# Patient Record
Sex: Female | Born: 1935
Health system: Southern US, Community
[De-identification: ages and names within clinical notes are randomized; demographics above are authoritative.]

## PROBLEM LIST (undated history)

## (undated) DIAGNOSIS — I252 Old myocardial infarction: Secondary | ICD-10-CM

## (undated) DIAGNOSIS — J301 Allergic rhinitis due to pollen: Secondary | ICD-10-CM

## (undated) DIAGNOSIS — I4891 Unspecified atrial fibrillation: Secondary | ICD-10-CM

## (undated) DIAGNOSIS — I2510 Atherosclerotic heart disease of native coronary artery without angina pectoris: Secondary | ICD-10-CM

## (undated) DIAGNOSIS — L509 Urticaria, unspecified: Secondary | ICD-10-CM

## (undated) DIAGNOSIS — I10 Essential (primary) hypertension: Secondary | ICD-10-CM

## (undated) DIAGNOSIS — I5032 Chronic diastolic (congestive) heart failure: Secondary | ICD-10-CM

## (undated) DIAGNOSIS — I480 Paroxysmal atrial fibrillation: Secondary | ICD-10-CM

## (undated) DIAGNOSIS — M1710 Unilateral primary osteoarthritis, unspecified knee: Secondary | ICD-10-CM

## (undated) DIAGNOSIS — I872 Venous insufficiency (chronic) (peripheral): Secondary | ICD-10-CM

## (undated) DIAGNOSIS — M169 Osteoarthritis of hip, unspecified: Secondary | ICD-10-CM

## (undated) DIAGNOSIS — L409 Psoriasis, unspecified: Secondary | ICD-10-CM

## (undated) DIAGNOSIS — H547 Unspecified visual loss: Secondary | ICD-10-CM

## (undated) DIAGNOSIS — I509 Heart failure, unspecified: Secondary | ICD-10-CM

## (undated) HISTORY — PX: REPLACEMENT TOTAL KNEE: SUR1224

## (undated) HISTORY — PX: APPENDECTOMY: SHX54

## (undated) HISTORY — DX: Chronic diastolic (congestive) heart failure: I50.32

## (undated) HISTORY — DX: Venous insufficiency (chronic) (peripheral): I87.2

## (undated) HISTORY — PX: FRACTURE SURGERY: SHX138

## (undated) HISTORY — DX: Paroxysmal atrial fibrillation: I48.0

## (undated) HISTORY — DX: Unspecified atrial fibrillation: I48.91

## (undated) HISTORY — DX: Urticaria, unspecified: L50.9

## (undated) HISTORY — DX: Old myocardial infarction: I25.2

## (undated) HISTORY — PX: REVISION TOTAL HIP ARTHROPLASTY: SHX766

## (undated) HISTORY — PX: OTHER SURGICAL HISTORY: SHX169

## (undated) HISTORY — DX: Unspecified visual loss: H54.7

## (undated) HISTORY — DX: Essential (primary) hypertension: I10

## (undated) HISTORY — DX: Osteoarthritis of hip, unspecified: M16.9

## (undated) HISTORY — DX: Atherosclerotic heart disease of native coronary artery without angina pectoris: I25.10

## (undated) HISTORY — DX: Psoriasis, unspecified: L40.9

## (undated) HISTORY — DX: Allergic rhinitis due to pollen: J30.1

## (undated) HISTORY — PX: PACEMAKER INSERTION: SHX728

## (undated) HISTORY — PX: LAPAROSCOPIC LYSIS OF ADHESIONS: SHX5905

## (undated) HISTORY — DX: Heart failure, unspecified: I50.9

## (undated) HISTORY — DX: Unilateral primary osteoarthritis, unspecified knee: M17.10

---

## 2008-07-09 DIAGNOSIS — I252 Old myocardial infarction: Secondary | ICD-10-CM

## 2008-07-09 HISTORY — DX: Old myocardial infarction: I25.2

## 2017-07-09 DIAGNOSIS — J209 Acute bronchitis, unspecified: Secondary | ICD-10-CM | POA: Diagnosis not present

## 2017-08-13 DIAGNOSIS — I252 Old myocardial infarction: Secondary | ICD-10-CM | POA: Diagnosis not present

## 2017-08-13 DIAGNOSIS — R0989 Other specified symptoms and signs involving the circulatory and respiratory systems: Secondary | ICD-10-CM | POA: Diagnosis not present

## 2017-08-14 ENCOUNTER — Telehealth: Payer: Self-pay | Admitting: *Deleted

## 2017-08-14 NOTE — Telephone Encounter (Signed)
REFERRAL SENT TO SCHEDULING.  °

## 2017-09-12 DIAGNOSIS — J301 Allergic rhinitis due to pollen: Secondary | ICD-10-CM

## 2017-09-12 DIAGNOSIS — M1710 Unilateral primary osteoarthritis, unspecified knee: Secondary | ICD-10-CM | POA: Insufficient documentation

## 2017-09-12 DIAGNOSIS — H547 Unspecified visual loss: Secondary | ICD-10-CM | POA: Insufficient documentation

## 2017-09-12 DIAGNOSIS — I10 Essential (primary) hypertension: Secondary | ICD-10-CM | POA: Insufficient documentation

## 2017-09-12 DIAGNOSIS — M169 Osteoarthritis of hip, unspecified: Secondary | ICD-10-CM | POA: Insufficient documentation

## 2017-09-12 DIAGNOSIS — I5032 Chronic diastolic (congestive) heart failure: Secondary | ICD-10-CM

## 2017-09-12 DIAGNOSIS — I872 Venous insufficiency (chronic) (peripheral): Secondary | ICD-10-CM | POA: Insufficient documentation

## 2017-09-12 DIAGNOSIS — L409 Psoriasis, unspecified: Secondary | ICD-10-CM | POA: Insufficient documentation

## 2017-09-12 DIAGNOSIS — M179 Osteoarthritis of knee, unspecified: Secondary | ICD-10-CM

## 2017-09-12 DIAGNOSIS — L509 Urticaria, unspecified: Secondary | ICD-10-CM

## 2017-09-12 DIAGNOSIS — I5042 Chronic combined systolic (congestive) and diastolic (congestive) heart failure: Secondary | ICD-10-CM | POA: Insufficient documentation

## 2017-09-12 DIAGNOSIS — I480 Paroxysmal atrial fibrillation: Secondary | ICD-10-CM | POA: Insufficient documentation

## 2017-09-12 HISTORY — DX: Unspecified visual loss: H54.7

## 2017-09-12 HISTORY — DX: Psoriasis, unspecified: L40.9

## 2017-09-12 HISTORY — DX: Paroxysmal atrial fibrillation: I48.0

## 2017-09-12 HISTORY — DX: Urticaria, unspecified: L50.9

## 2017-09-12 HISTORY — DX: Unilateral primary osteoarthritis, unspecified knee: M17.10

## 2017-09-12 HISTORY — DX: Allergic rhinitis due to pollen: J30.1

## 2017-09-12 HISTORY — DX: Chronic diastolic (congestive) heart failure: I50.32

## 2017-09-12 HISTORY — DX: Osteoarthritis of knee, unspecified: M17.9

## 2017-09-12 HISTORY — DX: Venous insufficiency (chronic) (peripheral): I87.2

## 2017-09-12 HISTORY — DX: Osteoarthritis of hip, unspecified: M16.9

## 2017-09-12 NOTE — Progress Notes (Signed)
Cardiology Office Note   Date:  09/13/2017   ID:  Dana Morrison, DOB 12/13/1923, MRN [id number]  PCP:  Soundra PilonBrake, Andrew R, FNP  Cardiologist:   Charlton HawsPeter Lockie Bothun, MD   No chief complaint on file.     History of Present Illness: Dana Morrison is a 84 y.o. female who presents for consultation regarding CAD. Referred by Meridee ScoreAndy Brake FNP [facility] [place] [facility] Indicates last stent placed January 2016 History of PAF on Multaq. No DAT due to need for anticoagulation with Eliquis Has Had PPM for SSS and is on statin for HLD   Her son is an Psychologist, educationalArt professor at [employer]. Two grand daughters here. She has no chest pain Indicates MI in 2010 with stents and repeat in 2016 Chronic dyspnea. Pacer placed 2015 with Some battery issue requiring revision after a year. She still drives and lives independantly Compliant with meds     Past Medical History:  Diagnosis Date  . Atrial fibrillation (HCC)   . Congestive heart failure (CHF) (HCC)    estimated ejection fraction is 54%; diastolic  . Coronary artery disease   . Hx of myocardial infarction   . Hypertension     Past Surgical History:  Procedure Laterality Date  . APPENDECTOMY    . ARM FRACTURE Left   . FRACTURE SURGERY Bilateral    WRISTS  . LAPAROSCOPIC LYSIS OF ADHESIONS    . PACEMAKER INSERTION    . PLACEMENT OF STENTS     X5  . REPLACEMENT TOTAL KNEE    . REVISION TOTAL HIP ARTHROPLASTY Left      Current Outpatient Medications  Medication Sig Dispense Refill  . apixaban (ELIQUIS) 2.5 MG TABS tablet Take 2.5 mg by mouth 2 (two) times daily.    Marland Kitchen. aspirin 81 MG chewable tablet Chew 1 tablet by mouth daily.    . bumetanide (BUMEX) 1 MG tablet Take 1 mg by mouth daily.    . cetirizine (ZYRTEC) 10 MG tablet Take 10 mg by mouth daily.    . clopidogrel (PLAVIX) 75 MG tablet Take 75 mg by mouth daily.    Marland Kitchen. dronedarone (MULTAQ) 400 MG tablet Take 400 mg by mouth 2 (two) times daily with a meal.    . isosorbide  mononitrate (IMDUR) 30 MG 24 hr tablet Take 30 mg by mouth daily.    . Metoprolol Succinate 25 MG CS24 Take by mouth.    . Multiple Vitamin (MULTIVITAMIN) capsule Take 1 capsule by mouth daily.    . nitroGLYCERIN (NITROSTAT) 0.4 MG SL tablet Place 0.4 mg under the tongue every 5 (five) minutes as needed for chest pain.    . potassium chloride SA (K-DUR,KLOR-CON) 20 MEQ tablet Take 20 mEq by mouth 2 (two) times daily.    . pravastatin (PRAVACHOL) 20 MG tablet Take 20 mg by mouth daily.    Marland Kitchen. Spacer/Aero-Holding Chambers [facility](OPTICHAMBER DIAMOND) MISC See admin instructions.  0   No current facility-administered medications for this visit.     Allergies:   Patient has no known allergies.    Social History:  The patient  reports that she has quit smoking. She does not have any smokeless tobacco history on file. She reports that she drinks alcohol. She reports that she does not use drugs.   Family History:  The patient's family history includes Healthy in her daughter, daughter, daughter, and son; Heart attack in her father and mother; Lung cancer in her brother; Microcephaly in her mother; Other  in her brother.    ROS:  Please see the history of present illness.   Otherwise, review of systems are positive for none.   All other systems are reviewed and negative.    PHYSICAL EXAM: VS:  BP 110/68   Pulse 76   Ht 5\' 7"  (1.702 m)   Wt 191 lb 4 oz (86.8 kg)   BMI 29.95 kg/m  , BMI Body mass index is 29.95 kg/m. Affect appropriate Elderly female  HEENT: normal Neck supple with no adenopathy JVP normal no bruits no thyromegaly Lungs clear with no wheezing and good diaphragmatic motion Heart:  S1/S2 no murmur, no rub, gallop or click PMI normal pacer under left clavicle  Abdomen: benighn, BS positve, no tenderness, no AAA no bruit.  No HSM or HJR Distal pulses intact with no bruits No edema Neuro non-focal Skin warm and dry Post left hip and knee replacement     EKG:  SR rate 76 PR 238  RBBB LAD PVC;s    Recent Labs: No results found for requested labs within last 8760 hours.    Lipid Panel No results found for: CHOL, TRIG, HDL, CHOLHDL, VLDL, LDLCALC, LDLDIRECT    Wt Readings from Last 3 Encounters:  09/13/17 191 lb 4 oz (86.8 kg)      Other studies Reviewed: Additional studies/ records that were reviewed today include: Notes from primary labs and will review notes from [place] .    ASSESSMENT AND PLAN:  1.  CAD no angina continue ASA and lopressor will try to get records from [place] 2. PAF in NSR continue low dose eliquis and multaq 3. HLD on statin labs with primary  4. PPM:  Will see pacer clinic today has Merlin device then f/u with EP 5. Dyspnea:  Not clear what EF is will order echo for RV/LV function especially Given PAF, Pacer and history of MI's    Current medicines are reviewed at length with the patient today.  The patient does not have concerns regarding medicines.  The following changes have been made:  no change  Labs/ tests ordered today include: Echo   Orders Placed This Encounter  Procedures  . EKG 12-Lead  . ECHOCARDIOGRAM COMPLETE     Disposition:   FU with EP next available and me in 3 months      Signed, Charlton Haws, MD  09/13/2017 10:46 AM    [facility] [address] [place], [place], [place]  [address] Phone: [phone]; Fax: [phone]

## 2017-09-13 ENCOUNTER — Ambulatory Visit: Payer: Medicare HMO | Admitting: Cardiovascular Disease

## 2017-09-13 ENCOUNTER — Encounter: Payer: Self-pay | Admitting: Cardiovascular Disease

## 2017-09-13 VITALS — BP 110/68 | HR 76 | Ht 67.0 in | Wt 191.2 lb

## 2017-09-13 DIAGNOSIS — M169 Osteoarthritis of hip, unspecified: Secondary | ICD-10-CM | POA: Diagnosis not present

## 2017-09-13 DIAGNOSIS — I872 Venous insufficiency (chronic) (peripheral): Secondary | ICD-10-CM

## 2017-09-13 DIAGNOSIS — R0600 Dyspnea, unspecified: Secondary | ICD-10-CM | POA: Diagnosis not present

## 2017-09-13 DIAGNOSIS — I480 Paroxysmal atrial fibrillation: Secondary | ICD-10-CM

## 2017-09-13 DIAGNOSIS — J301 Allergic rhinitis due to pollen: Secondary | ICD-10-CM

## 2017-09-13 DIAGNOSIS — M1710 Unilateral primary osteoarthritis, unspecified knee: Secondary | ICD-10-CM

## 2017-09-13 DIAGNOSIS — L409 Psoriasis, unspecified: Secondary | ICD-10-CM | POA: Diagnosis not present

## 2017-09-13 DIAGNOSIS — I10 Essential (primary) hypertension: Secondary | ICD-10-CM | POA: Diagnosis not present

## 2017-09-13 DIAGNOSIS — M179 Osteoarthritis of knee, unspecified: Secondary | ICD-10-CM

## 2017-09-13 DIAGNOSIS — H547 Unspecified visual loss: Secondary | ICD-10-CM | POA: Diagnosis not present

## 2017-09-13 DIAGNOSIS — L509 Urticaria, unspecified: Secondary | ICD-10-CM

## 2017-09-13 DIAGNOSIS — I5032 Chronic diastolic (congestive) heart failure: Secondary | ICD-10-CM

## 2017-09-13 NOTE — Patient Instructions (Addendum)
Medication Instructions:  Your physician recommends that you continue on your current medications as directed. Please refer to the Current Medication list given to you today.  Labwork: NONE  Testing/Procedures: Your physician has requested that you have an echocardiogram. Echocardiography is a painless test that uses sound waves to create images of your heart. It provides your doctor with information about the size and shape of your heart and how well your heart's chambers and valves are working. This procedure takes approximately one hour. There are no restrictions for this procedure.  Follow-Up: Your physician recommends that you schedule a follow-up appointment as new patient to establish with an EP doctor for pacemaker.  Your physician wants you to follow-up in: 3 months with Dr. Eden EmmsNishan.    If you need a refill on your cardiac medications before your next appointment, please call your pharmacy.

## 2017-09-19 DIAGNOSIS — Z9109 Other allergy status, other than to drugs and biological substances: Secondary | ICD-10-CM | POA: Diagnosis not present

## 2017-09-19 DIAGNOSIS — Z0000 Encounter for general adult medical examination without abnormal findings: Secondary | ICD-10-CM | POA: Diagnosis not present

## 2017-09-19 DIAGNOSIS — R198 Other specified symptoms and signs involving the digestive system and abdomen: Secondary | ICD-10-CM | POA: Diagnosis not present

## 2017-09-19 DIAGNOSIS — R202 Paresthesia of skin: Secondary | ICD-10-CM | POA: Diagnosis not present

## 2017-09-19 DIAGNOSIS — I4891 Unspecified atrial fibrillation: Secondary | ICD-10-CM | POA: Diagnosis not present

## 2017-09-19 DIAGNOSIS — E782 Mixed hyperlipidemia: Secondary | ICD-10-CM | POA: Diagnosis not present

## 2017-09-19 DIAGNOSIS — I252 Old myocardial infarction: Secondary | ICD-10-CM | POA: Diagnosis not present

## 2017-09-19 DIAGNOSIS — I10 Essential (primary) hypertension: Secondary | ICD-10-CM | POA: Diagnosis not present

## 2017-09-19 DIAGNOSIS — I509 Heart failure, unspecified: Secondary | ICD-10-CM | POA: Diagnosis not present

## 2017-09-20 ENCOUNTER — Ambulatory Visit (HOSPITAL_COMMUNITY): Payer: Medicare HMO | Attending: Cardiovascular Disease

## 2017-09-20 ENCOUNTER — Other Ambulatory Visit: Payer: Self-pay

## 2017-09-20 DIAGNOSIS — I4510 Unspecified right bundle-branch block: Secondary | ICD-10-CM | POA: Insufficient documentation

## 2017-09-20 DIAGNOSIS — I110 Hypertensive heart disease with heart failure: Secondary | ICD-10-CM | POA: Diagnosis not present

## 2017-09-20 DIAGNOSIS — Z0000 Encounter for general adult medical examination without abnormal findings: Secondary | ICD-10-CM | POA: Diagnosis not present

## 2017-09-20 DIAGNOSIS — I509 Heart failure, unspecified: Secondary | ICD-10-CM | POA: Insufficient documentation

## 2017-09-20 DIAGNOSIS — I4891 Unspecified atrial fibrillation: Secondary | ICD-10-CM | POA: Diagnosis not present

## 2017-09-20 DIAGNOSIS — I480 Paroxysmal atrial fibrillation: Secondary | ICD-10-CM | POA: Insufficient documentation

## 2017-09-20 DIAGNOSIS — E782 Mixed hyperlipidemia: Secondary | ICD-10-CM | POA: Diagnosis not present

## 2017-09-20 DIAGNOSIS — R0600 Dyspnea, unspecified: Secondary | ICD-10-CM | POA: Diagnosis not present

## 2017-09-20 DIAGNOSIS — I2510 Atherosclerotic heart disease of native coronary artery without angina pectoris: Secondary | ICD-10-CM | POA: Diagnosis not present

## 2017-09-20 DIAGNOSIS — I10 Essential (primary) hypertension: Secondary | ICD-10-CM | POA: Diagnosis not present

## 2017-09-20 DIAGNOSIS — I252 Old myocardial infarction: Secondary | ICD-10-CM | POA: Diagnosis not present

## 2017-09-20 MED ORDER — PERFLUTREN LIPID MICROSPHERE
1.0000 mL | INTRAVENOUS | Status: AC | PRN
  Administered 2017-09-20: 3 mL via INTRAVENOUS

## 2017-09-23 ENCOUNTER — Ambulatory Visit (INDEPENDENT_AMBULATORY_CARE_PROVIDER_SITE_OTHER): Payer: Medicare HMO | Admitting: *Deleted

## 2017-09-23 ENCOUNTER — Encounter: Payer: Self-pay | Admitting: Internal Medicine

## 2017-09-23 ENCOUNTER — Ambulatory Visit: Payer: Medicare HMO | Admitting: Internal Medicine

## 2017-09-23 ENCOUNTER — Other Ambulatory Visit: Payer: Self-pay

## 2017-09-23 VITALS — BP 126/66 | HR 72 | Ht 67.0 in | Wt 192.0 lb

## 2017-09-23 DIAGNOSIS — Z950 Presence of cardiac pacemaker: Secondary | ICD-10-CM | POA: Diagnosis not present

## 2017-09-23 DIAGNOSIS — I495 Sick sinus syndrome: Secondary | ICD-10-CM

## 2017-09-23 DIAGNOSIS — R202 Paresthesia of skin: Secondary | ICD-10-CM

## 2017-09-23 DIAGNOSIS — I480 Paroxysmal atrial fibrillation: Secondary | ICD-10-CM

## 2017-09-23 LAB — CUP PACEART INCLINIC DEVICE CHECK
Battery Remaining Longevity: 140 mo
Battery Voltage: 2.99 V
Brady Statistic RA Percent Paced: 12 %
Brady Statistic RV Percent Paced: 3.1 %
Date Time Interrogation Session: 20190318133639
Implantable Lead Implant Date: 20161230
Implantable Lead Implant Date: 20161230
Implantable Lead Location: 753859
Implantable Lead Location: 753860
Implantable Pulse Generator Implant Date: 20161230
Lead Channel Impedance Value: 475 Ohm
Lead Channel Impedance Value: 575 Ohm
Lead Channel Pacing Threshold Amplitude: 1 V
Lead Channel Pacing Threshold Amplitude: 1 V
Lead Channel Pacing Threshold Amplitude: 1.25 V
Lead Channel Pacing Threshold Amplitude: 1.25 V
Lead Channel Pacing Threshold Pulse Width: 0.4 ms
Lead Channel Pacing Threshold Pulse Width: 0.4 ms
Lead Channel Pacing Threshold Pulse Width: 0.4 ms
Lead Channel Pacing Threshold Pulse Width: 0.4 ms
Lead Channel Sensing Intrinsic Amplitude: 2.1 mV
Lead Channel Sensing Intrinsic Amplitude: 4.2 mV
Lead Channel Setting Pacing Amplitude: 2 V
Lead Channel Setting Pacing Amplitude: 2.5 V
Lead Channel Setting Pacing Pulse Width: 0.4 ms
Lead Channel Setting Sensing Sensitivity: 1 mV
Pulse Gen Model: 2240
Pulse Gen Serial Number: 7845839

## 2017-09-23 NOTE — Addendum Note (Signed)
Addended by: Micki RileySHOFFNER, Ambri Miltner C on: 09/23/2017 04:01 PM   Modules accepted: Orders

## 2017-09-23 NOTE — Patient Instructions (Signed)
Medication Instructions:  Your physician recommends that you continue on your current medications as directed. Please refer to the Current Medication list given to you today.  Labwork: None ordered.  Testing/Procedures: None ordered.  Follow-Up: Your physician wants you to follow-up in: one year with Dr. Ladona Ridgelaylor.   You will receive a reminder letter in the mail two months in advance. If you don't receive a letter, please call our office to schedule the follow-up appointment.  Remote monitoring is used to monitor your Pacemaker from home. This monitoring reduces the number of office visits required to check your device to one time per year. It allows us to keep an eye on the functioning of your device to ensure it is working properly. You are scheduled for a device check from home on 12/23/2017. You may send your transmission at any time that day. If you have a wireless device, the transmission will be sent automatically. After your physician reviews your transmission, you will receive a postcard with your next transmission date.  Any Other Special Instructions Will Be Listed Below (If Applicable).  If you need a refill on your cardiac medications before your next appointment, please call your pharmacy.

## 2017-09-23 NOTE — Progress Notes (Signed)
HPI Dana Morrison is referred today by Dr. Eden EmmsNishan for evaluation and management of her PPM andClinton Quant PAF. She is a pleasant 84 yo woman who moved from [place] about 3 months ago. She has done well and lived independently. She denies chest pain or sob. No syncope and no palpitations. She has a h/o chronotropic incontinence and underwent PPM insertion about 3 years ago. She takes dronenderone for PAF.  Allergies  Allergen Reactions  . Psyllium Diarrhea  . Gluten Meal     Per patient and her son, the need for a gluten free diet is due to some intolerances that she has had for many years. It is not an allergy.   . Other     Per patient she has an intolerance to some legumes. She does not have a true allergy. Pt's son confirmed this as well.      Current Outpatient Medications  Medication Sig Dispense Refill  . apixaban (ELIQUIS) 2.5 MG TABS tablet Take 2.5 mg by mouth 2 (two) times daily.    Marland Kitchen. aspirin 81 MG chewable tablet Chew 1 tablet by mouth daily.    . bumetanide (BUMEX) 1 MG tablet Take 1 mg by mouth daily.    . cetirizine (ZYRTEC) 10 MG tablet Take 10 mg by mouth daily.    . clopidogrel (PLAVIX) 75 MG tablet Take 75 mg by mouth daily.    Marland Kitchen. dronedarone (MULTAQ) 400 MG tablet Take 400 mg by mouth 2 (two) times daily with a meal.    . isosorbide mononitrate (IMDUR) 30 MG 24 hr tablet Take 30 mg by mouth daily.    . Metoprolol Succinate 25 MG CS24 Take by mouth.    . montelukast (SINGULAIR) 10 MG tablet Take 10 mg by mouth daily.  0  . Multiple Vitamin (MULTIVITAMIN) capsule Take 1 capsule by mouth daily.    . nitroGLYCERIN (NITROSTAT) 0.4 MG SL tablet Place 0.4 mg under the tongue every 5 (five) minutes as needed for chest pain.    . potassium chloride SA (K-DUR,KLOR-CON) 20 MEQ tablet Take 20 mEq by mouth 2 (two) times daily.    . pravastatin (PRAVACHOL) 20 MG tablet Take 20 mg by mouth daily.    Marland Kitchen. Spacer/Aero-Holding Chambers [facility](OPTICHAMBER DIAMOND) MISC See admin instructions.  0    No current facility-administered medications for this visit.      Past Medical History:  Diagnosis Date  . Atrial fibrillation (HCC)   . Chronic diastolic heart failure (HCC) 09/12/2017  . Congestive heart failure (CHF) (HCC)    estimated ejection fraction is 54%; diastolic  . Coronary artery disease   . Decreased vision 09/12/2017  . Hx of myocardial infarction 2010  . Hypertension   . Osteoarthritis of hip 09/12/2017  . Osteoarthritis of knee 09/12/2017  . Paroxysmal atrial fibrillation (HCC) 09/12/2017  . Scalp psoriasis 09/12/2017  . Seasonal allergic rhinitis due to pollen 09/12/2017  . Urticaria 09/12/2017  . Venous insufficiency 09/12/2017    ROS:   All systems reviewed and negative except as noted in the HPI.   Past Surgical History:  Procedure Laterality Date  . APPENDECTOMY    . ARM FRACTURE Left   . FRACTURE SURGERY Bilateral    WRISTS  . LAPAROSCOPIC LYSIS OF ADHESIONS    . PACEMAKER INSERTION    . PLACEMENT OF STENTS     X5  . REPLACEMENT TOTAL KNEE    . REVISION TOTAL HIP ARTHROPLASTY Left      Family  History  Problem Relation Age of Onset  . Microcephaly Mother   . Heart attack Mother   . Heart attack Father   . Lung cancer Brother   . Other Brother        BACK PROBLEMS  . Healthy Daughter   . Healthy Daughter   . Healthy Daughter   . Healthy Son      Social History   Socioeconomic History  . Marital status: Widowed    Spouse name: Not on file  . Number of children: Not on file  . Years of education: Not on file  . Highest education level: Not on file  Social Needs  . Financial resource strain: Not on file  . Food insecurity - worry: Not on file  . Food insecurity - inability: Not on file  . Transportation needs - medical: Not on file  . Transportation needs - non-medical: Not on file  Occupational History  . Not on file  Tobacco Use  . Smoking status: Former Games developer  . Smokeless tobacco: Never Used  Substance and Sexual Activity  .  Alcohol use: Yes    Comment: RARE  . Drug use: No  . Sexual activity: No  Other Topics Concern  . Not on file  Social History Narrative  . Not on file     BP 126/66   Pulse 72   Ht 5\' 7"  (1.702 m)   Wt 192 lb (87.1 kg)   BMI 30.07 kg/m   Physical Exam:  Well appearing 84 yo woman, NAD HEENT: Unremarkable Neck:  7 cm JVD, no thyromegally Lymphatics:  No adenopathy Back:  No CVA tenderness Lungs:  Clear with no wheezes HEART:  Regular rate rhythm, no murmurs, no rubs, no clicks Abd:  soft, positive bowel sounds, no organomegally, no rebound, no guarding Ext:  2 plus pulses, no edema, no cyanosis, no clubbing Skin:  No rashes no nodules Neuro:  CN II through XII intact, motor grossly intact  EKG - NSR with first degree AV block and RBBB  DEVICE  Normal device function.  See PaceArt for details.   Assess/Plan: 1. Sinus node dysfunction - she is s/p PPM and is asymptomatic. 2. PAF - she is maintaining NSR over 98% of the time. She will continue on dronenderone. 3. PPM - her St. Jude device is working normally. Will recheck in several months.  Leonia Reeves.D.

## 2017-09-23 NOTE — Progress Notes (Signed)
Pacemaker check in clinic. Normal device function. Thresholds, sensing, impedances consistent with previous measurements. Device programmed to maximize longevity. 1.9% AT/AF- known PAF, on Eliquis. 1 high ventricular rate- 1:1 @ 155bpm, 66 sec. Device programmed at appropriate safety margins, outputs set per policy. Histogram distribution appropriate for patient activity level. Device programmed to optimize intrinsic conduction. Estimated longevity 9.7-11.7 years. Patient enrolled in remote follow-up. Merlin 12/23/2017, ROV with GT in 1 yr. Merlin transfer requested.

## 2017-11-15 ENCOUNTER — Ambulatory Visit (HOSPITAL_COMMUNITY)
Admission: RE | Admit: 2017-11-15 | Discharge: 2017-11-15 | Disposition: A | Discharge status: Home / Self Care | Payer: Medicare HMO | Source: Ambulatory Visit | Attending: Vascular Surgery | Admitting: Vascular Surgery

## 2017-11-15 ENCOUNTER — Ambulatory Visit: Payer: Medicare HMO | Admitting: Vascular Surgery

## 2017-11-15 ENCOUNTER — Encounter: Payer: Self-pay | Admitting: Vascular Surgery

## 2017-11-15 ENCOUNTER — Other Ambulatory Visit: Payer: Self-pay

## 2017-11-15 VITALS — BP 116/64 | HR 72 | Temp 97.7°F | Resp 20 | Ht 67.0 in | Wt 189.0 lb

## 2017-11-15 DIAGNOSIS — R600 Localized edema: Secondary | ICD-10-CM | POA: Diagnosis not present

## 2017-11-15 DIAGNOSIS — R202 Paresthesia of skin: Secondary | ICD-10-CM | POA: Insufficient documentation

## 2017-11-15 NOTE — Progress Notes (Signed)
Patient ID: Dana Morrison, female   DOB: 1924/02/07, 84 y.o.   MRN: [id number]  Reason for Consult: New Patient (Initial Visit) (Ref by Meridee Score, NP.  [phone].  Bilat below the knee tingling and numbness. )   Referred by Soundra Pilon, FNP  Subjective:     HPI:  Dana Morrison is a 84 y.o. female sent for evaluation of bilateral lower extremity numbness and tingling that is gotten progressively worse over the past several months.  She is recently moved from [place].  She has a history of a left knee replacement.  She does not have any previous vascular intervention on her lower extremity but does have previous history of myocardial infarction for which she has chronic shortness of breath which limits her activity.  She does attempt to elevate her legs when she is recumbent given that her leg swelling is also an issue.  She is never had vein stripping on either of her legs or other venous interventions.  She does have a diagnosis of congestive heart failure for which she does take a diuretic daily which helps minimally with her swelling.  She does not have any tissue loss or ulceration.  She does have spider veins which she does not care about the cosmesis of the level of her ankles.  She is able to walk with the help of a walker.  She does take aspirin as well as Eliquis for atrial fibrillation.  Past Medical History:  Diagnosis Date  . Atrial fibrillation (HCC)   . Chronic diastolic heart failure (HCC) 09/12/2017  . Congestive heart failure (CHF) (HCC)    estimated ejection fraction is 54%; diastolic  . Coronary artery disease   . Decreased vision 09/12/2017  . Hx of myocardial infarction 2010  . Hypertension   . Osteoarthritis of hip 09/12/2017  . Osteoarthritis of knee 09/12/2017  . Paroxysmal atrial fibrillation (HCC) 09/12/2017  . Scalp psoriasis 09/12/2017  . Seasonal allergic rhinitis due to pollen 09/12/2017  . Urticaria 09/12/2017  . Venous insufficiency 09/12/2017   Family History    Problem Relation Age of Onset  . Microcephaly Mother   . Heart attack Mother   . Heart attack Father   . Lung cancer Brother   . Other Brother        BACK PROBLEMS  . Healthy Daughter   . Healthy Daughter   . Healthy Daughter   . Healthy Son    Past Surgical History:  Procedure Laterality Date  . APPENDECTOMY    . ARM FRACTURE Left   . FRACTURE SURGERY Bilateral    WRISTS  . LAPAROSCOPIC LYSIS OF ADHESIONS    . PACEMAKER INSERTION    . PLACEMENT OF STENTS     X5  . REPLACEMENT TOTAL KNEE    . REVISION TOTAL HIP ARTHROPLASTY Left     Short Social History:  Social History   Tobacco Use  . Smoking status: Former Games developer  . Smokeless tobacco: Never Used  Substance Use Topics  . Alcohol use: Yes    Comment: RARE    Allergies  Allergen Reactions  . Psyllium Diarrhea  . Gluten Meal     Per patient and her son, the need for a gluten free diet is due to some intolerances that she has had for many years. It is not an allergy.   . Other     Per patient she has an intolerance to some legumes. She does not have a true allergy. Pt's son confirmed this  as well.     Current Outpatient Medications  Medication Sig Dispense Refill  . apixaban (ELIQUIS) 2.5 MG TABS tablet Take 2.5 mg by mouth 2 (two) times daily.    Marland Kitchen aspirin 81 MG chewable tablet Chew 1 tablet by mouth daily.    . bumetanide (BUMEX) 1 MG tablet Take 1 mg by mouth daily.    . clopidogrel (PLAVIX) 75 MG tablet Take 75 mg by mouth daily.    Marland Kitchen dronedarone (MULTAQ) 400 MG tablet Take 400 mg by mouth 2 (two) times daily with a meal.    . isosorbide mononitrate (IMDUR) 30 MG 24 hr tablet Take 30 mg by mouth daily.    . Metoprolol Succinate 25 MG CS24 Take by mouth.    . montelukast (SINGULAIR) 10 MG tablet Take 10 mg by mouth daily.  0  . Multiple Vitamin (MULTIVITAMIN) capsule Take 1 capsule by mouth daily.    . nitroGLYCERIN (NITROSTAT) 0.4 MG SL tablet Place 0.4 mg under the tongue every 5 (five) minutes as needed  for chest pain.    . potassium chloride SA (K-DUR,KLOR-CON) 20 MEQ tablet Take 20 mEq by mouth 2 (two) times daily.    . pravastatin (PRAVACHOL) 20 MG tablet Take 20 mg by mouth daily.    Marland Kitchen Spacer/Aero-Holding Chambers [facility] DIAMOND) MISC See admin instructions.  0   No current facility-administered medications for this visit.     Review of Systems  Constitutional:  Constitutional negative. HENT: HENT negative.  Eyes: Eyes negative.  Respiratory: Positive for shortness of breath.  Cardiovascular: Positive for irregular heartbeat.  GI: Gastrointestinal negative.  Musculoskeletal: Positive for leg pain.  Neurological: Positive for numbness.  Hematologic: Positive for bruises/bleeds easily.  Psychiatric: Psychiatric negative.        Objective:  Objective   Vitals:   11/15/17 0932  BP: 116/64  Pulse: 72  Resp: 20  Temp: 97.7 F (36.5 C)  TempSrc: Oral  SpO2: 98%  Weight: 189 lb (85.7 kg)  Height:  (1.702 m)   Body mass index is 29.6 kg/m.  Physical Exam  Constitutional: She is oriented to person, place, and time. She appears well-developed.  HENT:  Head: Normocephalic.  Eyes: Pupils are equal, round, and reactive to light.  Neck: Normal range of motion.  Cardiovascular:  Pulses:      Popliteal pulses are 2+ on the right side, and 2+ on the left side.       Dorsalis pedis pulses are 2+ on the right side, and 2+ on the left side.       Posterior tibial pulses are 2+ on the right side, and 2+ on the left side.  Pulmonary/Chest:  Shortness of breath, no home O2  Abdominal: Soft. She exhibits no mass.  Musculoskeletal: She exhibits edema.  1+ pitting edema, left > right Spider veins at ankles  Neurological: She is alert and oriented to person, place, and time.  Skin: Skin is warm and dry. Capillary refill takes less than 2 seconds. No erythema.  Psychiatric: She has a normal mood and affect. Her behavior is normal. Judgment and thought content normal.     Data: I have independently interpreted her ABIs to be triphasic right 0.98 left is 1.09 with toe pressures on the right 141 left 160     Assessment/Plan:     84 year old female with history of coronary artery disease congestive heart failure but remains active walking with the help of walker.  She has numbness and tingling in her  bilateral lower extremities with associated edema which she does take a diuretic for.  This time I think conservative measures are best for her including elevation of her legs when recumbent we discussed this in detail.  She has difficulty with compression stockings with any gentle compression would be helpful.  Activity would also be helpful for her.  From an arterial standpoint she appears to be intact and does not need any intervention.  She can follow-up on a as needed basis.     Maeola Harman MD [facility]

## 2017-12-09 ENCOUNTER — Encounter: Payer: Self-pay | Admitting: Cardiovascular Disease

## 2017-12-09 ENCOUNTER — Ambulatory Visit: Payer: Medicare HMO | Admitting: Cardiovascular Disease

## 2017-12-09 ENCOUNTER — Encounter (INDEPENDENT_AMBULATORY_CARE_PROVIDER_SITE_OTHER): Payer: Self-pay

## 2017-12-09 VITALS — BP 112/84 | HR 71 | Ht 67.0 in | Wt 190.5 lb

## 2017-12-09 DIAGNOSIS — R0600 Dyspnea, unspecified: Secondary | ICD-10-CM | POA: Diagnosis not present

## 2017-12-09 DIAGNOSIS — I480 Paroxysmal atrial fibrillation: Secondary | ICD-10-CM

## 2017-12-09 DIAGNOSIS — I2510 Atherosclerotic heart disease of native coronary artery without angina pectoris: Secondary | ICD-10-CM

## 2017-12-09 DIAGNOSIS — E785 Hyperlipidemia, unspecified: Secondary | ICD-10-CM | POA: Diagnosis not present

## 2017-12-09 NOTE — Progress Notes (Signed)
Cardiology Office Note   Date:  12/09/2017   ID:  Dana Morrison, DOB 06/01/1924, MRN [id number]  PCP:  Soundra PilonBrake, Andrew R, FNP  Cardiologist:   Charlton HawsPeter Khoa Opdahl, MD   No chief complaint on file.     History of Present Illness:  84 y.o. from [place]. Stent January 2016, PAF , PPM and HLD  Her son is an Engineer, drillingArt professor at [employer]. Two grand daughters here. She has no chest pain Indicates MI in 2010 with stents and repeat in 2016 Chronic dyspnea. Pacer placed 2015 has seen dr Ladona Ridgelaylor for pacer check  She still drives and lives independently  Compliant with meds   TTE 09/20/17  EF 55-60%  Estimated PA 35 mmHg no significant valve disease   Some LE edema dependant worse on RLE recent arterial doppler normal   Past Medical History:  Diagnosis Date  . Atrial fibrillation (HCC)   . Chronic diastolic heart failure (HCC) 09/12/2017  . Congestive heart failure (CHF) (HCC)    estimated ejection fraction is 54%; diastolic  . Coronary artery disease   . Decreased vision 09/12/2017  . Hx of myocardial infarction 2010  . Hypertension   . Osteoarthritis of hip 09/12/2017  . Osteoarthritis of knee 09/12/2017  . Paroxysmal atrial fibrillation (HCC) 09/12/2017  . Scalp psoriasis 09/12/2017  . Seasonal allergic rhinitis due to pollen 09/12/2017  . Urticaria 09/12/2017  . Venous insufficiency 09/12/2017    Past Surgical History:  Procedure Laterality Date  . APPENDECTOMY    . ARM FRACTURE Left   . FRACTURE SURGERY Bilateral    WRISTS  . LAPAROSCOPIC LYSIS OF ADHESIONS    . PACEMAKER INSERTION    . PLACEMENT OF STENTS     X5  . REPLACEMENT TOTAL KNEE    . REVISION TOTAL HIP ARTHROPLASTY Left      Current Outpatient Medications  Medication Sig Dispense Refill  . apixaban (ELIQUIS) 2.5 MG TABS tablet Take 2.5 mg by mouth 2 (two) times daily.    Marland Kitchen. aspirin 81 MG chewable tablet Chew 1 tablet by mouth daily.    . bumetanide (BUMEX) 1 MG tablet Take 1 mg by mouth daily.    . clopidogrel (PLAVIX) 75 MG tablet  Take 75 mg by mouth daily.    Marland Kitchen. dronedarone (MULTAQ) 400 MG tablet Take 400 mg by mouth 2 (two) times daily with a meal.    . isosorbide mononitrate (IMDUR) 30 MG 24 hr tablet Take 30 mg by mouth daily.    . Metoprolol Succinate 25 MG CS24 Take 1 tablet by mouth daily.    . montelukast (SINGULAIR) 10 MG tablet Take 10 mg by mouth daily.  0  . Multiple Vitamin (MULTIVITAMIN) capsule Take 1 capsule by mouth daily.    . nitroGLYCERIN (NITROSTAT) 0.4 MG SL tablet Place 0.4 mg under the tongue every 5 (five) minutes as needed for chest pain.    . potassium chloride SA (K-DUR,KLOR-CON) 20 MEQ tablet Take 20 mEq by mouth 2 (two) times daily.    . pravastatin (PRAVACHOL) 20 MG tablet Take 20 mg by mouth daily.     No current facility-administered medications for this visit.     Allergies:   Psyllium; Gluten meal; and Other    Social History:  The patient  reports that she has quit smoking. She has never used smokeless tobacco. She reports that she drinks alcohol. She reports that she does not use drugs.   Family History:  The patient's family history includes Healthy  in her daughter, daughter, daughter, and son; Heart attack in her father and mother; Lung cancer in her brother; Microcephaly in her mother; Other in her brother.    ROS:  Please see the history of present illness.   Otherwise, review of systems are positive for none.   All other systems are reviewed and negative.    PHYSICAL EXAM: VS:  BP 112/84   Pulse 71   Ht 5\' 7"  (1.702 m)   Wt 190 lb 8 oz (86.4 kg)   SpO2 95%   BMI 29.84 kg/m  , BMI Body mass index is 29.84 kg/m. Affect appropriate Elderly female  HEENT: normal Neck supple with no adenopathy JVP normal no bruits no thyromegaly Lungs clear with no wheezing and good diaphragmatic motion Heart:  S1/S2 no murmur, no rub, gallop or click PMI normal pacer under left clavicle  Abdomen: benighn, BS positve, no tenderness, no AAA no bruit.  No HSM or HJR Distal pulses  intact with no bruits Plus 2 on right edema plus one on left  Neuro non-focal Skin warm and dry Post left hip and knee replacement     EKG:  SR rate 76 PR 238 RBBB LAD PVC;s    Recent Labs: No results found for requested labs within last 8760 hours.    Lipid Panel No results found for: CHOL, TRIG, HDL, CHOLHDL, VLDL, LDLCALC, LDLDIRECT    Wt Readings from Last 3 Encounters:  12/09/17 190 lb 8 oz (86.4 kg)  11/15/17 189 lb (85.7 kg)  09/23/17 192 lb (87.1 kg)      Other studies Reviewed: Additional studies/ records that were reviewed today include: Notes from primary labs and will review notes from [place] .    ASSESSMENT AND PLAN:  1.  CAD no angina continue ASA and lopressor   2. PAF in NSR continue low dose eliquis and multaq 3. HLD on statin labs with primary  4. PPM:  Normal pacer function f/u Dr Ladona Ridgel  5. Dyspnea:  No cardiac EF 55-60% TTE 09/20/17  6. Edema:  Right worse than left Bumex as needed extra [place] r/o DVT on right   Current medicines are reviewed at length with the patient today.  The patient does not have concerns regarding medicines.  The following changes have been made:  no change   No orders of the defined types were placed in this encounter.    Disposition:   FU with EP next available and me in 3 months      Signed, Charlton Haws, MD  12/09/2017 11:29 AM    [facility] [address] [place], [place], [place]  [address] Phone: [phone]; Fax: [phone]

## 2017-12-09 NOTE — Patient Instructions (Addendum)
Medication Instructions:  Your physician recommends that you continue on your current medications as directed. Please refer to the Current Medication list given to you today.  Labwork: NONE  Testing/Procedures: NONE  Follow-Up: Your physician wants you to follow-up in: 15 months with Dr. Eden EmmsNishan. You will receive a reminder letter in the mail two months in advance. If you don't receive a letter, please call our office to schedule the follow-up appointment.   If you need a refill on your cardiac medications before your next appointment, please call your pharmacy.

## 2017-12-23 ENCOUNTER — Encounter: Payer: Medicare HMO | Admitting: *Deleted

## 2018-01-14 ENCOUNTER — Telehealth: Payer: Self-pay | Admitting: Cardiology

## 2018-01-14 ENCOUNTER — Ambulatory Visit (INDEPENDENT_AMBULATORY_CARE_PROVIDER_SITE_OTHER): Payer: Medicare HMO | Admitting: *Deleted

## 2018-01-14 DIAGNOSIS — I495 Sick sinus syndrome: Secondary | ICD-10-CM | POA: Diagnosis not present

## 2018-01-14 NOTE — Telephone Encounter (Signed)
Spoke with pt and reminded pt of remote transmission that is due today. Pt verbalized understanding.   

## 2018-01-15 NOTE — Progress Notes (Signed)
Remote pacemaker transmission.   

## 2018-01-23 ENCOUNTER — Other Ambulatory Visit: Payer: Self-pay

## 2018-01-23 DIAGNOSIS — R198 Other specified symptoms and signs involving the digestive system and abdomen: Secondary | ICD-10-CM | POA: Diagnosis not present

## 2018-01-23 DIAGNOSIS — R6889 Other general symptoms and signs: Secondary | ICD-10-CM | POA: Diagnosis not present

## 2018-01-23 DIAGNOSIS — Z9109 Other allergy status, other than to drugs and biological substances: Secondary | ICD-10-CM | POA: Diagnosis not present

## 2018-01-23 MED ORDER — BUMETANIDE 1 MG PO TABS: 1.0000 mg | ORAL_TABLET | Freq: Every day | ORAL | 3 refills | Status: DC

## 2018-01-28 LAB — CUP PACEART REMOTE DEVICE CHECK
Battery Remaining Longevity: 128 mo
Battery Remaining Percentage: 95.5 %
Battery Voltage: 2.98 V
Brady Statistic AP VP Percent: 1 %
Brady Statistic AP VS Percent: 6.5 %
Brady Statistic AS VP Percent: 1 %
Brady Statistic AS VS Percent: 91 %
Brady Statistic RA Percent Paced: 6.5 %
Brady Statistic RV Percent Paced: 2.7 %
Date Time Interrogation Session: 20190709180739
Implantable Lead Implant Date: 20161230
Implantable Lead Implant Date: 20161230
Implantable Lead Location: 753859
Implantable Lead Location: 753860
Implantable Pulse Generator Implant Date: 20161230
Lead Channel Impedance Value: 460 Ohm
Lead Channel Impedance Value: 550 Ohm
Lead Channel Pacing Threshold Amplitude: 1 V
Lead Channel Pacing Threshold Amplitude: 1.25 V
Lead Channel Pacing Threshold Pulse Width: 0.4 ms
Lead Channel Pacing Threshold Pulse Width: 0.4 ms
Lead Channel Sensing Intrinsic Amplitude: 3 mV
Lead Channel Sensing Intrinsic Amplitude: 6.5 mV
Lead Channel Setting Pacing Amplitude: 2 V
Lead Channel Setting Pacing Amplitude: 2.5 V
Lead Channel Setting Pacing Pulse Width: 0.4 ms
Lead Channel Setting Sensing Sensitivity: 1 mV
Pulse Gen Model: 2240
Pulse Gen Serial Number: 7845839

## 2018-01-30 ENCOUNTER — Other Ambulatory Visit: Payer: Self-pay

## 2018-01-30 MED ORDER — CLOPIDOGREL BISULFATE 75 MG PO TABS: 75.0000 mg | ORAL_TABLET | Freq: Every day | ORAL | 0 refills | Status: DC

## 2018-01-30 MED ORDER — METOPROLOL SUCCINATE 25 MG PO CS24: 1.0000 | EXTENDED_RELEASE_CAPSULE | Freq: Every day | ORAL | 0 refills | Status: DC

## 2018-01-30 MED ORDER — DRONEDARONE HCL 400 MG PO TABS: 400.0000 mg | ORAL_TABLET | Freq: Two times a day (BID) | ORAL | 0 refills | Status: DC

## 2018-01-30 NOTE — Telephone Encounter (Signed)
Eliquis 2.5mg  refill request received; pt is 84 yrs old, wt-86.4kg, last seen by Dr. Eden EmmsNishan on 12/09/17, Crea-1.09 on 05/18/17; will send a note to Dr. Eden EmmsNishan as dose is not appropriate and will ned him to authorized refill.

## 2018-01-31 MED ORDER — APIXABAN 2.5 MG PO TABS: 2.5000 mg | ORAL_TABLET | Freq: Two times a day (BID) | ORAL | 1 refills | Status: DC

## 2018-01-31 MED ORDER — PRAVASTATIN SODIUM 20 MG PO TABS: 20.0000 mg | ORAL_TABLET | Freq: Every day | ORAL | 3 refills | Status: DC

## 2018-01-31 NOTE — Telephone Encounter (Signed)
-----   Message from Wendall StadePeter C Nishan, MD sent at 01/30/2018  4:52 PM EDT ----- She is also on plavix for CAD and prefer low dose   ----- Message ----- From: Kandace ParkinsHemphill, Audray Rumore B, RN Sent: 01/30/2018   4:22 PM To: Wendall StadePeter C Nishan, MD  Dr. Eden EmmsNishan,  Eliquis 2.5mg  refill request received; pt is 84 yrs old, wt-86.4kg, last seen by you on 12/09/17, Crea-1.09 on 05/18/17 per CareEverywhere. Per dosing criteria pt is on incorrect dose. Please advise.  Thanks, Pamala Hurryandance Roen Macgowan, RN

## 2018-01-31 NOTE — Telephone Encounter (Signed)
Received reply from Dr. Eden EmmsNishan regarding Eliquis dose and pt to remain on Eliquis 2.5mg . Will send in refill to requested pharmacy.

## 2018-02-04 ENCOUNTER — Telehealth: Payer: Self-pay | Admitting: *Deleted

## 2018-02-04 ENCOUNTER — Other Ambulatory Visit: Payer: Self-pay | Admitting: Cardiovascular Disease

## 2018-02-04 NOTE — Telephone Encounter (Signed)
-----   Message from Wendall StadePeter C Nishan, MD sent at 01/30/2018  4:52 PM EDT ----- She is also on plavix for CAD and prefer low dose   ----- Message ----- From: Kandace ParkinsHemphill, Malcolm Quast B, RN Sent: 01/30/2018   4:22 PM To: Wendall StadePeter C Nishan, MD  Dr. Eden EmmsNishan,  Eliquis 2.5mg  refill request received; pt is 84 yrs old, wt-86.4kg, last seen by you on 12/09/17, Crea-1.09 on 05/18/17 per CareEverywhere. Per dosing criteria pt is on incorrect dose. Please advise.  Thanks, Pamala Hurryandance Jerline Linzy, RN

## 2018-02-05 ENCOUNTER — Telehealth: Payer: Self-pay | Admitting: Internal Medicine

## 2018-02-05 ENCOUNTER — Other Ambulatory Visit: Payer: Self-pay | Admitting: Cardiovascular Disease

## 2018-02-05 MED ORDER — DRONEDARONE HCL 400 MG PO TABS: 400.0000 mg | ORAL_TABLET | Freq: Two times a day (BID) | ORAL | 0 refills | Status: DC

## 2018-02-05 MED ORDER — DRONEDARONE HCL 400 MG PO TABS: 400.0000 mg | ORAL_TABLET | Freq: Two times a day (BID) | ORAL | 3 refills | Status: DC

## 2018-02-05 MED ORDER — APIXABAN 2.5 MG PO TABS: 2.5000 mg | ORAL_TABLET | Freq: Two times a day (BID) | ORAL | 0 refills | Status: DC

## 2018-02-05 MED ORDER — CLOPIDOGREL BISULFATE 75 MG PO TABS: 75.0000 mg | ORAL_TABLET | Freq: Every day | ORAL | 0 refills | Status: DC

## 2018-02-05 MED ORDER — POTASSIUM CHLORIDE CRYS ER 20 MEQ PO TBCR: 20.0000 meq | EXTENDED_RELEASE_TABLET | Freq: Two times a day (BID) | ORAL | 3 refills | Status: DC

## 2018-02-05 MED ORDER — ISOSORBIDE MONONITRATE ER 30 MG PO TB24: 30.0000 mg | ORAL_TABLET | Freq: Every day | ORAL | 3 refills | Status: DC

## 2018-02-05 MED ORDER — CLOPIDOGREL BISULFATE 75 MG PO TABS: 75.0000 mg | ORAL_TABLET | Freq: Every day | ORAL | 3 refills | Status: DC

## 2018-02-05 MED ORDER — ISOSORBIDE MONONITRATE ER 30 MG PO TB24: 30.0000 mg | ORAL_TABLET | Freq: Every day | ORAL | 0 refills | Status: DC

## 2018-02-05 MED ORDER — POTASSIUM CHLORIDE CRYS ER 20 MEQ PO TBCR: 20.0000 meq | EXTENDED_RELEASE_TABLET | Freq: Two times a day (BID) | ORAL | 0 refills | Status: DC

## 2018-02-05 MED ORDER — METOPROLOL SUCCINATE 25 MG PO CS24: 1.0000 | EXTENDED_RELEASE_CAPSULE | Freq: Every day | ORAL | 0 refills | Status: DC

## 2018-02-05 NOTE — Telephone Encounter (Signed)
Pt needs [facility] to [facility], 2 weeks supply until mail order arrives. Thanks pt's other medications were already sent to pt's pharmacy for 2 weeks supply until mail order arrives. Confirmation received.

## 2018-02-05 NOTE — Telephone Encounter (Signed)
New Message:      *STAT* If patient is at the pharmacy, call can be transferred to refill team.   1. Which medications need to be refilled? (please list name of each medication and dose if known)  apixaban (ELIQUIS) 2.5 MG TABS tablet  clopidogrel (PLAVIX) 75 MG tablet  dronedarone (MULTAQ) 400 MG tablet  Metoprolol in place of [place] sprinkle  isosorbide mononitrate (IMDUR) 30 MG 24 hr tablet   potassium chloride SA (K-DUR,KLOR-CON) 20 MEQ tablet    2. Which pharmacy/location (including street and city if local pharmacy) is medication to be sent to?[id number] [place] [place] - [address]  3. Do they need a 30 day or 90 day supply? Pt states she only 2 wks worth until [facility]

## 2018-02-06 ENCOUNTER — Other Ambulatory Visit: Payer: Self-pay

## 2018-02-06 MED ORDER — METOPROLOL SUCCINATE ER 25 MG PO TB24: 25.0000 mg | ORAL_TABLET | Freq: Every day | ORAL | 3 refills | Status: DC

## 2018-02-06 NOTE — Progress Notes (Signed)
Patient needs metoprolol succinate tablets, not sprinkles.

## 2018-02-27 ENCOUNTER — Ambulatory Visit: Payer: Medicare HMO | Admitting: Allergy

## 2018-02-27 ENCOUNTER — Encounter: Payer: Self-pay | Admitting: Allergy

## 2018-02-27 ENCOUNTER — Encounter (INDEPENDENT_AMBULATORY_CARE_PROVIDER_SITE_OTHER): Payer: Self-pay

## 2018-02-27 VITALS — BP 116/68 | HR 71 | Temp 97.4°F | Resp 17 | Ht 67.0 in | Wt 191.2 lb

## 2018-02-27 DIAGNOSIS — J310 Chronic rhinitis: Secondary | ICD-10-CM | POA: Diagnosis not present

## 2018-02-27 DIAGNOSIS — T781XXA Other adverse food reactions, not elsewhere classified, initial encounter: Secondary | ICD-10-CM

## 2018-02-27 DIAGNOSIS — L299 Pruritus, unspecified: Secondary | ICD-10-CM

## 2018-02-27 MED ORDER — CARBINOXAMINE MALEATE 6 MG PO TABS: 6.0000 mg | ORAL_TABLET | Freq: Two times a day (BID) | ORAL | 1 refills | Status: DC

## 2018-02-27 MED ORDER — AZELASTINE HCL 0.1 % NA SOLN: 2.0000 | Freq: Two times a day (BID) | NASAL | 5 refills | Status: DC

## 2018-02-27 NOTE — Progress Notes (Signed)
New Patient Note  RE: Dana Morrison MRN: [id number] DOB: August 14, 1923 Date of Office Visit: 02/27/2018  Referring provider: Soundra Pilon, FNP Primary care provider: Soundra Pilon, FNP  Chief Complaint: itching and allergies  History of present illness: Dana Morrison is a 84 y.o. female presenting today for consultation for itching and allergies.  She presents today with her daugther-in-law.   She has been itching all over worse on her face for the past year.  She has never noted a rash with the itch.  She also has been coughing due to runny nose, sneezing, itchy eyes.  Symptoms are year-round.  She lives alone.  She has not had any recent travel.  She denies any changes to her medication.  She denies any fevers, night sweats, changes in weight.    She moved from [place] to [place] area in January 2019.  Her previous doctor in [place] for the itch has tried antibiotics, singulair, zyrtec, claritin as well as allegra and xyzal which none of these have helped. She states she lotions with lubriderm 1-2 times a day and soap including oatmeal soap and aloe.  She does not have a history of eczema.   For here nasal drainage in addition to the medications above she has also tried simply saline, flonase, nasacort.  Has not tried any prescription nasal sprays. She states she has also used eyedrops for her itchy eyes.    Over the years has required a restricted diet.  Daguther-in-law thinks she may have IBS.  She did see GI while in [place] which did not reveal particular diagnosis.  She states she has to avoid certain foods as they cause diarrhea that can occur within minutes-hours or even start the following day after ingestion.  The foods she avoids are leafy greens, rice, white flour/wheat flour (potato flour is ok), nuts, berries, black bean/small beans (pinto beans are ok), high fiber foods, apples, banana, kiwi (citrus fruits are ok).     Review of systems: Review of Systems  Constitutional: Negative  for chills, fever, malaise/fatigue and weight loss.  HENT: Positive for congestion. Negative for ear discharge, ear pain, nosebleeds and sore throat.   Eyes: Negative for discharge and redness.  Respiratory: Positive for cough. Negative for sputum production, shortness of breath and wheezing.   Cardiovascular: Negative for chest pain.  Gastrointestinal: Negative for abdominal pain, constipation, diarrhea, nausea and vomiting.  Musculoskeletal: Negative for joint pain.  Skin: Positive for itching. Negative for rash.  Neurological: Negative for headaches.    All other systems negative unless noted above in HPI  Past medical history: Past Medical History:  Diagnosis Date  . Atrial fibrillation (HCC)   . Chronic diastolic heart failure (HCC) 09/12/2017  . Congestive heart failure (CHF) (HCC)    estimated ejection fraction is 54%; diastolic  . Coronary artery disease   . Decreased vision 09/12/2017  . Hx of myocardial infarction 2010  . Hypertension   . Osteoarthritis of hip 09/12/2017  . Osteoarthritis of knee 09/12/2017  . Paroxysmal atrial fibrillation (HCC) 09/12/2017  . Scalp psoriasis 09/12/2017  . Seasonal allergic rhinitis due to pollen 09/12/2017  . Urticaria 09/12/2017  . Venous insufficiency 09/12/2017    Past surgical history: Past Surgical History:  Procedure Laterality Date  . APPENDECTOMY    . ARM FRACTURE Left   . FRACTURE SURGERY Bilateral    WRISTS  . LAPAROSCOPIC LYSIS OF ADHESIONS    . PACEMAKER INSERTION    . PLACEMENT OF STENTS  X5  . REPLACEMENT TOTAL KNEE    . REVISION TOTAL HIP ARTHROPLASTY Left     Family history:  Family History  Problem Relation Age of Onset  . Microcephaly Mother   . Heart attack Mother   . Heart attack Father   . Lung cancer Brother   . Other Brother        BACK PROBLEMS  . Healthy Daughter   . Healthy Daughter   . Healthy Daughter   . Healthy Son     Social history: Lives alone in an apartment with electric heating and  central cooling. No pets in the home.  No concern for water damage, mildew or roaches in the home.  She denies a smoking history.   Medication List: Allergies as of 02/27/2018      Reactions   Psyllium Diarrhea   Gluten Meal    Per patient and her son, the need for a gluten free diet is due to some intolerances that she has had for many years. It is not an allergy.    Other    Per patient she has an intolerance to some legumes. She does not have a true allergy. Pt's son confirmed this as well.       Medication List        Accurate as of 02/27/18  5:37 PM. Always use your most recent med list.          apixaban 2.5 MG Tabs tablet Commonly known as:  ELIQUIS Take 1 tablet (2.5 mg total) by mouth 2 (two) times daily.   aspirin 81 MG chewable tablet Chew 1 tablet by mouth daily.   azelastine 0.1 % nasal spray Commonly known as:  ASTELIN Place 2 sprays into both nostrils 2 (two) times daily.   bumetanide 1 MG tablet Commonly known as:  BUMEX Take 1 tablet (1 mg total) by mouth daily.   Carbinoxamine Maleate 6 MG Tabs Take 6 mg by mouth 2 (two) times daily.   clopidogrel 75 MG tablet Commonly known as:  PLAVIX Take 1 tablet (75 mg total) by mouth daily.   dronedarone 400 MG tablet Commonly known as:  MULTAQ Take 1 tablet (400 mg total) by mouth 2 (two) times daily with a meal.   isosorbide mononitrate 30 MG 24 hr tablet Commonly known as:  IMDUR Take 1 tablet (30 mg total) by mouth daily.   metoprolol succinate 25 MG 24 hr tablet Commonly known as:  TOPROL-XL Take 1 tablet (25 mg total) by mouth daily.   montelukast 10 MG tablet Commonly known as:  SINGULAIR Take 10 mg by mouth daily.   multivitamin capsule Take 1 capsule by mouth daily.   nitroGLYCERIN 0.4 MG SL tablet Commonly known as:  NITROSTAT Place 0.4 mg under the tongue every 5 (five) minutes as needed for chest pain.   potassium chloride SA 20 MEQ tablet Commonly known as:  K-DUR,KLOR-CON Take 1  tablet (20 mEq total) by mouth 2 (two) times daily.   pravastatin 20 MG tablet Commonly known as:  PRAVACHOL Take 1 tablet (20 mg total) by mouth daily.       Known medication allergies: Allergies  Allergen Reactions  . Psyllium Diarrhea  . Gluten Meal     Per patient and her son, the need for a gluten free diet is due to some intolerances that she has had for many years. It is not an allergy.   . Other     Per patient she has an intolerance  to some legumes. She does not have a true allergy. Pt's son confirmed this as well.      Physical examination: Blood pressure 116/68, pulse 71, temperature (!) 97.4 F (36.3 C), temperature source Oral, resp. rate 17, height 5\' 7"  (1.702 m), weight 191 lb 3.2 oz (86.7 kg), SpO2 94 %.  General: Alert, interactive, in no acute distress. HEENT: PERRLA, TMs pearly gray, turbinates minimally edematous with clear discharge, post-pharynx non erythematous. Neck: Supple without lymphadenopathy. Lungs: Clear to auscultation without wheezing, rhonchi or rales. {no increased work of breathing. CV: Normal S1, S2 without murmurs. Abdomen: Nondistended, nontender. Skin: Warm and dry, without lesions or rashes. Extremities:  No clubbing, cyanosis or edema. Neuro:   Grossly intact.  Diagnositics/Labs: None today  Assessment and plan: Pruritus with dermatitis    - itch without rash    - continue daily moisturization 1-2 times a day    - will have you trial Ryvent 1 tab twice a day - take first tablet during day to determine if will cause sedation.  This is an antihistamine    - will obtain labs to determine if any underlying causes for itch  Chronic rhinitis    - as above trial Ryvent    - for nasal drainage/post-nasal drip trial Astelin, 1-2 sprays each nostril up to twice a day    - will obtain environmental allergy panel  Adverse food reaction   - diarrhea following certain foods.  Will obtain serum IgE levels to foods that have caused diarrhea  to determine if there is a food allergy vs food intolerance.    We will call with results.   Follow-up in 3-4 months or sooner if needed   I appreciate the opportunity to take part in Carlyle's care. Please do not hesitate to contact me with questions.  Sincerely,   Margo AyeShaylar Lexey Fletes, MD Allergy/Immunology [facility]

## 2018-02-27 NOTE — Patient Instructions (Signed)
Itching    - itch without rash    - continue daily moisturization 1-2 times a day    - will have you trial Ryvent 1 tab twice a day - take first tablet during day to determine if will cause sedation.  This is an antihistamine    - will obtain labs to determine if any underlying causes for itch  Allergies    - as above trial Ryvent    - for nasal drainage/post-nasal drip trial Astelin, 1-2 sprays each nostril up to twice a day    - will obtain environmental allergy panel  Adverse food reaction   - diarrhea following certain foods.  Will obtain serum IgE levels to foods that have caused diarrhea to determine if there is a food allergy vs food intolerance.    We will call with results.   Follow-up in 3-4 months or sooner if needed

## 2018-03-04 LAB — ALLERGEN, KIWI FRUIT, F84: Kiwi Fruit: <0.1 kU/L

## 2018-03-04 LAB — CBC WITH DIFFERENTIAL/PLATELET
Basophils Absolute: 0 10*3/uL (ref 0.0–0.2)
Basos: 0 %
EOS (ABSOLUTE): 0.1 10*3/uL (ref 0.0–0.4)
Eos: 2 %
Hematocrit: 45.3 % (ref 34.0–46.6)
Hemoglobin: 15.3 g/dL (ref 11.1–15.9)
Immature Grans (Abs): 0 10*3/uL (ref 0.0–0.1)
Immature Granulocytes: 0 %
Lymphocytes Absolute: 1.2 10*3/uL (ref 0.7–3.1)
Lymphs: 23 %
MCH: 29.9 pg (ref 26.6–33.0)
MCHC: 33.8 g/dL (ref 31.5–35.7)
MCV: 89 fL (ref 79–97)
Monocytes Absolute: 0.4 10*3/uL (ref 0.1–0.9)
Monocytes: 8 %
Neutrophils Absolute: 3.5 10*3/uL (ref 1.4–7.0)
Neutrophils: 67 %
Platelets: 190 10*3/uL (ref 150–450)
RBC: 5.11 x10E6/uL (ref 3.77–5.28)
RDW: 14.8 % (ref 12.3–15.4)
WBC: 5.3 10*3/uL (ref 3.4–10.8)

## 2018-03-04 LAB — COMPREHENSIVE METABOLIC PANEL
ALT: 14 IU/L (ref 0–32)
AST: 23 IU/L (ref 0–40)
Albumin/Globulin Ratio: 1.5 (ref 1.2–2.2)
Albumin: 4.1 g/dL (ref 3.2–4.6)
Alkaline Phosphatase: 80 IU/L (ref 39–117)
BUN/Creatinine Ratio: 19 (ref 12–28)
BUN: 18 mg/dL (ref 10–36)
Bilirubin Total: 0.6 mg/dL (ref 0.0–1.2)
CO2: 22 mmol/L (ref 20–29)
Calcium: 9.1 mg/dL (ref 8.7–10.3)
Chloride: 107 mmol/L — ABNORMAL HIGH (ref 96–106)
Creatinine, Ser: 0.97 mg/dL (ref 0.57–1.00)
GFR calc Af Amer: 59 mL/min/{1.73_m2} — ABNORMAL LOW (ref >59)
GFR calc non Af Amer: 51 mL/min/{1.73_m2} — ABNORMAL LOW (ref >59)
Globulin, Total: 2.8 g/dL (ref 1.5–4.5)
Glucose: 86 mg/dL (ref 65–99)
Potassium: 4.3 mmol/L (ref 3.5–5.2)
Sodium: 143 mmol/L (ref 134–144)
Total Protein: 6.9 g/dL (ref 6.0–8.5)

## 2018-03-04 LAB — ALLERGEN PANEL, FOOD-BERRY
Allergen Blueberry IgE: <0.1 kU/L
Allergen Strawberry IgE: <0.1 kU/L
F343-IgE Raspberry: <0.1 kU/L

## 2018-03-04 LAB — ALLERGENS W/TOTAL IGE AREA 2
Alternaria Alternata IgE: <0.1 kU/L
Aspergillus Fumigatus IgE: <0.1 kU/L
Bermuda Grass IgE: <0.1 kU/L
Cat Dander IgE: <0.1 kU/L
Cedar, Mountain IgE: <0.1 kU/L
Cladosporium Herbarum IgE: <0.1 kU/L
Cockroach, German IgE: <0.1 kU/L
Common Silver Birch IgE: <0.1 kU/L
Cottonwood IgE: <0.1 kU/L
D Farinae IgE: <0.1 kU/L
D Pteronyssinus IgE: <0.1 kU/L
Dog Dander IgE: <0.1 kU/L
Elm, American IgE: <0.1 kU/L
IgE (Immunoglobulin E), Serum: <2 IU/mL — ABNORMAL LOW (ref 6–495)
Johnson Grass IgE: <0.1 kU/L
Maple/Box Elder IgE: <0.1 kU/L
Mouse Urine IgE: <0.1 kU/L
Oak, White IgE: <0.1 kU/L
Pecan, Hickory IgE: <0.1 kU/L
Penicillium Chrysogen IgE: <0.1 kU/L
Pigweed, Rough IgE: <0.1 kU/L
Ragweed, Short IgE: <0.1 kU/L
Sheep Sorrel IgE Qn: <0.1 kU/L
Timothy Grass IgE: <0.1 kU/L
White Mulberry IgE: <0.1 kU/L

## 2018-03-04 LAB — ALLERGENS(7)
Brazil Nut IgE: <0.1 kU/L
F020-IgE Almond: <0.1 kU/L
F202-IgE Cashew Nut: <0.1 kU/L
Hazelnut (Filbert) IgE: <0.1 kU/L
Peanut IgE: <0.1 kU/L
Pecan Nut IgE: <0.1 kU/L
Walnut IgE: <0.1 kU/L

## 2018-03-04 LAB — T4 AND TSH
T4, Total: 7.4 ug/dL (ref 4.5–12.0)
TSH: 1.35 u[IU]/mL (ref 0.450–4.500)

## 2018-03-04 LAB — ALLERGEN, KIDNEY BEAN, RF287: Kidney Bean IgE: <0.1 kU/L

## 2018-03-04 LAB — ALLERGEN, BEAN BLACK
Black Bean*, IgE: <0.35 kU/L (ref <0.35)
Class Interpretation: 0

## 2018-03-04 LAB — F214-IGE SPINACH: F214-IgE Spinach: <0.1 kU/L

## 2018-03-04 LAB — ALLERGEN, BAKERS YEAST, F45: F045-IgE Yeast: <0.1 kU/L

## 2018-03-04 LAB — ANA W/REFLEX IF POSITIVE: Anti Nuclear Antibody(ANA): NEGATIVE

## 2018-03-04 LAB — ALLERGEN, APPLE F49: Allergen Apple, IgE: <0.1 kU/L

## 2018-03-04 LAB — ALLERGEN BANANA: Allergen Banana IgE: <0.1 kU/L

## 2018-03-04 LAB — ALLERGEN, RICE, F9: Allergen Rice IgE: <0.1 kU/L

## 2018-03-11 NOTE — Progress Notes (Signed)
Thus appears she does not have much of a response to antihistamines.   If daughter-in-law is able to pick up Eucrisa samples she can try topical application 1-2 times a day.   I have had some success with itch with Dana Morrison (however insurance is likely not to cover).   She also could try use of moisturizers containing ceramides like CeraVe (if not already using this moisturizer).

## 2018-04-15 ENCOUNTER — Ambulatory Visit (INDEPENDENT_AMBULATORY_CARE_PROVIDER_SITE_OTHER): Payer: Medicare HMO | Admitting: *Deleted

## 2018-04-15 DIAGNOSIS — I480 Paroxysmal atrial fibrillation: Secondary | ICD-10-CM

## 2018-04-15 DIAGNOSIS — I495 Sick sinus syndrome: Secondary | ICD-10-CM

## 2018-04-16 NOTE — Progress Notes (Signed)
Remote pacemaker transmission.   

## 2018-04-21 ENCOUNTER — Other Ambulatory Visit: Payer: Self-pay | Admitting: Cardiovascular Disease

## 2018-04-21 MED ORDER — BUMETANIDE 1 MG PO TABS: 1.0000 mg | ORAL_TABLET | Freq: Every day | ORAL | 2 refills | Status: DC

## 2018-04-21 NOTE — Telephone Encounter (Signed)
Eliquis 2.5mg  refill request received; pt is 84 yrs old, wt-86.7kg, Crea-0.97 on 02/27/18, last seen by Dr. Eden Emms on 12/09/17; per dosing criteria pt should be on 5mg ; per 02/04/18 staff msg with Dr. Eden Emms pt is to remain on 2.5mg  dose; will send in refill to requested pharmacy.

## 2018-04-23 ENCOUNTER — Encounter: Payer: Self-pay | Admitting: Cardiology

## 2018-06-19 ENCOUNTER — Telehealth: Payer: Self-pay | Admitting: Cardiovascular Disease

## 2018-06-19 NOTE — Telephone Encounter (Signed)
Outpatient Medication Detail    Disp Refills Start End   dronedarone (MULTAQ) 400 MG tablet 180 tablet 3 02/05/2018    Sig - Route: Take 1 tablet (400 mg total) by mouth 2 (two) times daily with a meal. - Oral   Sent to pharmacy as: dronedarone (MULTAQ) 400 MG tablet   E-Prescribing Status: Receipt confirmed by pharmacy (02/05/2018 2:49 PM EDT)   Pharmacy   [facility] [place] - [id number]

## 2018-06-19 NOTE — Telephone Encounter (Signed)
New message    *STAT* If patient is at the pharmacy, call can be transferred to refill team.   1. Which medications need to be refilled? (please list name of each medication and dose if known) dronedarone (MULTAQ) 400 MG tablet  2. Which pharmacy/location (including street and city if local pharmacy) is medication to be sent to?[facility] [place], [place] - [id number]  3. Do they need a 30 day or 90 day supply? 90 day

## 2018-06-19 NOTE — Telephone Encounter (Signed)
Spoke with patient and made her aware that she has refills remaining with [facility] as below. She will call them and ask them to send it.

## 2018-06-27 LAB — CUP PACEART REMOTE DEVICE CHECK
Battery Remaining Longevity: 128 mo
Battery Remaining Percentage: 95.5 %
Battery Voltage: 2.99 V
Brady Statistic AP VP Percent: 1 %
Brady Statistic AP VS Percent: 5.2 %
Brady Statistic AS VP Percent: 1 %
Brady Statistic AS VS Percent: 93 %
Brady Statistic RA Percent Paced: 5.1 %
Brady Statistic RV Percent Paced: 3 %
Date Time Interrogation Session: 20191007060013
Implantable Lead Implant Date: 20161230
Implantable Lead Implant Date: 20161230
Implantable Lead Location: 753859
Implantable Lead Location: 753860
Implantable Pulse Generator Implant Date: 20161230
Lead Channel Impedance Value: 460 Ohm
Lead Channel Impedance Value: 560 Ohm
Lead Channel Pacing Threshold Amplitude: 1 V
Lead Channel Pacing Threshold Amplitude: 1.25 V
Lead Channel Pacing Threshold Pulse Width: 0.4 ms
Lead Channel Pacing Threshold Pulse Width: 0.4 ms
Lead Channel Sensing Intrinsic Amplitude: 3.1 mV
Lead Channel Sensing Intrinsic Amplitude: 6 mV
Lead Channel Setting Pacing Amplitude: 2 V
Lead Channel Setting Pacing Amplitude: 2.5 V
Lead Channel Setting Pacing Pulse Width: 0.4 ms
Lead Channel Setting Sensing Sensitivity: 1 mV
Pulse Gen Model: 2240
Pulse Gen Serial Number: 7845839

## 2018-07-03 DIAGNOSIS — R0989 Other specified symptoms and signs involving the circulatory and respiratory systems: Secondary | ICD-10-CM | POA: Diagnosis not present

## 2018-07-03 DIAGNOSIS — J189 Pneumonia, unspecified organism: Secondary | ICD-10-CM | POA: Diagnosis not present

## 2018-07-03 DIAGNOSIS — J168 Pneumonia due to other specified infectious organisms: Secondary | ICD-10-CM | POA: Diagnosis not present

## 2018-07-03 DIAGNOSIS — R05 Cough: Secondary | ICD-10-CM | POA: Diagnosis not present

## 2018-07-15 ENCOUNTER — Ambulatory Visit (INDEPENDENT_AMBULATORY_CARE_PROVIDER_SITE_OTHER): Payer: Medicare HMO

## 2018-07-15 DIAGNOSIS — I480 Paroxysmal atrial fibrillation: Secondary | ICD-10-CM | POA: Diagnosis not present

## 2018-07-15 DIAGNOSIS — I495 Sick sinus syndrome: Secondary | ICD-10-CM

## 2018-07-16 DIAGNOSIS — R05 Cough: Secondary | ICD-10-CM | POA: Diagnosis not present

## 2018-07-16 DIAGNOSIS — J189 Pneumonia, unspecified organism: Secondary | ICD-10-CM | POA: Diagnosis not present

## 2018-07-16 LAB — CUP PACEART REMOTE DEVICE CHECK
Battery Remaining Longevity: 128 mo
Battery Remaining Percentage: 95.5 %
Battery Voltage: 2.99 V
Brady Statistic AP VP Percent: 1.2 %
Brady Statistic AP VS Percent: 5.6 %
Brady Statistic AS VP Percent: 1 %
Brady Statistic AS VS Percent: 91 %
Brady Statistic RA Percent Paced: 5.8 %
Brady Statistic RV Percent Paced: 3 %
Date Time Interrogation Session: 20200106070014
Implantable Lead Implant Date: 20161230
Implantable Lead Implant Date: 20161230
Implantable Lead Location: 753859
Implantable Lead Location: 753860
Implantable Pulse Generator Implant Date: 20161230
Lead Channel Impedance Value: 480 Ohm
Lead Channel Impedance Value: 550 Ohm
Lead Channel Pacing Threshold Amplitude: 1 V
Lead Channel Pacing Threshold Amplitude: 1.25 V
Lead Channel Pacing Threshold Pulse Width: 0.4 ms
Lead Channel Pacing Threshold Pulse Width: 0.4 ms
Lead Channel Sensing Intrinsic Amplitude: 4.2 mV
Lead Channel Sensing Intrinsic Amplitude: 8.4 mV
Lead Channel Setting Pacing Amplitude: 2 V
Lead Channel Setting Pacing Amplitude: 2.5 V
Lead Channel Setting Pacing Pulse Width: 0.4 ms
Lead Channel Setting Sensing Sensitivity: 1 mV
Pulse Gen Model: 2240
Pulse Gen Serial Number: 7845839

## 2018-07-17 NOTE — Progress Notes (Signed)
Remote pacemaker transmission.   

## 2018-10-14 ENCOUNTER — Telehealth: Payer: Self-pay

## 2018-10-14 ENCOUNTER — Other Ambulatory Visit: Payer: Self-pay

## 2018-10-14 ENCOUNTER — Ambulatory Visit (INDEPENDENT_AMBULATORY_CARE_PROVIDER_SITE_OTHER): Payer: Medicare HMO | Admitting: *Deleted

## 2018-10-14 DIAGNOSIS — I495 Sick sinus syndrome: Secondary | ICD-10-CM | POA: Diagnosis not present

## 2018-10-14 NOTE — Telephone Encounter (Signed)
Unable to speak  with patient to remind of missed remote transmission 

## 2018-10-15 LAB — CUP PACEART REMOTE DEVICE CHECK
Battery Remaining Longevity: 127 mo
Battery Remaining Percentage: 95.5 %
Battery Voltage: 2.99 V
Brady Statistic AP VP Percent: 1 %
Brady Statistic AP VS Percent: 3.2 %
Brady Statistic AS VP Percent: 1 %
Brady Statistic AS VS Percent: 95 %
Brady Statistic RA Percent Paced: 2.8 %
Brady Statistic RV Percent Paced: 1.2 %
Date Time Interrogation Session: 20200408050657
Implantable Lead Implant Date: 20161230
Implantable Lead Implant Date: 20161230
Implantable Lead Location: 753859
Implantable Lead Location: 753860
Implantable Pulse Generator Implant Date: 20161230
Lead Channel Impedance Value: 440 Ohm
Lead Channel Impedance Value: 510 Ohm
Lead Channel Pacing Threshold Amplitude: 1 V
Lead Channel Pacing Threshold Amplitude: 1.25 V
Lead Channel Pacing Threshold Pulse Width: 0.4 ms
Lead Channel Pacing Threshold Pulse Width: 0.4 ms
Lead Channel Sensing Intrinsic Amplitude: 3.2 mV
Lead Channel Sensing Intrinsic Amplitude: 6.1 mV
Lead Channel Setting Pacing Amplitude: 2 V
Lead Channel Setting Pacing Amplitude: 2.5 V
Lead Channel Setting Pacing Pulse Width: 0.4 ms
Lead Channel Setting Sensing Sensitivity: 1 mV
Pulse Gen Model: 2240
Pulse Gen Serial Number: 7845839

## 2018-10-22 ENCOUNTER — Encounter: Payer: Self-pay | Admitting: Cardiology

## 2018-10-22 NOTE — Progress Notes (Signed)
Remote pacemaker transmission.   

## 2018-12-10 ENCOUNTER — Other Ambulatory Visit: Payer: Self-pay | Admitting: Cardiovascular Disease

## 2018-12-17 ENCOUNTER — Other Ambulatory Visit: Payer: Self-pay | Admitting: Cardiovascular Disease

## 2018-12-17 DIAGNOSIS — H5210 Myopia, unspecified eye: Secondary | ICD-10-CM | POA: Diagnosis not present

## 2018-12-22 ENCOUNTER — Telehealth: Payer: Self-pay | Admitting: Nurse Practitioner

## 2018-12-22 NOTE — Telephone Encounter (Signed)
Merlin alert for ongoing AF episode. Previously was maintaining SR. On Eliquis and Multaq. Will ask AF to see virtually this week.     Chanetta Marshall, NP 12/22/2018 11:13 AM

## 2018-12-22 NOTE — Telephone Encounter (Signed)
Lft vcml for pt to [facility] to sched.

## 2018-12-25 ENCOUNTER — Encounter (HOSPITAL_COMMUNITY): Payer: Self-pay | Admitting: Nurse Practitioner

## 2018-12-25 ENCOUNTER — Other Ambulatory Visit: Payer: Self-pay

## 2018-12-25 ENCOUNTER — Ambulatory Visit (HOSPITAL_COMMUNITY)
Admission: RE | Admit: 2018-12-25 | Discharge: 2018-12-25 | Disposition: A | Discharge status: Home / Self Care | Payer: Medicare HMO | Source: Ambulatory Visit | Attending: Nurse Practitioner | Admitting: Nurse Practitioner

## 2018-12-25 DIAGNOSIS — I480 Paroxysmal atrial fibrillation: Secondary | ICD-10-CM

## 2018-12-25 NOTE — Progress Notes (Signed)
Electrophysiology TeleHealth Note   Due to national recommendations of social distancing due to COVID 19, Audio/video telehealth visit is felt to be most appropriate for this patient at this time.  See MyChart message/consent below  from today for patient consent regarding telehealth for the [facility].    Date:  12/25/2018   ID:  Dana Morrison, DOB 11/21/1923, MRN [id number]  Location: home  Provider location: [address] [place], [place] [address] Evaluation Performed: Follow up device clinc  PCP:  Soundra PilonBrake, Andrew R, FNP  Primary Cardiologist:  Dr. Eden EmmsNishan Primary Electrophysiologist: Dr. Ladona Ridgelaylor   CC: persistent  afib by device report   History of Present Illness: Dana Morrison is a 85 y.o. female who presents via audio/video conferencing for a telehealth visit today.   The patient is referred for new consultation regarding  afib by Dr Donald Sivaaylor/devce clinic.  I spoke to pt re device clinic seeing that she had had persistent afib for a few days.with a prior afib burden of 2.4%. She is on multaq/BB/ eliquis. She is unaware. She states with covid, her housekeeper has not been able to come in and she has been doing more of the mopping/vaccummng/ linen changes [place] Wednesday.No more shortness of breath than her usual and no LLE.   Today, she denies symptoms of palpitations, chest pain, shortness of breath, orthopnea, PND, lower extremity edema, claudication, dizziness, presyncope, syncope, bleeding, or neurologic sequela. The patient is tolerating medications without difficulties and is otherwise without complaint today.   she denies symptoms of cough, fevers, chills, or new SOB worrisome for COVID 19.     she has a BMI of There is no height or weight on file to calculate BMI.. There were no vitals filed for this visit.  Past Medical History:  Diagnosis Date  . Atrial fibrillation (HCC)   . Chronic diastolic heart  failure (HCC) 1/9/14783/01/2018  . Congestive heart failure (CHF) (HCC)    estimated ejection fraction is 54%; diastolic  . Coronary artery disease   . Decreased vision 09/12/2017  . Hx of myocardial infarction 2010  . Hypertension   . Osteoarthritis of hip 09/12/2017  . Osteoarthritis of knee 09/12/2017  . Paroxysmal atrial fibrillation (HCC) 09/12/2017  . Scalp psoriasis 09/12/2017  . Seasonal allergic rhinitis due to pollen 09/12/2017  . Urticaria 09/12/2017  . Venous insufficiency 09/12/2017   Past Surgical History:  Procedure Laterality Date  . APPENDECTOMY    . ARM FRACTURE Left   . FRACTURE SURGERY Bilateral    WRISTS  . LAPAROSCOPIC LYSIS OF ADHESIONS    . PACEMAKER INSERTION    . PLACEMENT OF STENTS     X5  . REPLACEMENT TOTAL KNEE    . REVISION TOTAL HIP ARTHROPLASTY Left      Current Outpatient Medications  Medication Sig Dispense Refill  . aspirin 81 MG chewable tablet Chew 1 tablet by mouth daily.    Marland Kitchen. azelastine (ASTELIN) 0.1 % nasal spray Place 2 sprays into both nostrils 2 (two) times daily. 30 mL 5  . bumetanide (BUMEX) 1 MG tablet Take 1 tablet (1 mg total) by mouth daily. 90 tablet 2  . Carbinoxamine Maleate (RYVENT) 6 MG TABS Take 6 mg by mouth 2 (two) times daily. 120 tablet 1  . clopidogrel (PLAVIX) 75 MG tablet Take 1 tablet (75 mg total) by mouth daily. 90 tablet 3  . dronedarone (MULTAQ) 400 MG tablet Take 1 tablet (  400 mg total) by mouth 2 (two) times daily with a meal. 180 tablet 3  . ELIQUIS 2.5 MG TABS tablet TAKE 1 TABLET (2.5 MG TOTAL) BY MOUTH 2 (TWO) TIMES DAILY. 180 tablet 2  . isosorbide mononitrate (IMDUR) 30 MG 24 hr tablet Take 1 tablet (30 mg total) by mouth daily. 90 tablet 3  . metoprolol succinate (TOPROL XL) 25 MG 24 hr tablet Take 1 tablet (25 mg total) by mouth daily. 90 tablet 3  . montelukast (SINGULAIR) 10 MG tablet Take 10 mg by mouth daily.  0  . Multiple Vitamin (MULTIVITAMIN) capsule Take 1 capsule by mouth daily.    . nitroGLYCERIN (NITROSTAT)  0.4 MG SL tablet Place 0.4 mg under the tongue every 5 (five) minutes as needed for chest pain.    . potassium chloride SA (K-DUR,KLOR-CON) 20 MEQ tablet Take 1 tablet (20 mEq total) by mouth 2 (two) times daily. 180 tablet 3  . pravastatin (PRAVACHOL) 20 MG tablet TAKE 1 TABLET EVERY DAY 90 tablet 0   No current facility-administered medications for this encounter.     Allergies:   Psyllium, Gluten meal, and Other   Social History:  The patient  reports that she has quit smoking. She has never used smokeless tobacco. She reports current alcohol use. She reports that she does not use drugs.   Family History:  The patient's  family history includes Healthy in her daughter, daughter, daughter, and son; Heart attack in her father and mother; Lung cancer in her brother; Microcephaly in her mother; Other in her brother.    ROS:  Please see the history of present illness.   All other systems are personally reviewed and negative.   Exam: NA phone call  Recent Labs: 02/27/2018: ALT 14; BUN 18; Creatinine, Ser 0.97; Hemoglobin 15.3; Platelets 190; Potassium 4.3; Sodium 143; TSH 1.350  personally reviewed    Other studies personally reviewed: Epic records reviewed    ASSESSMENT AND PLAN:  1.   persistent  atrial fibrillation Pt unaware/asymptomatc She   wlll continue to take her usual multaq/BB without change We will get device clnic to walk her thru sending another report next Thursday pm as she has a MD appointment that am to see if afib persists. She will continue eliquis 2.5 mg bid. This patients CHA2DS2-VASc Score and unadjusted Ischemic Stroke Rate (% per year) is equal to 7.2 % stroke rate/year from a score of 5  Above score calculated as 1 point each if present [CHF, HTN, DM, Vascular=MI/PAD/Aortic Plaque, Age if 65-74, or Female] Above score calculated as 2 points each if present [Age > 75, or Stroke/TIA/TE]    COVID screen The patient does not have any symptoms that suggest  any further testing/ screening at this time.  Social distancing reinforced today.    Follow-up:  Next week after device report is reviewed  Current medicines are reviewed at length with the patient today.   The patient does not have concerns regarding her medicines.  The following changes were made today:  none  Labs/ tests ordered today include: none No orders of the defined types were placed in this encounter.   Patient Risk:  after full review of this patients clinical status, I feel that they are at  hgh risk at this time.   Today, I have spent 15 minutes with the patient with telehealth technology discussing afib  Signed, Roderic Palau NP  12/25/2018 10:18 AM  [facility] [address]  [place], [place] [address] [phone]   I hereby voluntarily request, consent and authorize the [facility] and its employed or contracted physicians, physician assistants, nurse practitioners or other licensed health care professionals (the Practitioner), to provide me with telemedicine health care services (the "Services") as deemed necessary by the treating Practitioner. I acknowledge and consent to receive the Services by the Practitioner via telemedicine. I understand that the telemedicine visit will involve communicating with the Practitioner through live audiovisual communication technology and the disclosure of certain medical information by electronic transmission. I acknowledge that I have been given the opportunity to request an in-person assessment or other available alternative prior to the telemedicine visit and am voluntarily participating in the telemedicine visit.   I understand that I have the right to withhold or withdraw my consent to the use of telemedicine in the course of my care at any time, without affecting my right to future care or treatment, and that the Practitioner or I may terminate the telemedicine visit at any time. I understand  that I have the right to inspect all information obtained and/or recorded in the course of the telemedicine visit and may receive copies of available information for a reasonable fee.  I understand that some of the potential risks of receiving the Services via telemedicine include:   Delay or interruption in medical evaluation due to technological equipment failure or disruption;  Information transmitted may not be sufficient (e.g. poor resolution of images) to allow for appropriate medical decision making by the Practitioner; and/or  In rare instances, security protocols could fail, causing a breach of personal health information.   Furthermore, I acknowledge that it is my responsibility to provide information about my medical history, conditions and care that is complete and accurate to the best of my ability. I acknowledge that Practitioner's advice, recommendations, and/or decision may be based on factors not within their control, such as incomplete or inaccurate data provided by me or distortions of diagnostic images or specimens that may result from electronic transmissions. I understand that the practice of medicine is not an exact science and that Practitioner makes no warranties or guarantees regarding treatment outcomes. I acknowledge that I will receive a copy of this consent concurrently upon execution via email to the email address I last provided but may also request a printed copy by calling the office of the [facility].  I understand that my insurance will be billed for this visit.   I have read or had this consent read to me.  I understand the contents of this consent, which adequately explains the benefits and risks of the Services being provided via telemedicine.  I have been provided ample opportunity to ask questions regarding this consent and the Services and have had my questions answered to my satisfaction.  I give my informed consent for the services to be  provided through the use of telemedicine in my medical care  By participating in this telemedicine visit I agree to the above.

## 2018-12-28 DIAGNOSIS — Z0100 Encounter for examination of eyes and vision without abnormal findings: Secondary | ICD-10-CM | POA: Diagnosis not present

## 2019-01-08 ENCOUNTER — Telehealth: Payer: Self-pay

## 2019-01-08 ENCOUNTER — Ambulatory Visit (INDEPENDENT_AMBULATORY_CARE_PROVIDER_SITE_OTHER): Payer: Medicare HMO | Admitting: *Deleted

## 2019-01-08 DIAGNOSIS — I495 Sick sinus syndrome: Secondary | ICD-10-CM

## 2019-01-08 NOTE — Telephone Encounter (Signed)
Left message for patient to remind of missed remote transmission.  

## 2019-01-11 LAB — CUP PACEART REMOTE DEVICE CHECK
Battery Remaining Longevity: 129 mo
Battery Remaining Percentage: 95.5 %
Battery Voltage: 2.99 V
Brady Statistic AP VP Percent: 3.1 %
Brady Statistic AP VS Percent: 6.6 %
Brady Statistic AS VP Percent: 1.5 %
Brady Statistic AS VS Percent: 88 %
Brady Statistic RA Percent Paced: 8.6 %
Brady Statistic RV Percent Paced: 6.5 %
Date Time Interrogation Session: 20200703074508
Implantable Lead Implant Date: 20161230
Implantable Lead Implant Date: 20161230
Implantable Lead Location: 753859
Implantable Lead Location: 753860
Implantable Pulse Generator Implant Date: 20161230
Lead Channel Impedance Value: 480 Ohm
Lead Channel Impedance Value: 600 Ohm
Lead Channel Pacing Threshold Amplitude: 1 V
Lead Channel Pacing Threshold Amplitude: 1.25 V
Lead Channel Pacing Threshold Pulse Width: 0.4 ms
Lead Channel Pacing Threshold Pulse Width: 0.4 ms
Lead Channel Sensing Intrinsic Amplitude: 2.5 mV
Lead Channel Sensing Intrinsic Amplitude: 3.1 mV
Lead Channel Setting Pacing Amplitude: 2 V
Lead Channel Setting Pacing Amplitude: 2.5 V
Lead Channel Setting Pacing Pulse Width: 0.4 ms
Lead Channel Setting Sensing Sensitivity: 1 mV
Pulse Gen Model: 2240
Pulse Gen Serial Number: 7845839

## 2019-01-13 ENCOUNTER — Ambulatory Visit (INDEPENDENT_AMBULATORY_CARE_PROVIDER_SITE_OTHER): Payer: Medicare HMO | Admitting: *Deleted

## 2019-01-13 DIAGNOSIS — I495 Sick sinus syndrome: Secondary | ICD-10-CM

## 2019-01-14 LAB — CUP PACEART REMOTE DEVICE CHECK
Battery Remaining Longevity: 129 mo
Battery Remaining Percentage: 95.5 %
Battery Voltage: 2.99 V
Brady Statistic AP VP Percent: 3.2 %
Brady Statistic AP VS Percent: 6.7 %
Brady Statistic AS VP Percent: 1.5 %
Brady Statistic AS VS Percent: 87 %
Brady Statistic RA Percent Paced: 8.8 %
Brady Statistic RV Percent Paced: 6.5 %
Date Time Interrogation Session: 20200708014720
Implantable Lead Implant Date: 20161230
Implantable Lead Implant Date: 20161230
Implantable Lead Location: 753859
Implantable Lead Location: 753860
Implantable Pulse Generator Implant Date: 20161230
Lead Channel Impedance Value: 480 Ohm
Lead Channel Impedance Value: 600 Ohm
Lead Channel Pacing Threshold Amplitude: 1 V
Lead Channel Pacing Threshold Amplitude: 1.25 V
Lead Channel Pacing Threshold Pulse Width: 0.4 ms
Lead Channel Pacing Threshold Pulse Width: 0.4 ms
Lead Channel Sensing Intrinsic Amplitude: 2.3 mV
Lead Channel Sensing Intrinsic Amplitude: 2.7 mV
Lead Channel Setting Pacing Amplitude: 2 V
Lead Channel Setting Pacing Amplitude: 2.5 V
Lead Channel Setting Pacing Pulse Width: 0.4 ms
Lead Channel Setting Sensing Sensitivity: 1 mV
Pulse Gen Model: 2240
Pulse Gen Serial Number: 7845839

## 2019-01-15 ENCOUNTER — Encounter: Payer: Self-pay | Admitting: Cardiology

## 2019-01-15 NOTE — Addendum Note (Signed)
Addended by: Tiajuana Amass on: 01/15/2019 10:46 AM   Modules accepted: Level of Service

## 2019-01-15 NOTE — Progress Notes (Signed)
Remote pacemaker transmission.   

## 2019-01-23 ENCOUNTER — Encounter: Payer: Self-pay | Admitting: Cardiology

## 2019-01-23 NOTE — Progress Notes (Signed)
Remote pacemaker transmission.   

## 2019-02-27 ENCOUNTER — Other Ambulatory Visit: Payer: Self-pay | Admitting: Cardiovascular Disease

## 2019-02-27 DIAGNOSIS — I480 Paroxysmal atrial fibrillation: Secondary | ICD-10-CM

## 2019-03-02 NOTE — Telephone Encounter (Addendum)
Prescription refill request for Eliquis received.  Last office visit: Roderic Palau ( 12/25/2018) Scr: 0.97 (02/27/2018) Age: 85 y.o. Weight: 86.7 kg  Pt is overdue for labs and needs to schedule an appointment to get labs. Called pt and made her aware that to keep getting refills pt will need to come in for labs. Pt stated she does not feel comfortable coming in for labs due to Covid. Spoke with [facility] to refill prescription but then pt will need to come in for labs.    Per dosing criteria pt should be on 5mg  of Eliquis; per 02/04/2018 staff message with Dr. Johnsie Cancel pt is to remain on 2.5mg  dose. Will refill medication.

## 2019-03-05 ENCOUNTER — Other Ambulatory Visit: Payer: Self-pay | Admitting: Cardiovascular Disease

## 2019-03-05 NOTE — Telephone Encounter (Signed)
°*  STAT* If patient is at the pharmacy, call can be transferred to refill team.   1. Which medications need to be refilled? (please list name of each medication and dose if known) ELIQUIS 2.5 MG TABS tablet  2. Which pharmacy/location (including street and city if local pharmacy) is medication to be sent to? [place], [place]  3. Do they need a 30 day or 90 day supply? 90 day

## 2019-03-05 NOTE — Telephone Encounter (Signed)
We sent in refill of Eliquis 03/04/19 #180 tabs with no refills secondary to pt needing labwork to evaluate to make sure dosage is appropriate.  Receipt received from [facility] mail order they received.  Will need labs prior to further refill.  Pt is aware.

## 2019-03-06 ENCOUNTER — Telehealth: Payer: Self-pay | Admitting: Cardiovascular Disease

## 2019-03-06 ENCOUNTER — Other Ambulatory Visit: Payer: Self-pay | Admitting: Cardiovascular Disease

## 2019-03-06 MED ORDER — METOPROLOL SUCCINATE ER 25 MG PO TB24: 25.0000 mg | ORAL_TABLET | Freq: Every day | ORAL | 0 refills | Status: DC

## 2019-03-06 NOTE — Telephone Encounter (Signed)
° ° ° °*  STAT* If patient is at the pharmacy, call can be transferred to refill team.   1. Which medications need to be refilled? (please list name of each medication and dose if known) metoprolol succinate (TOPROL XL) 25 MG 24 hr tablet  2. Which pharmacy/location (including street and city if local pharmacy) is medication to be sent to? [facility]  3. Do they need a 30 day or 90 day supply? [place]

## 2019-03-06 NOTE — Telephone Encounter (Signed)
New Message    *STAT* If patient is at the pharmacy, call can be transferred to refill team.   1. Which medications need to be refilled? (please list name of each medication and dose if known) metoprolol succinate (TOPROL XL) 25 MG 24 hr tablet  2. Which pharmacy/location (including street and city if local pharmacy) is medication to be sent to? [place], [place]  3. Do they need a 30 day or 90 day supply? 90 day

## 2019-03-06 NOTE — Telephone Encounter (Signed)
Pt's medication was sent to pt's pharmacy as requested. Confirmation received.  °

## 2019-03-11 ENCOUNTER — Ambulatory Visit: Payer: Medicare HMO | Admitting: Cardiovascular Disease

## 2019-03-26 ENCOUNTER — Other Ambulatory Visit: Payer: Self-pay | Admitting: Cardiovascular Disease

## 2019-04-09 DIAGNOSIS — H16143 Punctate keratitis, bilateral: Secondary | ICD-10-CM | POA: Diagnosis not present

## 2019-04-09 NOTE — Progress Notes (Signed)
Cardiology Office Note   Date:  04/10/2019   ID:  Saraiah Bhat, DOB [birth year], MRN [id number]  PCP:  Kristen Loader, FNP  Cardiologist:   Jenkins Rouge, MD   No chief complaint on file.     History of Present Illness:  85 y.o. from [place]. Stent January 2016, PAF , PPM and HLD  Her son is an Network engineer at [employer]. Two grand daughters here. She has no chest pain indicates MI in 2010 with stents and repeat in 2016 Chronic dyspnea. Pacer placed 2015 has seen dr Lovena Le for pacer check  She still drives and lives independently  Compliant with meds   TTE 09/20/17  EF 55-60%  Estimated PA 35 mmHg no significant valve disease   Some LE edema dependant worse on RLE 11/15/17 arterial doppler normal   Has had PAF Rx by Dr Lovena Le. On low dose eliquis, lopressor and multaq. Not clear why still on ASA and plavix for her CAD especially since she in on anticoagulation [facility] afib clinic June with more PAF. She tends to be unaware of it PPM merlin alert indicated 2.4 % PAF burden since April Pacer placed 2016 for chronotropic incompetence    Discussed stopping plavix to avoid triple blood thinners   Past Medical History:  Diagnosis Date  . Atrial fibrillation ([place])   . Chronic diastolic heart failure ([place]) 09/12/2017  . Congestive heart failure (CHF) ([place])    estimated ejection fraction is 33%; diastolic  . Coronary artery disease   . Decreased vision 09/12/2017  . Hx of myocardial infarction 2010  . Hypertension   . Osteoarthritis of hip 09/12/2017  . Osteoarthritis of knee 09/12/2017  . Paroxysmal atrial fibrillation ([place]) 09/12/2017  . Scalp psoriasis 09/12/2017  . Seasonal allergic rhinitis due to pollen 09/12/2017  . Urticaria 09/12/2017  . Venous insufficiency 09/12/2017    Past Surgical History:  Procedure Laterality Date  . APPENDECTOMY    . ARM FRACTURE Left   . FRACTURE SURGERY Bilateral    WRISTS  . LAPAROSCOPIC LYSIS OF ADHESIONS    . PACEMAKER INSERTION    . PLACEMENT OF STENTS      X5  . REPLACEMENT TOTAL KNEE    . REVISION TOTAL HIP ARTHROPLASTY Left      Current Outpatient Medications  Medication Sig Dispense Refill  . aspirin 81 MG chewable tablet Chew 1 tablet by mouth daily.    Marland Kitchen azelastine (ASTELIN) 0.1 % nasal spray Place 2 sprays into both nostrils 2 (two) times daily. 30 mL 5  . bumetanide (BUMEX) 1 MG tablet Take 1 tablet (1 mg total) by mouth daily. 90 tablet 2  . Carbinoxamine Maleate (RYVENT) 6 MG TABS Take 6 mg by mouth 2 (two) times daily. 120 tablet 1  . clopidogrel (PLAVIX) 75 MG tablet Take 1 tablet (75 mg total) by mouth daily. 90 tablet 3  . dronedarone (MULTAQ) 400 MG tablet Take 1 tablet (400 mg total) by mouth 2 (two) times daily with a meal. Please keep upcoming appt in September with Dr. Johnsie Cancel for future refills. Thank you 180 tablet 0  . ELIQUIS 2.5 MG TABS tablet TAKE 1 TABLET TWICE DAILY 180 tablet 0  . isosorbide mononitrate (IMDUR) 30 MG 24 hr tablet Take 1 tablet (30 mg total) by mouth daily. Please make annual appt with Dr. Johnsie Cancel for future refills. [phone]. 1st attempt. 30 tablet 0  . metoprolol succinate (TOPROL XL) 25 MG 24 hr tablet Take 1 tablet (25 mg total)  by mouth daily. Please keep upcoming appt with Dr. Eden EmmsNishan in September for future refills. Thank you 90 tablet 0  . montelukast (SINGULAIR) 10 MG tablet Take 10 mg by mouth daily.  0  . Multiple Vitamin (MULTIVITAMIN) capsule Take 1 capsule by mouth daily.    . nitroGLYCERIN (NITROSTAT) 0.4 MG SL tablet Place 0.4 mg under the tongue every 5 (five) minutes as needed for chest pain.    . potassium chloride SA (K-DUR,KLOR-CON) 20 MEQ tablet Take 1 tablet (20 mEq total) by mouth 2 (two) times daily. 180 tablet 3  . pravastatin (PRAVACHOL) 20 MG tablet Take 1 tablet (20 mg total) by mouth daily. Please keep upcoming appt with Dr. Eden EmmsNishan in October before anymore refills. Thank you 90 tablet 0   No current facility-administered medications for this visit.     Allergies:    Psyllium, Gluten meal, and Other    Social History:  The patient  reports that she has quit smoking. She has never used smokeless tobacco. She reports current alcohol use. She reports that she does not use drugs.   Family History:  The patient's family history includes Healthy in her daughter, daughter, daughter, and son; Heart attack in her father and mother; Lung cancer in her brother; Microcephaly in her mother; Other in her brother.    ROS:  Please see the history of present illness.   Otherwise, review of systems are positive for none.   All other systems are reviewed and negative.    PHYSICAL EXAM: VS:  Ht 5\' 7"  (1.702 m)   BMI 29.95 kg/m  , BMI Body mass index is 29.95 kg/m. Affect appropriate Elderly female  HEENT: normal Neck supple with no adenopathy JVP normal no bruits no thyromegaly Lungs clear with no wheezing and good diaphragmatic motion Heart:  S1/S2 no murmur, no rub, gallop or click PMI normal pacer under left clavicle  Abdomen: benighn, BS positve, no tenderness, no AAA no bruit.  No HSM or HJR Distal pulses intact with no bruits Plus 2 on right edema plus one on left  Neuro non-focal Skin warm and dry Post left hip and knee replacement     EKG:  SR rate 76 PR 238 RBBB LAD PVC;s 04/10/19 SR rate 72 LAD RBBB   Recent Labs: No results found for requested labs within last 8760 hours.    Lipid Panel No results found for: CHOL, TRIG, HDL, CHOLHDL, VLDL, LDLCALC, LDLDIRECT    Wt Readings from Last 3 Encounters:  02/27/18 191 lb 3.2 oz (86.7 kg)  12/09/17 190 lb 8 oz (86.4 kg)  11/15/17 189 lb (85.7 kg)      Other studies Reviewed: Additional studies/ records that were reviewed today include: Notes from primary labs and will review notes from [place] .    ASSESSMENT AND PLAN:  1.  CAD no angina continue ASA and lopressor  Stop plavix  2. PAF more afib in June seen on televisit by Sebastian Acheonna Carrol On low dose eliquis lopressor and multaq continue  3.  HLD on statin labs with primary  4. PPM:  Normal pacer function f/u Dr Ginette Pitmanaylor [employer] device  5. Dyspnea:  No cardiac EF 55-60% TTE 09/20/17  6. Edema:  Right worse than left Bumex daily with K  Current medicines are reviewed at length with the patient today.  The patient does not have concerns regarding medicines.  The following changes have been made:  no change   No orders of the defined types were  placed in this encounter.    Disposition:   FU with EP next available and me in a year     Signed, Charlton Haws, MD  04/10/2019 9:59 AM    [facility] [address] [place], [place], [place]  [address] Phone: [phone]; Fax: [phone]

## 2019-04-10 ENCOUNTER — Encounter: Payer: Self-pay | Admitting: Cardiovascular Disease

## 2019-04-10 ENCOUNTER — Ambulatory Visit: Payer: Medicare HMO | Admitting: Cardiovascular Disease

## 2019-04-10 ENCOUNTER — Other Ambulatory Visit: Payer: Self-pay

## 2019-04-10 VITALS — BP 110/52 | HR 72 | Ht 67.0 in | Wt 192.0 lb

## 2019-04-10 DIAGNOSIS — I480 Paroxysmal atrial fibrillation: Secondary | ICD-10-CM | POA: Diagnosis not present

## 2019-04-10 DIAGNOSIS — I509 Heart failure, unspecified: Secondary | ICD-10-CM | POA: Diagnosis not present

## 2019-04-10 DIAGNOSIS — I2510 Atherosclerotic heart disease of native coronary artery without angina pectoris: Secondary | ICD-10-CM

## 2019-04-10 NOTE — Patient Instructions (Addendum)

## 2019-04-14 ENCOUNTER — Ambulatory Visit (INDEPENDENT_AMBULATORY_CARE_PROVIDER_SITE_OTHER): Payer: Medicare HMO | Admitting: *Deleted

## 2019-04-14 DIAGNOSIS — I480 Paroxysmal atrial fibrillation: Secondary | ICD-10-CM

## 2019-04-14 DIAGNOSIS — I5032 Chronic diastolic (congestive) heart failure: Secondary | ICD-10-CM

## 2019-04-14 LAB — CUP PACEART REMOTE DEVICE CHECK
Battery Remaining Longevity: 128 mo
Battery Remaining Percentage: 95.5 %
Battery Voltage: 2.99 V
Brady Statistic AP VP Percent: 3.7 %
Brady Statistic AP VS Percent: 3.8 %
Brady Statistic AS VP Percent: 2.6 %
Brady Statistic AS VS Percent: 87 %
Brady Statistic RA Percent Paced: 6.3 %
Brady Statistic RV Percent Paced: 7.1 %
Date Time Interrogation Session: 20201006082907
Implantable Lead Implant Date: 20161230
Implantable Lead Implant Date: 20161230
Implantable Lead Location: 753859
Implantable Lead Location: 753860
Implantable Pulse Generator Implant Date: 20161230
Lead Channel Impedance Value: 460 Ohm
Lead Channel Impedance Value: 600 Ohm
Lead Channel Pacing Threshold Amplitude: 1 V
Lead Channel Pacing Threshold Amplitude: 1.25 V
Lead Channel Pacing Threshold Pulse Width: 0.4 ms
Lead Channel Pacing Threshold Pulse Width: 0.4 ms
Lead Channel Sensing Intrinsic Amplitude: 1.5 mV
Lead Channel Sensing Intrinsic Amplitude: 2.4 mV
Lead Channel Setting Pacing Amplitude: 2 V
Lead Channel Setting Pacing Amplitude: 2.5 V
Lead Channel Setting Pacing Pulse Width: 0.4 ms
Lead Channel Setting Sensing Sensitivity: 1 mV
Pulse Gen Model: 2240
Pulse Gen Serial Number: 7845839

## 2019-04-22 NOTE — Progress Notes (Signed)
Remote pacemaker transmission.   

## 2019-06-02 ENCOUNTER — Other Ambulatory Visit: Payer: Self-pay | Admitting: Cardiovascular Disease

## 2019-06-25 ENCOUNTER — Other Ambulatory Visit: Payer: Self-pay | Admitting: Cardiovascular Disease

## 2019-07-14 ENCOUNTER — Ambulatory Visit (INDEPENDENT_AMBULATORY_CARE_PROVIDER_SITE_OTHER): Payer: Medicare HMO | Admitting: *Deleted

## 2019-07-14 DIAGNOSIS — I480 Paroxysmal atrial fibrillation: Secondary | ICD-10-CM

## 2019-07-15 LAB — CUP PACEART REMOTE DEVICE CHECK
Battery Remaining Longevity: 126 mo
Battery Remaining Percentage: 95.5 %
Battery Voltage: 2.98 V
Brady Statistic AP VP Percent: 8.2 %
Brady Statistic AP VS Percent: 6.6 %
Brady Statistic AS VP Percent: 8.5 %
Brady Statistic AS VS Percent: 74 %
Brady Statistic RA Percent Paced: 13 %
Brady Statistic RV Percent Paced: 17 %
Date Time Interrogation Session: 20210105221108
Implantable Lead Implant Date: 20161230
Implantable Lead Implant Date: 20161230
Implantable Lead Location: 753859
Implantable Lead Location: 753860
Implantable Pulse Generator Implant Date: 20161230
Lead Channel Impedance Value: 430 Ohm
Lead Channel Impedance Value: 600 Ohm
Lead Channel Pacing Threshold Amplitude: 1 V
Lead Channel Pacing Threshold Amplitude: 1.25 V
Lead Channel Pacing Threshold Pulse Width: 0.4 ms
Lead Channel Pacing Threshold Pulse Width: 0.4 ms
Lead Channel Sensing Intrinsic Amplitude: 1.8 mV
Lead Channel Sensing Intrinsic Amplitude: 2.1 mV
Lead Channel Setting Pacing Amplitude: 2 V
Lead Channel Setting Pacing Amplitude: 2.5 V
Lead Channel Setting Pacing Pulse Width: 0.4 ms
Lead Channel Setting Sensing Sensitivity: 1 mV
Pulse Gen Model: 2240
Pulse Gen Serial Number: 7845839

## 2019-07-30 ENCOUNTER — Other Ambulatory Visit: Payer: Self-pay | Admitting: Cardiovascular Disease

## 2019-07-30 NOTE — Telephone Encounter (Addendum)
Prescription refill request for Eliquis received.  Last office visit: 6/18/2020[name] Scr: 02/27/2018, 0.97 Age: 86 y.o. Weight: 87.1 kg   Pt overdue for blood work.Called and spoke to pt and made her aware that to make sure she is on the correct dose of Eliquis then we need to get updated labs. Scheduled her a lab appointment for 08/05/2018. Informed her that we could refill and give her a 1 month supply. [facility] so pt can Morrison up as soon as possible because she stated she was out.   Per phone note on 02/04/2018, pt is to continue on Eliquis 2.5mg  BID even though she qualifies for Eliquis 5mg  BID.

## 2019-07-31 ENCOUNTER — Other Ambulatory Visit: Payer: Self-pay | Admitting: *Deleted

## 2019-07-31 MED ORDER — APIXABAN 2.5 MG PO TABS: 2.5000 mg | ORAL_TABLET | Freq: Two times a day (BID) | ORAL | 0 refills | Status: DC

## 2019-08-06 ENCOUNTER — Other Ambulatory Visit: Payer: Self-pay

## 2019-08-06 ENCOUNTER — Other Ambulatory Visit: Payer: Medicare HMO | Admitting: *Deleted

## 2019-08-06 DIAGNOSIS — I480 Paroxysmal atrial fibrillation: Secondary | ICD-10-CM

## 2019-08-06 LAB — BASIC METABOLIC PANEL
BUN/Creatinine Ratio: 22 (ref 12–28)
BUN: 24 mg/dL (ref 10–36)
CO2: 23 mmol/L (ref 20–29)
Calcium: 9 mg/dL (ref 8.7–10.3)
Chloride: 105 mmol/L (ref 96–106)
Creatinine, Ser: 1.07 mg/dL — ABNORMAL HIGH (ref 0.57–1.00)
GFR calc Af Amer: 52 mL/min/{1.73_m2} — ABNORMAL LOW (ref >59)
GFR calc non Af Amer: 45 mL/min/{1.73_m2} — ABNORMAL LOW (ref >59)
Glucose: 91 mg/dL (ref 65–99)
Potassium: 4.2 mmol/L (ref 3.5–5.2)
Sodium: 142 mmol/L (ref 134–144)

## 2019-08-06 LAB — CBC
Hematocrit: 40.9 % (ref 34.0–46.6)
Hemoglobin: 13.6 g/dL (ref 11.1–15.9)
MCH: 27.8 pg (ref 26.6–33.0)
MCHC: 33.3 g/dL (ref 31.5–35.7)
MCV: 84 fL (ref 79–97)
Platelets: 189 10*3/uL (ref 150–450)
RBC: 4.89 x10E6/uL (ref 3.77–5.28)
RDW: 14.4 % (ref 11.7–15.4)
WBC: 7.2 10*3/uL (ref 3.4–10.8)

## 2019-08-06 NOTE — Telephone Encounter (Addendum)
Prescription refill request for Eliquis received. Pt came to lab appointment and got updated labs.   Last office visit: 04/10/2019, Nishan Scr: 1.07, 08/05/2018 Age: 86 y.o. Weight: 87.1 kg   Prescription refill sent to [facility] order. Per phone note on 02/04/2018, pt is to continue to on Eliquis 2.5 mg BID even though she qualifies for Eliquis 5mg  BID.

## 2019-08-06 NOTE — Addendum Note (Signed)
Addended by: Mellody Dance B on: 08/06/2019 04:49 PM   Modules accepted: Orders

## 2019-08-31 ENCOUNTER — Other Ambulatory Visit: Payer: Self-pay | Admitting: *Deleted

## 2019-08-31 ENCOUNTER — Other Ambulatory Visit: Payer: Self-pay | Admitting: Cardiovascular Disease

## 2019-08-31 MED ORDER — ISOSORBIDE MONONITRATE ER 30 MG PO TB24: 30.0000 mg | ORAL_TABLET | Freq: Every day | ORAL | 2 refills | Status: DC

## 2019-08-31 NOTE — Telephone Encounter (Signed)
Eliquis 2.5mg  refill request received, pt is 86 yrs old, weight-87.1kg, Crea-1.07 on 08/06/2019, Diagnosis-Afib, and last seen by Dr. Eden Emms on 04/10/2019. Dose is not per recommended dose criteria, however, per Dr. Eden Emms on 02/04/2018: staff message he prefer Dana Morrison stay on this lower dose because Dana Morrison is on plavix for CAD.  Refill was sent on 08/06/2019 for a year to [facility] and [facility]-will call pt to clarify if Dana Morrison needs it locally.  Spoke with pt and Dana Morrison states Dana Morrison does not need her Eliquis med sent locally and Dana Morrison has been receiving from [facility]. Dana Morrison is aware I will deny [facility].

## 2019-09-06 ENCOUNTER — Ambulatory Visit: Payer: Medicare HMO | Attending: Internal Medicine

## 2019-09-06 DIAGNOSIS — Z23 Encounter for immunization: Secondary | ICD-10-CM | POA: Insufficient documentation

## 2019-09-06 NOTE — Progress Notes (Signed)
   Covid-19 Vaccination Clinic  Name:  Dana Morrison    MRN: [id number] DOB: May 20, 1924  09/06/2019  [name] was observed post Covid-19 immunization for 15 minutes without incidence. She was provided with Vaccine Information Sheet and instruction to access the V-Safe system.   [name] was instructed to call 911 with any severe reactions post vaccine: Marland Kitchen Difficulty breathing  . Swelling of your face and throat  . A fast heartbeat  . A bad rash all over your body  . Dizziness and weakness    Immunizations Administered    Name Date Dose VIS Date Route   Pfizer COVID-19 Vaccine 09/06/2019  9:49 AM 0.3 mL 06/19/2019 Intramuscular   Manufacturer: [employer], [employer]   Lot: [id number]   NDC: [address]-0

## 2019-09-28 ENCOUNTER — Ambulatory Visit: Payer: Medicare HMO | Attending: Internal Medicine

## 2019-09-28 DIAGNOSIS — Z23 Encounter for immunization: Secondary | ICD-10-CM

## 2019-09-28 NOTE — Progress Notes (Signed)
   Covid-19 Vaccination Clinic  Name:  Dana Morrison    MRN: [id number] DOB: [birth year]  09/28/2019  Dana Morrison was observed post Covid-19 immunization for 15 minutes without incident. She was provided with Vaccine Information Sheet and instruction to access the V-Safe system.   Dana Morrison was instructed to call 911 with any severe reactions post vaccine: Marland Kitchen Difficulty breathing  . Swelling of face and throat  . A fast heartbeat  . A bad rash all over body  . Dizziness and weakness   Immunizations Administered    Name Date Dose VIS Date Route   Pfizer COVID-19 Vaccine 09/28/2019  9:13 AM 0.3 mL 06/19/2019 Intramuscular   Manufacturer: [employer], [employer]   Lot: [id number]   NDC: [address]-4

## 2019-09-30 ENCOUNTER — Other Ambulatory Visit: Payer: Self-pay | Admitting: Cardiovascular Disease

## 2019-09-30 NOTE — Telephone Encounter (Signed)
Eliquis 2.5mg  refill request received. Pt is 86yrs old, weight-87.1kg, Crea-1.07 on 1/82021, Diagnosis-Afib,  and last seen by Dr. Eden Emms on 04/10/2019. Dose was approved per Dr. Eden Emms on 02/04/2018 because pt is also on Plavix for CAD and he prefers her on the lower dose. Will send in refill to requested pharmacy.

## 2019-10-08 ENCOUNTER — Telehealth: Payer: Self-pay | Admitting: Cardiovascular Disease

## 2019-10-08 MED ORDER — METOPROLOL SUCCINATE ER 25 MG PO TB24: 25.0000 mg | ORAL_TABLET | Freq: Every day | ORAL | 1 refills | Status: DC

## 2019-10-08 MED ORDER — BUMETANIDE 1 MG PO TABS: 1.0000 mg | ORAL_TABLET | Freq: Every day | ORAL | 1 refills | Status: DC

## 2019-10-08 MED ORDER — MULTAQ 400 MG PO TABS: 400.0000 mg | ORAL_TABLET | Freq: Two times a day (BID) | ORAL | 1 refills | Status: DC

## 2019-10-08 NOTE — Telephone Encounter (Signed)
Pt's medications were sent to pt's pharmacy as requested. Confirmation received.  

## 2019-10-08 NOTE — Telephone Encounter (Signed)
New Message   *STAT* If patient is at the pharmacy, call can be transferred to refill team.   1. Which medications need to be refilled? (please list name of each medication and dose if known)  bumetanide (BUMEX) 1 MG tablet,dronedarone (MULTAQ) 400 MG tablet, metoprolol succinate (TOPROL-XL) 25 MG 24 hr tablet   2. Which pharmacy/location (including street and city if local pharmacy) is medication to be sent to? [facility] [address] [place], [place] [address]  3. Do they need a 30 day or 90 day supply? 90 day

## 2019-10-13 ENCOUNTER — Ambulatory Visit (INDEPENDENT_AMBULATORY_CARE_PROVIDER_SITE_OTHER): Payer: Medicare HMO | Admitting: *Deleted

## 2019-10-13 DIAGNOSIS — I480 Paroxysmal atrial fibrillation: Secondary | ICD-10-CM | POA: Diagnosis not present

## 2019-10-14 ENCOUNTER — Telehealth: Payer: Self-pay

## 2019-10-14 LAB — CUP PACEART REMOTE DEVICE CHECK
Battery Remaining Longevity: 119 mo
Battery Remaining Percentage: 95.5 %
Battery Voltage: 2.98 V
Brady Statistic AP VP Percent: 11 %
Brady Statistic AP VS Percent: 4.6 %
Brady Statistic AS VP Percent: 52 %
Brady Statistic AS VS Percent: 31 %
Brady Statistic RA Percent Paced: 15 %
Brady Statistic RV Percent Paced: 64 %
Date Time Interrogation Session: 20210407015013
Implantable Lead Implant Date: 20161230
Implantable Lead Implant Date: 20161230
Implantable Lead Location: 753859
Implantable Lead Location: 753860
Implantable Pulse Generator Implant Date: 20161230
Lead Channel Impedance Value: 460 Ohm
Lead Channel Impedance Value: 540 Ohm
Lead Channel Pacing Threshold Amplitude: 1 V
Lead Channel Pacing Threshold Amplitude: 1.25 V
Lead Channel Pacing Threshold Pulse Width: 0.4 ms
Lead Channel Pacing Threshold Pulse Width: 0.4 ms
Lead Channel Sensing Intrinsic Amplitude: 2.6 mV
Lead Channel Sensing Intrinsic Amplitude: 6.6 mV
Lead Channel Setting Pacing Amplitude: 2 V
Lead Channel Setting Pacing Amplitude: 2.5 V
Lead Channel Setting Pacing Pulse Width: 0.4 ms
Lead Channel Setting Sensing Sensitivity: 1 mV
Pulse Gen Model: 2240
Pulse Gen Serial Number: 7845839

## 2019-10-14 NOTE — Telephone Encounter (Addendum)
Scheduled remote transmission received-  Quastionable RV non capture on presenting EGM, pt has notable increase in V pacing.  Need pt to come in for in-persons check.    Spoke with pt, pt reportedly feels fine, has no current cardiac complaints.   She is unable to get transportation this week.  Scheduled pt for APP appt with AT, PA on 4/12 at 1pm, she will check with her family to confirm transportation and call to confirm appt.  Device clinic phone # provided.

## 2019-10-15 NOTE — Telephone Encounter (Signed)
Patient called back to confirm that she was able to find transportation for appointment on Monday. Pt aware of visitor restrictions. Denies additional questions or concerns at this time.

## 2019-10-15 NOTE — Telephone Encounter (Signed)
Discussed with Bonna Gains, [employer] representative. [employer] tech services able to zoom in on presenting EGM. VP events are capturing, but are not visible on channel due to auto gain. Pt's VS events are 47mV in amplitude vs VP events, which are 0.2-0.59mV in amplitude, so the gain cannot adjust fast enough between events. Recommendations are to fix the RV sensitivity and reprogram V EGM storage to bipolar (vs V sense amp), as well as consider RV autocapture. [employer] rep will be present for pt's appointment on 10/19/19 at 1:00pm.

## 2019-10-16 NOTE — Telephone Encounter (Signed)
Thank you! Appt is at 1235 now, do you know who is coming that I can update on the time change?

## 2019-10-19 ENCOUNTER — Ambulatory Visit: Payer: Medicare HMO | Admitting: Student

## 2019-10-19 ENCOUNTER — Encounter: Payer: Self-pay | Admitting: Student

## 2019-10-19 ENCOUNTER — Ambulatory Visit
Admission: RE | Admit: 2019-10-19 | Discharge: 2019-10-19 | Disposition: A | Discharge status: Home / Self Care | Payer: Medicare HMO | Source: Ambulatory Visit | Attending: Student | Admitting: Student

## 2019-10-19 ENCOUNTER — Other Ambulatory Visit: Payer: Self-pay

## 2019-10-19 VITALS — BP 106/62 | HR 61 | Ht 67.0 in | Wt 185.8 lb

## 2019-10-19 DIAGNOSIS — I495 Sick sinus syndrome: Secondary | ICD-10-CM

## 2019-10-19 DIAGNOSIS — I509 Heart failure, unspecified: Secondary | ICD-10-CM | POA: Diagnosis not present

## 2019-10-19 DIAGNOSIS — I480 Paroxysmal atrial fibrillation: Secondary | ICD-10-CM | POA: Diagnosis not present

## 2019-10-19 DIAGNOSIS — T82110S Breakdown (mechanical) of cardiac electrode, sequela: Secondary | ICD-10-CM

## 2019-10-19 DIAGNOSIS — I5032 Chronic diastolic (congestive) heart failure: Secondary | ICD-10-CM

## 2019-10-19 LAB — CUP PACEART INCLINIC DEVICE CHECK
Battery Remaining Longevity: 103 mo
Battery Voltage: 2.98 V
Brady Statistic RA Percent Paced: 3.5 %
Brady Statistic RV Percent Paced: 96 %
Date Time Interrogation Session: 20210412203328
Implantable Lead Implant Date: 20161230
Implantable Lead Implant Date: 20161230
Implantable Lead Location: 753859
Implantable Lead Location: 753860
Implantable Pulse Generator Implant Date: 20161230
Lead Channel Impedance Value: 487.5 Ohm
Lead Channel Impedance Value: 550 Ohm
Lead Channel Pacing Threshold Amplitude: 0.75 V
Lead Channel Pacing Threshold Amplitude: 0.75 V
Lead Channel Pacing Threshold Amplitude: 1.625 V
Lead Channel Pacing Threshold Amplitude: 1.75 V
Lead Channel Pacing Threshold Pulse Width: 0.4 ms
Lead Channel Pacing Threshold Pulse Width: 0.4 ms
Lead Channel Pacing Threshold Pulse Width: 0.8 ms
Lead Channel Pacing Threshold Pulse Width: 1 ms
Lead Channel Sensing Intrinsic Amplitude: 2.3 mV
Lead Channel Sensing Intrinsic Amplitude: 3.2 mV
Lead Channel Setting Pacing Amplitude: 1.875
Lead Channel Setting Pacing Amplitude: 2 V
Lead Channel Setting Pacing Pulse Width: 1 ms
Lead Channel Setting Sensing Sensitivity: 2 mV
Pulse Gen Model: 2240
Pulse Gen Serial Number: 7845839

## 2019-10-19 NOTE — Progress Notes (Signed)
Electrophysiology Office Note Date: 10/19/2019  ID:  Jonnie Kubly, DOB [birth year], MRN [id number]  PCP: Kristen Loader, FNP Primary Cardiologist: Jenkins Rouge, MD Electrophysiologist: Dr. Lovena Le  CC: Pacemaker follow-up  Dana Morrison is a 86 y.o. female seen today for Dr. Lovena Le . she presents today for routine electrophysiology followup.  Since last being seen in our clinic, the patient reports doing very well.  she denies chest pain, palpitations, dyspnea, PND, orthopnea, nausea, vomiting, dizziness, syncope, edema, weight gain, or early satiety.  Seen in person today due to discrepancy on EGMs.  And ? V capturing. Tech services able to zoom in and VP events ARE capturing, but not visible on channel due to auto gain. VS events are 38mV in amplitude, where VP events are only 0.2 - 0.3 mV in amplitude.   Pt seen today to fix RV sensitivity and reprogram V EGM storage to bipolar (vs V sense amp) and consider RV autocapture. Changes made below.  Device History: St. Jude Dual Chamber PPM implanted 07/08/15 for SND  Past Medical History:  Diagnosis Date  . Atrial fibrillation ([place])   . Chronic diastolic heart failure ([place]) 09/12/2017  . Congestive heart failure (CHF) ([place])    estimated ejection fraction is 85%; diastolic  . Coronary artery disease   . Decreased vision 09/12/2017  . Hx of myocardial infarction 2010  . Hypertension   . Osteoarthritis of hip 09/12/2017  . Osteoarthritis of knee 09/12/2017  . Paroxysmal atrial fibrillation ([place]) 09/12/2017  . Scalp psoriasis 09/12/2017  . Seasonal allergic rhinitis due to pollen 09/12/2017  . Urticaria 09/12/2017  . Venous insufficiency 09/12/2017   Past Surgical History:  Procedure Laterality Date  . APPENDECTOMY    . ARM FRACTURE Left   . FRACTURE SURGERY Bilateral    WRISTS  . LAPAROSCOPIC LYSIS OF ADHESIONS    . PACEMAKER INSERTION    . PLACEMENT OF STENTS     X5  . REPLACEMENT TOTAL KNEE    . REVISION TOTAL HIP ARTHROPLASTY Left      Current Outpatient Medications  Medication Sig Dispense Refill  . aspirin 81 MG chewable tablet Chew 1 tablet by mouth daily.    . bumetanide (BUMEX) 1 MG tablet Take 1 tablet (1 mg total) by mouth daily. 90 tablet 1  . dronedarone (MULTAQ) 400 MG tablet Take 1 tablet (400 mg total) by mouth 2 (two) times daily with a meal. 180 tablet 1  . ELIQUIS 2.5 MG TABS tablet TAKE 1 TABLET BY MOUTH TWICE A DAY 60 tablet 6  . isosorbide mononitrate (IMDUR) 30 MG 24 hr tablet Take 1 tablet (30 mg total) by mouth daily. 90 tablet 2  . metoprolol succinate (TOPROL-XL) 25 MG 24 hr tablet Take 1 tablet (25 mg total) by mouth daily. 90 tablet 1  . Multiple Vitamin (MULTIVITAMIN) capsule Take 1 capsule by mouth daily.    . nitroGLYCERIN (NITROSTAT) 0.4 MG SL tablet Place 0.4 mg under the tongue every 5 (five) minutes as needed for chest pain.    . pravastatin (PRAVACHOL) 20 MG tablet Take 1 tablet (20 mg total) by mouth daily. 90 tablet 1   No current facility-administered medications for this visit.    Allergies:   Psyllium, Gluten meal, and Other   Social History: Social History   Socioeconomic History  . Marital status: Widowed    Spouse name: Not on file  . Number of children: Not on file  . Years of education: Not on file  .  Highest education level: Not on file  Occupational History  . Not on file  Tobacco Use  . Smoking status: Former Games developer  . Smokeless tobacco: Never Used  Substance and Sexual Activity  . Alcohol use: Yes    Comment: RARE  . Drug use: No  . Sexual activity: Never  Other Topics Concern  . Not on file  Social History Narrative  . Not on file   Social Determinants of Health   Financial Resource Strain:   . Difficulty of Paying Living Expenses:   Food Insecurity:   . Worried About Programme researcher, broadcasting/film/video in the Last Year:   . Barista in the Last Year:   Transportation Needs:   . Freight forwarder (Medical):   Marland Kitchen Lack of Transportation (Non-Medical):    Physical Activity:   . Days of Exercise per Week:   . Minutes of Exercise per Session:   Stress:   . Feeling of Stress :   Social Connections:   . Frequency of Communication with Friends and Family:   . Frequency of Social Gatherings with Friends and Family:   . Attends Religious Services:   . Active Member of Clubs or Organizations:   . Attends Banker Meetings:   Marland Kitchen Marital Status:   Intimate Partner Violence:   . Fear of Current or Ex-Partner:   . Emotionally Abused:   Marland Kitchen Physically Abused:   . Sexually Abused:     Family History: Family History  Problem Relation Age of Onset  . Microcephaly Mother   . Heart attack Mother   . Heart attack Father   . Lung cancer Brother   . Other Brother        BACK PROBLEMS  . Healthy Daughter   . Healthy Daughter   . Healthy Daughter   . Healthy Son      Review of Systems: All other systems reviewed and are otherwise negative except as noted above.  Physical Exam: Vitals:   10/19/19 1249  BP: 106/62  Pulse: 61  SpO2: 95%  Weight: 185 lb 12.8 oz (84.3 kg)  Height: 5\' 7"  (1.702 m)     GEN- The patient is well appearing, alert and oriented x 3 today.   HEENT: normocephalic, atraumatic; sclera clear, conjunctiva pink; hearing intact; oropharynx clear; neck supple  Lungs- Clear to ausculation bilaterally, normal work of breathing.  No wheezes, rales, rhonchi Heart- Regular rate and rhythm, no murmurs, rubs or gallops  GI- soft, non-tender, non-distended, bowel sounds present  Extremities- no clubbing, cyanosis, or edema  MS- no significant deformity or atrophy Skin- warm and dry, no rash or lesion; PPM pocket well healed Psych- euthymic mood, full affect Neuro- strength and sensation are intact  PPM Interrogation- reviewed in detail today,  See PACEART report  EKG:  EKG is ordered today. The ekg is not ordered today.   Recent Labs: 08/06/2019: BUN 24; Creatinine, Ser 1.07; Hemoglobin 13.6; Platelets 189;  Potassium 4.2; Sodium 142   Wt Readings from Last 3 Encounters:  10/19/19 185 lb 12.8 oz (84.3 kg)  04/10/19 192 lb (87.1 kg)  02/27/18 191 lb 3.2 oz (86.7 kg)     Other studies Reviewed: Additional studies/ records that were reviewed today include: Previous EP office notes, Previous remote checks, Most recent labwork.   Assessment and Plan:  1. SND s/p St. Jude PPM  See Pace Art report RV capture elevated to 2.5V @ 0.4 ms today. Changed to auto capture at 1.0  ms pulse width (threshold 1.625 mV).  RV EGM storage changed to Bipolar from V sense amp VIP turned off as pt dependent today.  2. PAF Burden <1% on device She will continue drondenderone Continue appropriately dosed eliquis for CHA2DS2VASC of at least 6    Current medicines are reviewed at length with the patient today.   The patient does not have concerns regarding her medicines.  The following changes were made today:  none  Labs/ tests ordered today include:  Orders Placed This Encounter  Procedures  . DG Chest 2 View  . CUP PACEART INCLINIC DEVICE CHECK   Disposition:   Follow up with Dr. Ladona Ridgel in 3 Months to further assess leads. Will also discuss CXR once available.   Dustin Flock, PA-C  10/19/2019 8:53 PM  [facility] [address] [place] [address] [phone] (office) [phone] (fax)

## 2019-10-19 NOTE — Patient Instructions (Addendum)
Medication Instructions:  NONE *If you need a refill on your cardiac medications before your next appointment, please call your pharmacy*   Lab Work: NONE If you have labs (blood work) drawn today and your tests are completely normal, you will receive your results only by: Marland Kitchen MyChart Message (if you have MyChart) OR . A paper copy in the mail If you have any lab test that is abnormal or we need to change your treatment, we will call you to review the results.   Testing/Procedures:  Chest Xray A chest x-ray takes a picture of the organs and structures inside the chest, including the heart, lungs, and blood vessels. This test can show several things, including, whether the heart is enlarges; whether fluid is building up in the lungs; and whether pacemaker / defibrillator leads are still in place.  [facility] [address]   Follow-Up: At [facility], you and your health needs are our priority.  As part of our continuing mission to provide you with exceptional heart care, we have created designated Provider Care Teams.  These Care Teams include your primary Cardiologist (physician) and Advanced Practice Providers (APPs -  Physician Assistants and Nurse Practitioners) who all work together to provide you with the care you need, when you need it.  Your next appointment:   3 MONTHS  The format for your next appointment:   Either In Person or Virtual  Provider:   Dr Ladona Ridgel   Other Instructions Remote monitoring is used to monitor your Pacemaker from home. This monitoring reduces the number of office visits required to check your device to one time per year. It allows [place] to keep an eye on the functioning of your device to ensure it is working properly. You are scheduled for a device check from home on 01/12/20. You may send your transmission at any time that day. If you have a wireless device, the transmission will be sent automatically. After your physician reviews  your transmission, you will receive a postcard with your next transmission date.

## 2019-12-24 DIAGNOSIS — I10 Essential (primary) hypertension: Secondary | ICD-10-CM | POA: Diagnosis not present

## 2019-12-24 DIAGNOSIS — I4891 Unspecified atrial fibrillation: Secondary | ICD-10-CM | POA: Diagnosis not present

## 2019-12-24 DIAGNOSIS — I509 Heart failure, unspecified: Secondary | ICD-10-CM | POA: Diagnosis not present

## 2019-12-24 DIAGNOSIS — K9049 Malabsorption due to intolerance, not elsewhere classified: Secondary | ICD-10-CM | POA: Diagnosis not present

## 2019-12-24 DIAGNOSIS — R197 Diarrhea, unspecified: Secondary | ICD-10-CM | POA: Diagnosis not present

## 2020-01-12 ENCOUNTER — Ambulatory Visit (INDEPENDENT_AMBULATORY_CARE_PROVIDER_SITE_OTHER): Payer: Medicare HMO | Admitting: *Deleted

## 2020-01-12 DIAGNOSIS — I480 Paroxysmal atrial fibrillation: Secondary | ICD-10-CM | POA: Diagnosis not present

## 2020-01-13 LAB — CUP PACEART REMOTE DEVICE CHECK
Battery Remaining Longevity: 107 mo
Battery Remaining Percentage: 95.5 %
Battery Voltage: 2.96 V
Brady Statistic AP VP Percent: 8.5 %
Brady Statistic AP VS Percent: 1 %
Brady Statistic AS VP Percent: 87 %
Brady Statistic AS VS Percent: 2.8 %
Brady Statistic RA Percent Paced: 6.9 %
Brady Statistic RV Percent Paced: 96 %
Date Time Interrogation Session: 20210707023356
Implantable Lead Implant Date: 20161230
Implantable Lead Implant Date: 20161230
Implantable Lead Location: 753859
Implantable Lead Location: 753860
Implantable Pulse Generator Implant Date: 20161230
Lead Channel Impedance Value: 480 Ohm
Lead Channel Impedance Value: 630 Ohm
Lead Channel Pacing Threshold Amplitude: 0.75 V
Lead Channel Pacing Threshold Amplitude: 1.625 V
Lead Channel Pacing Threshold Pulse Width: 0.4 ms
Lead Channel Pacing Threshold Pulse Width: 1 ms
Lead Channel Sensing Intrinsic Amplitude: 12 mV
Lead Channel Sensing Intrinsic Amplitude: 2.6 mV
Lead Channel Setting Pacing Amplitude: 1.875
Lead Channel Setting Pacing Amplitude: 2 V
Lead Channel Setting Pacing Pulse Width: 1 ms
Lead Channel Setting Sensing Sensitivity: 2 mV
Pulse Gen Model: 2240
Pulse Gen Serial Number: 7845839

## 2020-01-14 NOTE — Progress Notes (Signed)
Remote pacemaker transmission.   

## 2020-02-03 DIAGNOSIS — A09 Infectious gastroenteritis and colitis, unspecified: Secondary | ICD-10-CM | POA: Diagnosis not present

## 2020-02-03 DIAGNOSIS — I10 Essential (primary) hypertension: Secondary | ICD-10-CM | POA: Diagnosis not present

## 2020-02-03 DIAGNOSIS — I4891 Unspecified atrial fibrillation: Secondary | ICD-10-CM | POA: Diagnosis not present

## 2020-02-03 DIAGNOSIS — Z0000 Encounter for general adult medical examination without abnormal findings: Secondary | ICD-10-CM | POA: Diagnosis not present

## 2020-02-03 DIAGNOSIS — I509 Heart failure, unspecified: Secondary | ICD-10-CM | POA: Diagnosis not present

## 2020-02-03 DIAGNOSIS — K529 Noninfective gastroenteritis and colitis, unspecified: Secondary | ICD-10-CM | POA: Diagnosis not present

## 2020-02-03 DIAGNOSIS — E782 Mixed hyperlipidemia: Secondary | ICD-10-CM | POA: Diagnosis not present

## 2020-02-03 DIAGNOSIS — R197 Diarrhea, unspecified: Secondary | ICD-10-CM | POA: Diagnosis not present

## 2020-02-08 DIAGNOSIS — A09 Infectious gastroenteritis and colitis, unspecified: Secondary | ICD-10-CM | POA: Diagnosis not present

## 2020-03-09 DIAGNOSIS — K529 Noninfective gastroenteritis and colitis, unspecified: Secondary | ICD-10-CM | POA: Diagnosis not present

## 2020-03-11 DIAGNOSIS — K529 Noninfective gastroenteritis and colitis, unspecified: Secondary | ICD-10-CM | POA: Diagnosis not present

## 2020-03-17 ENCOUNTER — Other Ambulatory Visit: Payer: Self-pay | Admitting: Cardiovascular Disease

## 2020-03-17 NOTE — Telephone Encounter (Signed)
*  STAT* If patient is at the pharmacy, call can be transferred to refill team.   1. Which medications need to be refilled? (please list name of each medication and dose if known) ELIQUIS 2.5 MG TABS tablet  2. Which pharmacy/location (including street and city if local pharmacy) is medication to be sent to? [id number] [place], [place] - [address]  3. Do they need a 30 day or 90 day supply? 30 day supply

## 2020-03-17 NOTE — Telephone Encounter (Signed)
Prescription refill request for Eliquis received. Indication: PAF Last office visit: 10/19/19 Scr: 1.02 on 02/03/20 Age: 86 Weight: 83 kg on 03/09/20  Dr Eden Emms, previously you preferred this patient to be on low dose Eliquis because she was also on plavix for CAD despite qualifying for the 5mg  BID dose.  Plavix has since been discontinued.  Continue low dose Eliquis or increase to 5mg ?

## 2020-03-18 MED ORDER — APIXABAN 5 MG PO TABS: 5.0000 mg | ORAL_TABLET | Freq: Two times a day (BID) | ORAL | 5 refills | Status: DC

## 2020-03-18 NOTE — Telephone Encounter (Signed)
Can change to 5 mg bid dosing thanks

## 2020-03-18 NOTE — Telephone Encounter (Signed)
Called patient to let her know that her dose of Eliquis will be changing per Dr. Eden Emms. Patient was previously on lower dose of Eliquis (dispite qualifying for 5mg  BID) bc she was on plavix. Patient no longer on plavix therefore per Dr. increase Eliquis back to 5mg  BID.  LVM for patient to call back  Also spoke to Son and made him aware of the change

## 2020-03-31 ENCOUNTER — Other Ambulatory Visit: Payer: Self-pay | Admitting: Cardiovascular Disease

## 2020-04-07 ENCOUNTER — Other Ambulatory Visit: Payer: Self-pay | Admitting: Cardiovascular Disease

## 2020-04-12 ENCOUNTER — Ambulatory Visit (INDEPENDENT_AMBULATORY_CARE_PROVIDER_SITE_OTHER): Payer: Medicare HMO

## 2020-04-12 DIAGNOSIS — I480 Paroxysmal atrial fibrillation: Secondary | ICD-10-CM | POA: Diagnosis not present

## 2020-04-13 LAB — CUP PACEART REMOTE DEVICE CHECK
Battery Remaining Longevity: 107 mo
Battery Remaining Percentage: 95.5 %
Battery Voltage: 2.96 V
Brady Statistic AP VP Percent: 15 %
Brady Statistic AP VS Percent: 1 %
Brady Statistic AS VP Percent: 82 %
Brady Statistic AS VS Percent: 1.7 %
Brady Statistic RA Percent Paced: 13 %
Brady Statistic RV Percent Paced: 97 %
Date Time Interrogation Session: 20211005040018
Implantable Lead Implant Date: 20161230
Implantable Lead Implant Date: 20161230
Implantable Lead Location: 753859
Implantable Lead Location: 753860
Implantable Pulse Generator Implant Date: 20161230
Lead Channel Impedance Value: 440 Ohm
Lead Channel Impedance Value: 680 Ohm
Lead Channel Pacing Threshold Amplitude: 0.75 V
Lead Channel Pacing Threshold Amplitude: 1.125 V
Lead Channel Pacing Threshold Pulse Width: 0.4 ms
Lead Channel Pacing Threshold Pulse Width: 1 ms
Lead Channel Sensing Intrinsic Amplitude: 12 mV
Lead Channel Sensing Intrinsic Amplitude: 2.4 mV
Lead Channel Setting Pacing Amplitude: 1.375
Lead Channel Setting Pacing Amplitude: 2 V
Lead Channel Setting Pacing Pulse Width: 1 ms
Lead Channel Setting Sensing Sensitivity: 2 mV
Pulse Gen Model: 2240
Pulse Gen Serial Number: 7845839

## 2020-04-14 NOTE — Progress Notes (Signed)
Remote pacemaker transmission.   

## 2020-05-03 ENCOUNTER — Other Ambulatory Visit: Payer: Self-pay

## 2020-05-03 MED ORDER — METOPROLOL SUCCINATE ER 25 MG PO TB24
25.0000 mg | ORAL_TABLET | Freq: Every day | ORAL | 0 refills | Status: DC
Start: 2020-05-03 — End: 2020-09-09

## 2020-05-03 NOTE — Telephone Encounter (Signed)
Pt's medication was sent to pt's pharmacy as requested. Confirmation received.  °

## 2020-05-26 ENCOUNTER — Other Ambulatory Visit: Payer: Self-pay | Admitting: Cardiovascular Disease

## 2020-05-26 NOTE — Telephone Encounter (Signed)
Prescription refill request for Eliquis received.  Last office visit: [place], 10/19/2019 Scr: 1.07, 08/06/2019 Age: 86 Weight: 84.3

## 2020-05-27 ENCOUNTER — Other Ambulatory Visit: Payer: Self-pay | Admitting: Cardiovascular Disease

## 2020-05-30 ENCOUNTER — Other Ambulatory Visit: Payer: Self-pay | Admitting: Cardiovascular Disease

## 2020-05-30 MED ORDER — APIXABAN 5 MG PO TABS
5.0000 mg | ORAL_TABLET | Freq: Two times a day (BID) | ORAL | 1 refills | Status: DC
Start: 2020-05-30 — End: 2020-12-15

## 2020-05-30 NOTE — Telephone Encounter (Signed)
Pt last saw Maxine Glenn, [place] on 10/19/19, last labs 08/06/19 Creat 1.07, age 86, weight 84.3kg, based on specified criteria pt is on appropriate dosage of Eliquis 5mg  BID.  Will refill rx.

## 2020-05-30 NOTE — Telephone Encounter (Signed)
*  STAT* If patient is at the pharmacy, call can be transferred to refill team.   1. Which medications need to be refilled? (please list name of each medication and dose if known)   apixaban (ELIQUIS) 5 MG TABS tablet     2. Which pharmacy/location (including street and city if local pharmacy) is medication to be sent to? [facility] - [place], [place] - [address]  3. Do they need a 30 day or 90 day supply? 90 day supply  Patient states she has a 1 week supply of medication.

## 2020-06-08 ENCOUNTER — Telehealth: Payer: Self-pay

## 2020-06-08 NOTE — Telephone Encounter (Signed)
The pt got a new life alert and it has a strong magnet. She wanted to know if it is okay to wear it with her pacemaker?

## 2020-06-23 ENCOUNTER — Other Ambulatory Visit: Payer: Self-pay

## 2020-06-23 MED ORDER — BUMETANIDE 1 MG PO TABS
1.0000 mg | ORAL_TABLET | Freq: Every day | ORAL | 3 refills | Status: DC
Start: 2020-06-23 — End: 2020-06-24

## 2020-06-24 ENCOUNTER — Telehealth: Payer: Self-pay | Admitting: Internal Medicine

## 2020-06-24 MED ORDER — BUMETANIDE 1 MG PO TABS
1.0000 mg | ORAL_TABLET | Freq: Every day | ORAL | 3 refills | Status: DC
Start: 2020-06-24 — End: 2021-12-07

## 2020-06-24 NOTE — Telephone Encounter (Signed)
[facility]

## 2020-06-24 NOTE — Telephone Encounter (Signed)
  *  STAT* If patient is at the pharmacy, call can be transferred to refill team.   1. Which medications need to be refilled? (please list name of each medication and dose if known)   bumetanide (BUMEX) 1 MG tablet    2. Which pharmacy/location (including street and city if local pharmacy) is medication to be sent to? [id number] [place], [place] - [address]  3. Do they need a 30 day or 90 day supply? 30 days  Pt said she haven't receive refill from [facility], she needs emergency refill since she is out of medication.

## 2020-07-14 ENCOUNTER — Ambulatory Visit (INDEPENDENT_AMBULATORY_CARE_PROVIDER_SITE_OTHER): Payer: Medicare HMO

## 2020-07-14 DIAGNOSIS — I495 Sick sinus syndrome: Secondary | ICD-10-CM | POA: Diagnosis not present

## 2020-07-18 LAB — CUP PACEART REMOTE DEVICE CHECK
Battery Remaining Longevity: 97 mo
Battery Remaining Percentage: 89 %
Battery Voltage: 2.95 V
Brady Statistic AP VP Percent: 9.3 %
Brady Statistic AP VS Percent: 1 %
Brady Statistic AS VP Percent: 90 %
Brady Statistic AS VS Percent: 1 %
Brady Statistic RA Percent Paced: 9 %
Brady Statistic RV Percent Paced: 99 %
Date Time Interrogation Session: 20220109225835
Implantable Lead Implant Date: 20161230
Implantable Lead Implant Date: 20161230
Implantable Lead Location: 753859
Implantable Lead Location: 753860
Implantable Pulse Generator Implant Date: 20161230
Lead Channel Impedance Value: 460 Ohm
Lead Channel Impedance Value: 640 Ohm
Lead Channel Pacing Threshold Amplitude: 0.75 V
Lead Channel Pacing Threshold Amplitude: 1.125 V
Lead Channel Pacing Threshold Pulse Width: 0.4 ms
Lead Channel Pacing Threshold Pulse Width: 1 ms
Lead Channel Sensing Intrinsic Amplitude: 11 mV
Lead Channel Sensing Intrinsic Amplitude: 2.5 mV
Lead Channel Setting Pacing Amplitude: 1.375
Lead Channel Setting Pacing Amplitude: 2 V
Lead Channel Setting Pacing Pulse Width: 1 ms
Lead Channel Setting Sensing Sensitivity: 2 mV
Pulse Gen Model: 2240
Pulse Gen Serial Number: 7845839

## 2020-07-28 NOTE — Progress Notes (Signed)
Remote pacemaker transmission.   

## 2020-08-05 DIAGNOSIS — L729 Follicular cyst of the skin and subcutaneous tissue, unspecified: Secondary | ICD-10-CM | POA: Diagnosis not present

## 2020-08-08 ENCOUNTER — Other Ambulatory Visit: Payer: Self-pay | Admitting: Cardiovascular Disease

## 2020-08-09 DIAGNOSIS — I509 Heart failure, unspecified: Secondary | ICD-10-CM | POA: Diagnosis not present

## 2020-08-09 DIAGNOSIS — R229 Localized swelling, mass and lump, unspecified: Secondary | ICD-10-CM | POA: Diagnosis not present

## 2020-08-25 ENCOUNTER — Other Ambulatory Visit: Payer: Self-pay | Admitting: Cardiovascular Disease

## 2020-08-29 NOTE — Progress Notes (Signed)
Virtual Visit via Video Note   This visit type was conducted due to national recommendations for restrictions regarding the COVID-19 Pandemic (e.g. social distancing) in an effort to limit this patient's exposure and mitigate transmission in our community.  Due to her co-morbid illnesses, this patient is at least at moderate risk for complications without adequate follow up.  This format is felt to be most appropriate for this patient at this time.  All issues noted in this document were discussed and addressed.  A limited physical exam was performed with this format.  Please refer to the patient's chart for her consent to telehealth for [facility].   Patient location: Office Physician location: Home Date:  08/31/2020   ID:  Dana Morrison, DOB Mar 29, 1924, MRN [id number]  PCP:  Soundra Pilon, FNP  Cardiologist:   Charlton Haws, MD     History of Present Illness:  87 y.o. from [place]. CAD most recent stent January 2016, PAF , PPM and HLD She has no chest pain indicates MI in 2010 with stents and repeat in 2016 Chronic dyspnea. Pacer placed 2015 has seen dr Ladona Ridgel for pacer check  She still drives and lives independently  TTE 09/20/17  EF 55-60%  Estimated PA 35 mmHg no significant valve disease   Has had PAF Rx by Dr Ladona Ridgel. On low dose eliquis, lopressor and multaq.   Had some high RV capture readings and PPM reprogrammed 10/19/19 PAF burden At that time <1%   CXR done then NAD   Her son is an Psychologist, educational professor at [employer]. Two grand daughters here.  Living at [place] Has been vaccinated No angina, Has PPM f/u and f/u with her primary for lab work coming up  Past Medical History:  Diagnosis Date  . Atrial fibrillation ([facility])   . Chronic diastolic heart failure ([facility]) 09/12/2017  . Congestive heart failure (CHF) ([facility])    estimated ejection fraction is 54%; diastolic  . Coronary artery disease   . Decreased vision 09/12/2017  . Hx of myocardial infarction 2010  . Hypertension   .  Osteoarthritis of hip 09/12/2017  . Osteoarthritis of knee 09/12/2017  . Paroxysmal atrial fibrillation ([facility]) 09/12/2017  . Scalp psoriasis 09/12/2017  . Seasonal allergic rhinitis due to pollen 09/12/2017  . Urticaria 09/12/2017  . Venous insufficiency 09/12/2017    Past Surgical History:  Procedure Laterality Date  . APPENDECTOMY    . ARM FRACTURE Left   . FRACTURE SURGERY Bilateral    WRISTS  . LAPAROSCOPIC LYSIS OF ADHESIONS    . PACEMAKER INSERTION    . PLACEMENT OF STENTS     X5  . REPLACEMENT TOTAL KNEE    . REVISION TOTAL HIP ARTHROPLASTY Left      Current Outpatient Medications  Medication Sig Dispense Refill  . apixaban (ELIQUIS) 5 MG TABS tablet Take 1 tablet (5 mg total) by mouth 2 (two) times daily. 180 tablet 1  . aspirin 81 MG chewable tablet Chew 1 tablet by mouth daily.    . bumetanide (BUMEX) 1 MG tablet Take 1 tablet (1 mg total) by mouth daily. 90 tablet 3  . cholestyramine (QUESTRAN) 4 g packet 1 packet mixed with water or non-carbonated drink    . isosorbide mononitrate (IMDUR) 30 MG 24 hr tablet Take 1 tablet (30 mg total) by mouth daily. 90 tablet 2  . metoprolol succinate (TOPROL-XL) 25 MG 24 hr tablet Take 1 tablet (25 mg total) by mouth daily. Please make overdue appt with Dr.  Tashi Andujo before anymore refills. Thank you 2nd attempt 15 tablet 0  . MULTAQ 400 MG tablet TAKE 1 TABLET TWICE DAILY WITH A MEAL 60 tablet 0  . Multiple Vitamin (MULTIVITAMIN) capsule Take 1 capsule by mouth daily.    . nitroGLYCERIN (NITROSTAT) 0.4 MG SL tablet Place 0.4 mg under the tongue every 5 (five) minutes as needed for chest pain.    . pravastatin (PRAVACHOL) 20 MG tablet TAKE 1 TABLET EVERY DAY (NEED MD APPOINTMENT) 30 tablet 11   No current facility-administered medications for this visit.    Allergies:   Psyllium, Gluten meal, and Other    Social History:  The patient  reports that she has quit smoking. She has never used smokeless tobacco. She reports current alcohol use. She  reports that she does not use drugs.   Family History:  The patient's family history includes Healthy in her daughter, daughter, daughter, and son; Heart attack in her father and mother; Lung cancer in her brother; Microcephaly in her mother; Other in her brother.    ROS:  Please see the history of present illness.   Otherwise, review of systems are positive for none.   All other systems are reviewed and negative.    PHYSICAL EXAM: VS:  Wt 83 kg   BMI 28.66 kg/m  , BMI Body mass index is 28.66 kg/m.  BP 132/76 mmHg pulse 80 Wearing glasses No distress No tachypnea No JVP elevation  Son is with her     EKG:  SR rate 76 PR 238 RBBB LAD PVC;s 04/10/19 SR rate 72 LAD RBBB   Recent Labs: No results found for requested labs within last 8760 hours.    Lipid Panel No results found for: CHOL, TRIG, HDL, CHOLHDL, VLDL, LDLCALC, LDLDIRECT    Wt Readings from Last 3 Encounters:  08/31/20 83 kg  10/19/19 84.3 kg  04/10/19 87.1 kg      Other studies Reviewed: Additional studies/ records that were reviewed today include: Notes from primary labs and will review notes from [place] .    ASSESSMENT AND PLAN:  1.  CAD no angina continue ASA and lopressor Plavix d/c  2. PAF <1% asymptomatic  On low dose eliquis lopressor and multaq continue  3. HLD on statin labs with primary  4. PPM:  St Jude - reprogrammed 10/2019 needs f/u GT  5. Dyspnea:  No cardiac EF 55-60% TTE 09/20/17  6. Edema:  Right worse than left Bumex daily with K  Current medicines are reviewed at length with the patient today.  The patient does not have concerns regarding medicines.  The following changes have been made:  no change   No orders of the defined types were placed in this encounter.    Disposition:   FU with EP next available      Signed, Charlton Haws, MD  08/31/2020 9:17 AM    [facility] [address] [place], [place], [place]  [address] Phone: [phone]; Fax: [phone]

## 2020-08-31 ENCOUNTER — Telehealth (INDEPENDENT_AMBULATORY_CARE_PROVIDER_SITE_OTHER): Payer: Medicare HMO | Admitting: Cardiovascular Disease

## 2020-08-31 ENCOUNTER — Other Ambulatory Visit: Payer: Self-pay

## 2020-08-31 ENCOUNTER — Encounter: Payer: Self-pay | Admitting: Cardiovascular Disease

## 2020-08-31 VITALS — BP 136/72 | HR 80 | Wt 183.0 lb

## 2020-08-31 DIAGNOSIS — Z950 Presence of cardiac pacemaker: Secondary | ICD-10-CM | POA: Diagnosis not present

## 2020-08-31 DIAGNOSIS — I480 Paroxysmal atrial fibrillation: Secondary | ICD-10-CM

## 2020-08-31 DIAGNOSIS — I2510 Atherosclerotic heart disease of native coronary artery without angina pectoris: Secondary | ICD-10-CM | POA: Diagnosis not present

## 2020-08-31 NOTE — Patient Instructions (Signed)
Medication Instructions:  *If you need a refill on your cardiac medications before your next appointment, please call your pharmacy*  Lab Work: If you have labs (blood work) drawn today and your tests are completely normal, you will receive your results only by: Marland Kitchen MyChart Message (if you have MyChart) OR . A paper copy in the mail If you have any lab test that is abnormal or we need to change your treatment, we will call you to review the results.  Testing/Procedures: None ordered today.  Follow-Up: At [facility], you and your health needs are our priority.  As part of our continuing mission to provide you with exceptional heart care, we have created designated Provider Care Teams.  These Care Teams include your primary Cardiologist (physician) and Advanced Practice Providers (APPs -  Physician Assistants and Nurse Practitioners) who all work together to provide you with the care you need, when you need it.  We recommend signing up for the patient portal called "MyChart".  Sign up information is provided on this After Visit Summary.  MyChart is used to connect with patients for Virtual Visits (Telemedicine).  Patients are able to view lab/test results, encounter notes, upcoming appointments, etc.  Non-urgent messages can be sent to your provider as well.   To learn more about what you can do with MyChart, go to [email or web address].    Your next appointment:   6 months  The format for your next appointment:   In person  Provider:   Dr. Eden Emms

## 2020-09-08 ENCOUNTER — Other Ambulatory Visit: Payer: Self-pay | Admitting: Cardiovascular Disease

## 2020-09-14 ENCOUNTER — Other Ambulatory Visit: Payer: Self-pay | Admitting: Cardiovascular Disease

## 2020-09-22 ENCOUNTER — Other Ambulatory Visit: Payer: Self-pay

## 2020-09-22 ENCOUNTER — Ambulatory Visit: Payer: Medicare HMO | Admitting: Internal Medicine

## 2020-09-22 VITALS — BP 114/62 | HR 76 | Ht 67.0 in | Wt 182.0 lb

## 2020-09-22 DIAGNOSIS — I495 Sick sinus syndrome: Secondary | ICD-10-CM | POA: Diagnosis not present

## 2020-09-22 DIAGNOSIS — Z950 Presence of cardiac pacemaker: Secondary | ICD-10-CM | POA: Diagnosis not present

## 2020-09-22 DIAGNOSIS — I480 Paroxysmal atrial fibrillation: Secondary | ICD-10-CM

## 2020-09-22 LAB — CUP PACEART INCLINIC DEVICE CHECK
Battery Remaining Longevity: 106 mo
Battery Voltage: 2.96 V
Brady Statistic RA Percent Paced: 16 %
Brady Statistic RV Percent Paced: 99.26 %
Date Time Interrogation Session: 20220317150530
Implantable Lead Implant Date: 20161230
Implantable Lead Implant Date: 20161230
Implantable Lead Location: 753859
Implantable Lead Location: 753860
Implantable Pulse Generator Implant Date: 20161230
Lead Channel Impedance Value: 475 Ohm
Lead Channel Impedance Value: 612.5 Ohm
Lead Channel Pacing Threshold Amplitude: 0.5 V
Lead Channel Pacing Threshold Amplitude: 1.25 V
Lead Channel Pacing Threshold Pulse Width: 0.4 ms
Lead Channel Pacing Threshold Pulse Width: 1 ms
Lead Channel Sensing Intrinsic Amplitude: 12 mV
Lead Channel Sensing Intrinsic Amplitude: 2.8 mV
Lead Channel Setting Pacing Amplitude: 1.5 V
Lead Channel Setting Pacing Amplitude: 2 V
Lead Channel Setting Pacing Pulse Width: 1 ms
Lead Channel Setting Sensing Sensitivity: 2 mV
Pulse Gen Model: 2240
Pulse Gen Serial Number: 7845839

## 2020-09-22 NOTE — Progress Notes (Signed)
HPI [name] returns today for ongoing evaluation and management of her PPM and PAF. She is a pleasant 87yo woman who moved from [place] about 3 years ago. She has done well and lived independently. She denies chest pain but has developed worsening dyspnea. No syncope and no palpitations. She has a h/o chronotropic incompetence and underwent PPM insertion about 3 years ago. She now has CHB. She takes dronenderone for PAF.  Allergies  Allergen Reactions  . Psyllium Diarrhea  . Gluten Meal     Per patient and her son, the need for a gluten free diet is due to some intolerances that she has had for many years. It is not an allergy.   . Other     Per patient she has an intolerance to some legumes. She does not have a true allergy. Pt's son confirmed this as well.      Current Outpatient Medications  Medication Sig Dispense Refill  . apixaban (ELIQUIS) 5 MG TABS tablet Take 1 tablet (5 mg total) by mouth 2 (two) times daily. 180 tablet 1  . aspirin 81 MG chewable tablet Chew 1 tablet by mouth daily.    . bumetanide (BUMEX) 1 MG tablet Take 1 tablet (1 mg total) by mouth daily. 90 tablet 3  . cholestyramine (QUESTRAN) 4 g packet 1 packet mixed with water or non-carbonated drink    . isosorbide mononitrate (IMDUR) 30 MG 24 hr tablet TAKE 1 TABLET EVERY DAY 90 tablet 3  . metoprolol succinate (TOPROL-XL) 25 MG 24 hr tablet TAKE 1 TABLET BY MOUTH EVERY DAY 90 tablet 3  . MULTAQ 400 MG tablet TAKE 1 TABLET TWICE DAILY WITH A MEAL 60 tablet 0  . Multiple Vitamin (MULTIVITAMIN) capsule Take 1 capsule by mouth daily.    . nitroGLYCERIN (NITROSTAT) 0.4 MG SL tablet Place 0.4 mg under the tongue every 5 (five) minutes as needed for chest pain.    . pravastatin (PRAVACHOL) 20 MG tablet TAKE 1 TABLET EVERY DAY (NEED MD APPOINTMENT) 30 tablet 11   No current facility-administered medications for this visit.     Past Medical History:  Diagnosis Date  . Atrial fibrillation (HCC)   .  Chronic diastolic heart failure (HCC) 09/12/2017  . Congestive heart failure (CHF) (HCC)    estimated ejection fraction is 54%; diastolic  . Coronary artery disease   . Decreased vision 09/12/2017  . Hx of myocardial infarction 2010  . Hypertension   . Osteoarthritis of hip 09/12/2017  . Osteoarthritis of knee 09/12/2017  . Paroxysmal atrial fibrillation (HCC) 09/12/2017  . Scalp psoriasis 09/12/2017  . Seasonal allergic rhinitis due to pollen 09/12/2017  . Urticaria 09/12/2017  . Venous insufficiency 09/12/2017    ROS:   All systems reviewed and negative except as noted in the HPI.   Past Surgical History:  Procedure Laterality Date  . APPENDECTOMY    . ARM FRACTURE Left   . FRACTURE SURGERY Bilateral    WRISTS  . LAPAROSCOPIC LYSIS OF ADHESIONS    . PACEMAKER INSERTION    . PLACEMENT OF STENTS     X5  . REPLACEMENT TOTAL KNEE    . REVISION TOTAL HIP ARTHROPLASTY Left      Family History  Problem Relation Age of Onset  . Microcephaly Mother   . Heart attack Mother   . Heart attack Father   . Lung cancer Brother   . Other Brother        BACK PROBLEMS  .  Healthy Daughter   . Healthy Daughter   . Healthy Daughter   . Healthy Son      Social History   Socioeconomic History  . Marital status: Widowed    Spouse name: Not on file  . Number of children: Not on file  . Years of education: Not on file  . Highest education level: Not on file  Occupational History  . Not on file  Tobacco Use  . Smoking status: Former Games developer  . Smokeless tobacco: Never Used  Vaping Use  . Vaping Use: Never used  Substance and Sexual Activity  . Alcohol use: Yes    Comment: RARE  . Drug use: No  . Sexual activity: Never  Other Topics Concern  . Not on file  Social History Narrative  . Not on file   Social Determinants of Health   Financial Resource Strain: Not on file  Food Insecurity: Not on file  Transportation Needs: Not on file  Physical Activity: Not on file  Stress: Not on  file  Social Connections: Not on file  Intimate Partner Violence: Not on file     BP 114/62   Pulse 76   Ht 5\' 7"  (1.702 m)   Wt 182 lb (82.6 kg)   SpO2 96%   BMI 28.51 kg/m   Physical Exam:  Well appearing elderly woman, NAD HEENT: Unremarkable Neck:  No JVD, no thyromegally Lymphatics:  No adenopathy Back:  No CVA tenderness Lungs:  Clear with no wheezes HEART:  Regular rate rhythm, no murmurs, no rubs, no clicks Abd:  soft, positive bowel sounds, no organomegally, no rebound, no guarding Ext:  2 plus pulses, no edema, no cyanosis, no clubbing Skin:  No rashes no nodules Neuro:  CN II through XII intact, motor grossly intact  EKG - nsr with ventricular pacing  DEVICE  Normal device function.  See PaceArt for details.   Assess/Plan: 1. CHB - she is asymptomatic,s/p PPM insertion. She has no escape. 2. Chronic dyspnea - I suspect that she has diastolic heart failure and I have asked her to increase her bumex to twice daily. If her dyspnea improves then she will stay on this going forward and we will check labs in two weeks. If she has no improvement in her dyspnea then she will go back to bumex twice daily.  3. PPM -her St. Jude DDD PM is working normally. 4. PAF - she is maintaining NSR on multaq.   Taylor,MD

## 2020-09-22 NOTE — Patient Instructions (Addendum)
Medication Instructions:  Your physician has recommended you make the following change in your medication:   1.  Take an extra Bumex for one week-one in the morning and one after lunch.  If that improves your breathing- continue to take Bumex twice a day.  Please call me if this helps so I can send a new prescription to your pharmacy and you will need lab work.   Labwork: None ordered.  Testing/Procedures: None ordered.  Follow-Up: Your physician wants you to follow-up in: one year with Lewayne Bunting, MD or one of the following Advanced Practice Providers on your designated Care Team:    Gypsy Balsam, NP  Francis Dowse, PA-C  Casimiro Needle "Mardelle Matte" [place], [place]  Remote monitoring is used to monitor your Pacemaker from home. This monitoring reduces the number of office visits required to check your device to one time per year. It allows [place] to keep an eye on the functioning of your device to ensure it is working properly. You are scheduled for a device check from home on 10/13/2020. You may send your transmission at any time that day. If you have a wireless device, the transmission will be sent automatically. After your physician reviews your transmission, you will receive a postcard with your next transmission date.  Any Other Special Instructions Will Be Listed Below (If Applicable).  If you need a refill on your cardiac medications before your next appointment, please call your pharmacy.

## 2020-10-11 ENCOUNTER — Other Ambulatory Visit: Payer: Self-pay | Admitting: Cardiovascular Disease

## 2020-10-13 ENCOUNTER — Ambulatory Visit (INDEPENDENT_AMBULATORY_CARE_PROVIDER_SITE_OTHER): Payer: Medicare HMO

## 2020-10-13 DIAGNOSIS — I495 Sick sinus syndrome: Secondary | ICD-10-CM

## 2020-10-13 LAB — CUP PACEART REMOTE DEVICE CHECK
Battery Remaining Longevity: 104 mo
Battery Remaining Percentage: 95.5 %
Battery Voltage: 2.96 V
Brady Statistic AP VP Percent: 19 %
Brady Statistic AP VS Percent: 1 %
Brady Statistic AS VP Percent: 80 %
Brady Statistic AS VS Percent: 1 %
Brady Statistic RA Percent Paced: 19 %
Brady Statistic RV Percent Paced: 99 %
Date Time Interrogation Session: 20220407020014
Implantable Lead Implant Date: 20161230
Implantable Lead Implant Date: 20161230
Implantable Lead Location: 753859
Implantable Lead Location: 753860
Implantable Pulse Generator Implant Date: 20161230
Lead Channel Impedance Value: 430 Ohm
Lead Channel Impedance Value: 640 Ohm
Lead Channel Pacing Threshold Amplitude: 0.5 V
Lead Channel Pacing Threshold Amplitude: 1.25 V
Lead Channel Pacing Threshold Pulse Width: 0.4 ms
Lead Channel Pacing Threshold Pulse Width: 1 ms
Lead Channel Sensing Intrinsic Amplitude: 12 mV
Lead Channel Sensing Intrinsic Amplitude: 2.4 mV
Lead Channel Setting Pacing Amplitude: 1.5 V
Lead Channel Setting Pacing Amplitude: 2 V
Lead Channel Setting Pacing Pulse Width: 1 ms
Lead Channel Setting Sensing Sensitivity: 2 mV
Pulse Gen Model: 2240
Pulse Gen Serial Number: 7845839

## 2020-10-25 NOTE — Progress Notes (Signed)
Remote pacemaker transmission.   

## 2020-12-15 ENCOUNTER — Other Ambulatory Visit: Payer: Self-pay | Admitting: Cardiovascular Disease

## 2020-12-15 NOTE — Telephone Encounter (Signed)
Prescription refill request for Eliquis received. Indication: afib Last office visit: Ladona Ridgel 09/22/2020 Scr: 1.02, 02/03/2020 Age: 87 yo Weight:  82.6 kg   Pt is on the correct dose of Eliquis per dosing criteria, prescription refill sent for Eliquis 5mg  BID.

## 2021-01-12 ENCOUNTER — Ambulatory Visit (INDEPENDENT_AMBULATORY_CARE_PROVIDER_SITE_OTHER): Payer: Medicare HMO

## 2021-01-12 DIAGNOSIS — I495 Sick sinus syndrome: Secondary | ICD-10-CM | POA: Diagnosis not present

## 2021-01-12 LAB — CUP PACEART REMOTE DEVICE CHECK
Battery Remaining Longevity: 60 mo
Battery Remaining Percentage: 52 %
Battery Voltage: 2.96 V
Brady Statistic AP VP Percent: 9.3 %
Brady Statistic AP VS Percent: 1 %
Brady Statistic AS VP Percent: 90 %
Brady Statistic AS VS Percent: 1 %
Brady Statistic RA Percent Paced: 8.9 %
Brady Statistic RV Percent Paced: 99 %
Date Time Interrogation Session: 20220707020013
Implantable Lead Implant Date: 20161230
Implantable Lead Implant Date: 20161230
Implantable Lead Location: 753859
Implantable Lead Location: 753860
Implantable Pulse Generator Implant Date: 20161230
Lead Channel Impedance Value: 480 Ohm
Lead Channel Impedance Value: 680 Ohm
Lead Channel Pacing Threshold Amplitude: 0.5 V
Lead Channel Pacing Threshold Amplitude: 1.5 V
Lead Channel Pacing Threshold Pulse Width: 0.4 ms
Lead Channel Pacing Threshold Pulse Width: 1 ms
Lead Channel Sensing Intrinsic Amplitude: 10.1 mV
Lead Channel Sensing Intrinsic Amplitude: 3.4 mV
Lead Channel Setting Pacing Amplitude: 1.75 V
Lead Channel Setting Pacing Amplitude: 2 V
Lead Channel Setting Pacing Pulse Width: 1 ms
Lead Channel Setting Sensing Sensitivity: 2 mV
Pulse Gen Model: 2240
Pulse Gen Serial Number: 7845839

## 2021-02-02 NOTE — Progress Notes (Signed)
Remote pacemaker transmission.   

## 2021-02-27 ENCOUNTER — Telehealth: Payer: Self-pay | Admitting: Cardiovascular Disease

## 2021-02-27 NOTE — Telephone Encounter (Signed)
I s/w Dr. Hermelinda Medicus in regards to pt dental procedure. I was able to confirm with Dr. Hermelinda Medicus that pt will need 2 teeth to be extracted. As far as if urgent in nature Dr. Hermelinda Medicus stated no; though teeth to need to come out soon. He is aware the pt may need to hold Eliquis prior to 2 teeth being extracted. Confirmed local anesthesia being used. I thanked Dr. Hermelinda Medicus for his time and help in this matter. I assure Dr. Hermelinda Medicus that I will update the pre op cardiologist and we will fax clearance over once cleared. Dr. Hermelinda Medicus thanked me for the help as well.

## 2021-02-27 NOTE — Telephone Encounter (Signed)
   Patient Name: Dana Morrison  DOB: 1923/08/29 MRN: [id number]  Primary Cardiologist: Charlton Haws, MD / EP - Dr. Ladona Ridgel  Chart reviewed as part of pre-operative protocol coverage.   Simple dental extractions of 1-2 teeth are considered low risk procedures per guidelines and generally do not require any specific cardiac clearance. It is also generally accepted that for simple extractions and dental cleanings, there is no need to interrupt blood thinner therapy (I.e. do not need to stop Eliquis). If there is a more intensive surgery planned in the future, please contact our office to revisit anticoagulation recommendations.  SBE prophylaxis is not required for the patient from a cardiac standpoint.  I will route this recommendation to the requesting party via Epic fax function and remove from pre-op pool.  Please call with questions.  Laurann Montana, PA-C 02/27/2021, 11:37 AM

## 2021-02-27 NOTE — Telephone Encounter (Signed)
   [facility] Pre-operative Risk Assessment    Patient Name: Beckham Buxbaum  DOB: [birth year] MRN: [id number]  HEARTCARE STAFF:  - IMPORTANT!!!!!! Under Visit Info/Reason for Call, type in Other and utilize the format Clearance MM/DD/YY or Clearance TBD. Do not use dashes or single digits. - Please review there is not already an duplicate clearance open for this procedure. - If request is for dental extraction, please clarify the # of teeth to be extracted. - If the patient is currently at the dentist's office, call Pre-Op Callback Staff (MA/nurse) to input urgent request.  - If the patient is not currently in the dentist office, please route to the Pre-Op pool.  Request for surgical clearance:  What type of surgery is being performed? 2 surgical extractions  When is this surgery scheduled? 02/27/2021  What type of clearance is required (medical clearance vs. Pharmacy clearance to hold med vs. Both)? Both  Are there any medications that need to be held prior to surgery and how long? Hold eliquis, need guidance on how long  Practice name and name of physician performing surgery? [facility], Dr. Tessa Lerner  What is the office phone number? [phone]   7.   What is the office fax number? [phone]  8.   Anesthesia type (None, local, MAC, general) ? local   Selena Zobro 02/27/2021, 10:19 AM  _________________________________________________________________   (provider comments below)    Office also wants to know about a premed before treatment due to patient having a pacemaker.

## 2021-03-09 ENCOUNTER — Other Ambulatory Visit: Payer: Self-pay | Admitting: Cardiovascular Disease

## 2021-03-10 MED ORDER — MULTAQ 400 MG PO TABS: 400.0000 mg | ORAL_TABLET | Freq: Two times a day (BID) | ORAL | 1 refills | Status: DC

## 2021-04-11 DIAGNOSIS — R69 Illness, unspecified: Secondary | ICD-10-CM | POA: Diagnosis not present

## 2021-04-13 ENCOUNTER — Ambulatory Visit (INDEPENDENT_AMBULATORY_CARE_PROVIDER_SITE_OTHER): Payer: Medicare HMO

## 2021-04-13 DIAGNOSIS — I495 Sick sinus syndrome: Secondary | ICD-10-CM

## 2021-04-13 LAB — CUP PACEART REMOTE DEVICE CHECK
Battery Remaining Longevity: 55 mo
Battery Remaining Percentage: 49 %
Battery Voltage: 2.95 V
Brady Statistic AP VP Percent: 10 %
Brady Statistic AP VS Percent: 1 %
Brady Statistic AS VP Percent: 89 %
Brady Statistic AS VS Percent: 1 %
Brady Statistic RA Percent Paced: 9.7 %
Brady Statistic RV Percent Paced: 99 %
Date Time Interrogation Session: 20221006020014
Implantable Lead Implant Date: 20161230
Implantable Lead Implant Date: 20161230
Implantable Lead Location: 753859
Implantable Lead Location: 753860
Implantable Pulse Generator Implant Date: 20161230
Lead Channel Impedance Value: 450 Ohm
Lead Channel Impedance Value: 690 Ohm
Lead Channel Pacing Threshold Amplitude: 0.5 V
Lead Channel Pacing Threshold Amplitude: 1 V
Lead Channel Pacing Threshold Pulse Width: 0.4 ms
Lead Channel Pacing Threshold Pulse Width: 1 ms
Lead Channel Sensing Intrinsic Amplitude: 12 mV
Lead Channel Sensing Intrinsic Amplitude: 3.5 mV
Lead Channel Setting Pacing Amplitude: 1.25 V
Lead Channel Setting Pacing Amplitude: 2 V
Lead Channel Setting Pacing Pulse Width: 1 ms
Lead Channel Setting Sensing Sensitivity: 2 mV
Pulse Gen Model: 2240
Pulse Gen Serial Number: 7845839

## 2021-04-21 NOTE — Progress Notes (Signed)
Remote pacemaker transmission.   

## 2021-05-11 ENCOUNTER — Telehealth: Payer: Self-pay

## 2021-05-11 NOTE — Telephone Encounter (Signed)
Alert remote reviewed. Normal device function.   Persistent AF, on OAC, good ventricular rate control, sent to triage due to persistent AF Next remote 07/13/2021.  Pt medications include Eliquis 5mg  BID, Multaq 400mg  BID, Metoprolol 25mg  Daily.    Pt has known history of PAF, LOV 09/22/20 with GT.    Attempted to reach patient to assess symptoms and determine if still in AF.  No answer, LVM with device clinic # and hours to return call.

## 2021-05-24 DIAGNOSIS — R059 Cough, unspecified: Secondary | ICD-10-CM | POA: Diagnosis not present

## 2021-05-26 NOTE — Telephone Encounter (Signed)
Low overall burden of AF, monitor last updated on 05/23/21- no further alerts for AF.

## 2021-07-13 ENCOUNTER — Ambulatory Visit (INDEPENDENT_AMBULATORY_CARE_PROVIDER_SITE_OTHER): Payer: Medicare HMO

## 2021-07-13 DIAGNOSIS — I495 Sick sinus syndrome: Secondary | ICD-10-CM | POA: Diagnosis not present

## 2021-07-13 LAB — CUP PACEART REMOTE DEVICE CHECK
Battery Remaining Longevity: 48 mo
Battery Remaining Percentage: 46 %
Battery Voltage: 2.95 V
Brady Statistic AP VP Percent: 9.1 %
Brady Statistic AP VS Percent: 1 %
Brady Statistic AS VP Percent: 91 %
Brady Statistic AS VS Percent: 1 %
Brady Statistic RA Percent Paced: 8.8 %
Brady Statistic RV Percent Paced: 99 %
Date Time Interrogation Session: 20230105020039
Implantable Lead Implant Date: 20161230
Implantable Lead Implant Date: 20161230
Implantable Lead Location: 753859
Implantable Lead Location: 753860
Implantable Pulse Generator Implant Date: 20161230
Lead Channel Impedance Value: 410 Ohm
Lead Channel Impedance Value: 600 Ohm
Lead Channel Pacing Threshold Amplitude: 0.5 V
Lead Channel Pacing Threshold Amplitude: 1.5 V
Lead Channel Pacing Threshold Pulse Width: 0.4 ms
Lead Channel Pacing Threshold Pulse Width: 1 ms
Lead Channel Sensing Intrinsic Amplitude: 12 mV
Lead Channel Sensing Intrinsic Amplitude: 2.5 mV
Lead Channel Setting Pacing Amplitude: 1.75 V
Lead Channel Setting Pacing Amplitude: 2 V
Lead Channel Setting Pacing Pulse Width: 1 ms
Lead Channel Setting Sensing Sensitivity: 2 mV
Pulse Gen Model: 2240
Pulse Gen Serial Number: 7845839

## 2021-07-24 NOTE — Progress Notes (Signed)
Remote pacemaker transmission.   

## 2021-08-24 ENCOUNTER — Other Ambulatory Visit: Payer: Self-pay | Admitting: Cardiovascular Disease

## 2021-09-11 ENCOUNTER — Other Ambulatory Visit: Payer: Self-pay | Admitting: Cardiovascular Disease

## 2021-09-11 DIAGNOSIS — I480 Paroxysmal atrial fibrillation: Secondary | ICD-10-CM

## 2021-09-11 NOTE — Telephone Encounter (Addendum)
Prescription refill request for Eliquis received. ?Indication: afib  ?Last office visit: 09/22/2020 ?Scr: 1.02, 02/03/2020 ?Age: 88 yo  ?Weight: 82.6 kg  ? ?Pt is to see Dr Eden Emms and Dr. Ladona Ridgel on 3/24. Scheduled lab appointment for that day.  ? ?Will send in refill do she does not run out.  ?

## 2021-09-18 NOTE — Progress Notes (Incomplete)
?  ?Date:  09/18/2021  ? ?ID:  Dana Morrison, DOB 05-10-1924, MRN [id number] ? ?PCP:  Soundra Pilon, FNP  ?Cardiologist:   Charlton Haws, MD  ?EP:  Ladona Ridgel  ? ?  ?History of Present Illness: ? ?88 y.o. from [place]. CAD most recent stent January 2016, PAF , PPM and HLD She has no chest pain indicates MI in 2010 with stents and repeat in 2016 Chronic dyspnea. Pacer placed 2015 has seen dr Ladona Ridgel for pacer check  She still drives and lives independently ? ?TTE 09/20/17  EF 55-60%  Estimated PA 35 mmHg no significant valve disease  ? ?Has had PAF Rx by Dr Ladona Ridgel. On low dose eliquis, lopressor and multaq.   St Jude device no escape dependant with no PAF  ? ?Her son is an Engineer, drilling at [employer]. Two grand daughters here. ? ?Living at [place] Has been vaccinated  Has had some ? Diastolic heart failure and uses bumex  ? ?*** ?Past Medical History:  ?Diagnosis Date  ? Atrial fibrillation (HCC)   ? Chronic diastolic heart failure (HCC) 09/12/2017  ? Congestive heart failure (CHF) (HCC)   ? estimated ejection fraction is 54%; diastolic  ? Coronary artery disease   ? Decreased vision 09/12/2017  ? Hx of myocardial infarction 2010  ? Hypertension   ? Osteoarthritis of hip 09/12/2017  ? Osteoarthritis of knee 09/12/2017  ? Paroxysmal atrial fibrillation (HCC) 09/12/2017  ? Scalp psoriasis 09/12/2017  ? Seasonal allergic rhinitis due to pollen 09/12/2017  ? Urticaria 09/12/2017  ? Venous insufficiency 09/12/2017  ? ? ?Past Surgical History:  ?Procedure Laterality Date  ? APPENDECTOMY    ? ARM FRACTURE Left   ? FRACTURE SURGERY Bilateral   ? WRISTS  ? LAPAROSCOPIC LYSIS OF ADHESIONS    ? PACEMAKER INSERTION    ? PLACEMENT OF STENTS    ? X5  ? REPLACEMENT TOTAL KNEE    ? REVISION TOTAL HIP ARTHROPLASTY Left   ? ? ? ?Current Outpatient Medications  ?Medication Sig Dispense Refill  ? apixaban (ELIQUIS) 5 MG TABS tablet TAKE 1 TABLET TWICE DAILY 180 tablet 0  ? aspirin 81 MG chewable tablet Chew 1 tablet by mouth daily.    ? bumetanide (BUMEX) 1 MG tablet  Take 1 tablet (1 mg total) by mouth daily. 90 tablet 3  ? cholestyramine (QUESTRAN) 4 g packet 1 packet mixed with water or non-carbonated drink    ? dronedarone (MULTAQ) 400 MG tablet Take 1 tablet (400 mg total) by mouth 2 (two) times daily with a meal. 180 tablet 1  ? isosorbide mononitrate (IMDUR) 30 MG 24 hr tablet TAKE 1 TABLET EVERY DAY 90 tablet 0  ? metoprolol succinate (TOPROL-XL) 25 MG 24 hr tablet TAKE 1 TABLET EVERY DAY 90 tablet 0  ? Multiple Vitamin (MULTIVITAMIN) capsule Take 1 capsule by mouth daily.    ? nitroGLYCERIN (NITROSTAT) 0.4 MG SL tablet Place 0.4 mg under the tongue every 5 (five) minutes as needed for chest pain.    ? pravastatin (PRAVACHOL) 20 MG tablet TAKE 1 TABLET EVERY DAY (NEED MD APPOINTMENT) 30 tablet 11  ? ?No current facility-administered medications for this visit.  ? ? ?Allergies:   Psyllium, Gluten meal, and Other  ? ? ?Social History:  The patient  reports that she has quit smoking. She has never used smokeless tobacco. She reports current alcohol use. She reports that she does not use drugs.  ? ?Family History:  The patient's family history includes Healthy  in her daughter, daughter, daughter, and son; Heart attack in her father and mother; Lung cancer in her brother; Microcephaly in her mother; Other in her brother.  ? ? ?ROS:  Please see the history of present illness.   Otherwise, review of systems are positive for none.   All other systems are reviewed and negative.  ? ? ?PHYSICAL EXAM: ?VS:  There were no vitals taken for this visit. , BMI There is no height or weight on file to calculate BMI. ? ?Elderly female ?Lungs clear ?PPM under left clavicle  ?1/6 SEM ?Abdomen benign ?Trace LE edema ?Palpable pedal pulses  ? ? ? ?EKG:   09/22/20 P synch pacing rate 76  ? ? ?Recent Labs: ?No results found for requested labs within last 8760 hours.  ? ? ?Lipid Panel ?No results found for: CHOL, TRIG, HDL, CHOLHDL, VLDL, LDLCALC, LDLDIRECT ?  ? ?Wt Readings from Last 3 Encounters:   ?09/22/20 182 lb (82.6 kg)  ?08/31/20 183 lb (83 kg)  ?10/19/19 185 lb 12.8 oz (84.3 kg)  ?  ? ? ?Other studies Reviewed: ?Additional studies/ records that were reviewed today include: Notes from primary labs and will review notes from [place] . ? ? ? ?ASSESSMENT AND PLAN: ? ?1.  CAD no angina continue ASA and lopressor Plavix d/c  ?2. PAF non recurrent  On low dose eliquis lopressor and multaq continue  ?3. HLD on statin labs with primary  ?4. PPM:  St Jude - reprogrammed 10/2019 needs f/u GT Dependant  ?5. Dyspnea:  No cardiac EF 55-60% TTE 09/20/17  ?6. Edema:  Right worse than left Bumex daily with K ? ?Current medicines are reviewed at length with the patient today.  The patient does not have concerns regarding medicines. ? ?The following changes have been made:  no change ? ? ?No orders of the defined types were placed in this encounter. ? ?Update echo for dyspnea , PPM and PAF  ? ?Disposition:   FU with EP Dr Ladona Ridgel and me in a year  ? ? ? ?Signed, ?Charlton Haws, MD  ?09/18/2021 3:51 PM    ?[facility] ?[address] [place], [place], [place]  [address] ?Phone: [phone]; Fax: [phone]  ?

## 2021-09-19 ENCOUNTER — Other Ambulatory Visit: Payer: Self-pay | Admitting: Cardiovascular Disease

## 2021-09-29 ENCOUNTER — Encounter: Payer: Medicare HMO | Admitting: Internal Medicine

## 2021-09-29 ENCOUNTER — Other Ambulatory Visit: Payer: Medicare HMO

## 2021-09-29 ENCOUNTER — Ambulatory Visit: Payer: Medicare HMO | Admitting: Cardiovascular Disease

## 2021-10-12 ENCOUNTER — Ambulatory Visit (INDEPENDENT_AMBULATORY_CARE_PROVIDER_SITE_OTHER): Payer: Medicare HMO

## 2021-10-12 DIAGNOSIS — I495 Sick sinus syndrome: Secondary | ICD-10-CM

## 2021-10-12 LAB — CUP PACEART REMOTE DEVICE CHECK
Battery Remaining Longevity: 48 mo
Battery Remaining Percentage: 43 %
Battery Voltage: 2.95 V
Brady Statistic AP VP Percent: 18 %
Brady Statistic AP VS Percent: 1 %
Brady Statistic AS VP Percent: 82 %
Brady Statistic AS VS Percent: 1 %
Brady Statistic RA Percent Paced: 17 %
Brady Statistic RV Percent Paced: 99 %
Date Time Interrogation Session: 20230406020013
Implantable Lead Implant Date: 20161230
Implantable Lead Implant Date: 20161230
Implantable Lead Location: 753859
Implantable Lead Location: 753860
Implantable Pulse Generator Implant Date: 20161230
Lead Channel Impedance Value: 430 Ohm
Lead Channel Impedance Value: 640 Ohm
Lead Channel Pacing Threshold Amplitude: 0.5 V
Lead Channel Pacing Threshold Amplitude: 1.125 V
Lead Channel Pacing Threshold Pulse Width: 0.4 ms
Lead Channel Pacing Threshold Pulse Width: 1 ms
Lead Channel Sensing Intrinsic Amplitude: 12 mV
Lead Channel Sensing Intrinsic Amplitude: 2.8 mV
Lead Channel Setting Pacing Amplitude: 1.375
Lead Channel Setting Pacing Amplitude: 2 V
Lead Channel Setting Pacing Pulse Width: 1 ms
Lead Channel Setting Sensing Sensitivity: 2 mV
Pulse Gen Model: 2240
Pulse Gen Serial Number: 7845839

## 2021-10-27 NOTE — Progress Notes (Signed)
Remote pacemaker transmission.   

## 2021-11-23 ENCOUNTER — Other Ambulatory Visit: Payer: Self-pay | Admitting: Cardiovascular Disease

## 2021-11-23 MED ORDER — MULTAQ 400 MG PO TABS: 400.0000 mg | ORAL_TABLET | Freq: Two times a day (BID) | ORAL | 0 refills | Status: DC

## 2021-12-07 ENCOUNTER — Other Ambulatory Visit: Payer: Self-pay | Admitting: Internal Medicine

## 2021-12-08 NOTE — Progress Notes (Signed)
Date:  12/15/2021   ID:  Dana Morrison, DOB April 04, 1924, MRN [id number]  PCP:  Soundra Pilon, FNP  Cardiologist:   Charlton Haws, MD  EP:  Ladona Ridgel     History of Present Illness:  88 y.o. from [place]. CAD most recent stent January 2016, PAF , PPM and HLD She has no chest pain indicates MI in 2010 with stents and repeat in 2016 Chronic dyspnea. Pacer placed 2015 has seen dr Ladona Ridgel for pacer check  She still drives and lives independently  TTE 09/20/17  EF 55-60%  Estimated PA 35 mmHg no significant valve disease   Has had PAF Rx by Dr Ladona Ridgel. On low dose eliquis, lopressor and multaq.   Had some high RV capture readings and PPM reprogrammed 10/19/19 PAF burden At that time <1%   CXR done then NAD PACEART 10/12/21 no PAF battery/leads ok  Her son is an Psychologist, educational professor at [employer]. Two grand daughters here age 72/16  Living at [place] Has been vaccinated No angina, Has PPM f/u and f/u with her primary for lab work coming up   Has dyspnea related to lungs Had tried oxygen before without benefit  Past Medical History:  Diagnosis Date   Atrial fibrillation (HCC)    Chronic diastolic heart failure (HCC) 09/12/2017   Congestive heart failure (CHF) (HCC)    estimated ejection fraction is 54%; diastolic   Coronary artery disease    Decreased vision 09/12/2017   Hx of myocardial infarction 2010   Hypertension    Osteoarthritis of hip 09/12/2017   Osteoarthritis of knee 09/12/2017   Paroxysmal atrial fibrillation (HCC) 09/12/2017   Scalp psoriasis 09/12/2017   Seasonal allergic rhinitis due to pollen 09/12/2017   Urticaria 09/12/2017   Venous insufficiency 09/12/2017    Past Surgical History:  Procedure Laterality Date   APPENDECTOMY     ARM FRACTURE Left    FRACTURE SURGERY Bilateral    WRISTS   LAPAROSCOPIC LYSIS OF ADHESIONS     PACEMAKER INSERTION     PLACEMENT OF STENTS     X5   REPLACEMENT TOTAL KNEE     REVISION TOTAL HIP ARTHROPLASTY Left      Current Outpatient Medications  Medication  Sig Dispense Refill   apixaban (ELIQUIS) 5 MG TABS tablet TAKE 1 TABLET TWICE DAILY 180 tablet 0   aspirin 81 MG chewable tablet Chew 1 tablet by mouth daily.     bumetanide (BUMEX) 1 MG tablet Take 1 tablet (1 mg total) by mouth daily. Please keep upcoming appointment for future refills. Thank you. 30 tablet 0   dronedarone (MULTAQ) 400 MG tablet Take 1 tablet (400 mg total) by mouth 2 (two) times daily with a meal. 180 tablet 0   isosorbide mononitrate (IMDUR) 30 MG 24 hr tablet TAKE 1 TABLET EVERY DAY 90 tablet 0   metoprolol succinate (TOPROL-XL) 25 MG 24 hr tablet TAKE 1 TABLET EVERY DAY 90 tablet 0   Multiple Vitamin (MULTIVITAMIN) capsule Take 1 capsule by mouth daily.     nitroGLYCERIN (NITROSTAT) 0.4 MG SL tablet Place 0.4 mg under the tongue every 5 (five) minutes as needed for chest pain.     pravastatin (PRAVACHOL) 20 MG tablet TAKE 1 TABLET EVERY DAY (NEED MD APPOINTMENT) 90 tablet 0   cholestyramine (QUESTRAN) 4 g packet 1 packet mixed with water or non-carbonated drink (Patient not taking: Reported on 12/15/2021)     No current facility-administered medications for this visit.    Allergies:  Psyllium, Gluten meal, and Other    Social History:  The patient  reports that she has quit smoking. She has never used smokeless tobacco. She reports current alcohol use. She reports that she does not use drugs.   Family History:  The patient's family history includes Healthy in her daughter, daughter, daughter, and son; Heart attack in her father and mother; Lung cancer in her brother; Microcephaly in her mother; Other in her brother.    ROS:  Please see the history of present illness.   Otherwise, review of systems are positive for none.   All other systems are reviewed and negative.    PHYSICAL EXAM: VS:  BP 100/60   Pulse 69   Ht 5\' 7"  (1.702 m)   Wt 184 lb (83.5 kg)   SpO2 96%   BMI 28.82 kg/m  , BMI Body mass index is 28.82 kg/m.  BP 132/76 mmHg pulse 80 Wearing  glasses No distress No tachypnea No JVP elevation  Son is with her  Exp wheezed RUL No murmur  No edema PPM under left clavicle     EKG:   12/15/2021 A sensed V pacing rate 69   Recent Labs: No results found for requested labs within last 365 days.    Lipid Panel No results found for: "CHOL", "TRIG", "HDL", "CHOLHDL", "VLDL", "LDLCALC", "LDLDIRECT"    Wt Readings from Last 3 Encounters:  12/15/21 184 lb (83.5 kg)  09/22/20 182 lb (82.6 kg)  08/31/20 183 lb (83 kg)      Other studies Reviewed: Additional studies/ records that were reviewed today include: Notes from primary labs and will review notes from 09/02/20 .    ASSESSMENT AND PLAN:  1.  CAD no angina continue ASA and lopressor Plavix d/c  2. PAF <1% asymptomatic  On eliquis  lopressor and multaq continue  Check BMET/CBC for dosing Cr has been normal and weight 182 lbs  3. HLD on statin labs with primary  4. PPM:  St Jude - reprogrammed 10/2019 needs f/u GT  5. Dyspnea:  No cardiac EF 55-60% TTE 09/20/17 f/u primary consider inhalers with wheezing on exam ? Post COVID residual lung dx 6. Edema:  Right worse than left Bumex daily with K  Current medicines are reviewed at length with the patient today.  The patient does not have concerns regarding medicines.  The following changes have been made:  no change   No orders of the defined types were placed in this encounter.    Disposition:   FU with general cards in a year      Signed, 09/22/17, MD  12/15/2021 9:02 AM    [facility] [address] [place], [place], [place]  [place] Phone: [phone]; Fax: [phone]

## 2021-12-15 ENCOUNTER — Ambulatory Visit: Payer: Medicare HMO | Admitting: Cardiovascular Disease

## 2021-12-15 ENCOUNTER — Encounter: Payer: Self-pay | Admitting: Cardiovascular Disease

## 2021-12-15 VITALS — BP 100/60 | HR 69 | Ht 67.0 in | Wt 184.0 lb

## 2021-12-15 DIAGNOSIS — I495 Sick sinus syndrome: Secondary | ICD-10-CM | POA: Diagnosis not present

## 2021-12-15 DIAGNOSIS — I480 Paroxysmal atrial fibrillation: Secondary | ICD-10-CM | POA: Diagnosis not present

## 2021-12-15 DIAGNOSIS — I509 Heart failure, unspecified: Secondary | ICD-10-CM | POA: Diagnosis not present

## 2021-12-15 DIAGNOSIS — Z950 Presence of cardiac pacemaker: Secondary | ICD-10-CM | POA: Diagnosis not present

## 2021-12-15 DIAGNOSIS — I2510 Atherosclerotic heart disease of native coronary artery without angina pectoris: Secondary | ICD-10-CM

## 2021-12-15 NOTE — Patient Instructions (Signed)
Medication Instructions:  Your physician recommends that you continue on your current medications as directed. Please refer to the Current Medication list given to you today.  *If you need a refill on your cardiac medications before your next appointment, please call your pharmacy*  Lab Work: If you have labs (blood work) drawn today and your tests are completely normal, you will receive your results only by: MyChart Message (if you have MyChart) OR A paper copy in the mail If you have any lab test that is abnormal or we need to change your treatment, we will call you to review the results.  Testing/Procedures: None ordered today.  Follow-Up: At [facility], you and your health needs are our priority.  As part of our continuing mission to provide you with exceptional heart care, we have created designated Provider Care Teams.  These Care Teams include your primary Cardiologist (physician) and Advanced Practice Providers (APPs -  Physician Assistants and Nurse Practitioners) who all work together to provide you with the care you need, when you need it.  We recommend signing up for the patient portal called "MyChart".  Sign up information is provided on this After Visit Summary.  MyChart is used to connect with patients for Virtual Visits (Telemedicine).  Patients are able to view lab/test results, encounter notes, upcoming appointments, etc.  Non-urgent messages can be sent to your provider as well.   To learn more about what you can do with MyChart, go to [email or web address].    Your next appointment:   12 month(s)  The format for your next appointment:   In Person  Provider:   Peter Nishan, MD {   Important Information About Sugar       

## 2022-01-11 ENCOUNTER — Ambulatory Visit (INDEPENDENT_AMBULATORY_CARE_PROVIDER_SITE_OTHER): Payer: Medicare HMO

## 2022-01-11 DIAGNOSIS — I495 Sick sinus syndrome: Secondary | ICD-10-CM

## 2022-01-11 LAB — CUP PACEART REMOTE DEVICE CHECK
Battery Remaining Longevity: 46 mo
Battery Remaining Percentage: 40 %
Battery Voltage: 2.93 V
Brady Statistic AP VP Percent: 17 %
Brady Statistic AP VS Percent: 1 %
Brady Statistic AS VP Percent: 82 %
Brady Statistic AS VS Percent: 1 %
Brady Statistic RA Percent Paced: 17 %
Brady Statistic RV Percent Paced: 99 %
Date Time Interrogation Session: 20230706020013
Implantable Lead Implant Date: 20161230
Implantable Lead Implant Date: 20161230
Implantable Lead Location: 753859
Implantable Lead Location: 753860
Implantable Pulse Generator Implant Date: 20161230
Lead Channel Impedance Value: 450 Ohm
Lead Channel Impedance Value: 600 Ohm
Lead Channel Pacing Threshold Amplitude: 0.5 V
Lead Channel Pacing Threshold Amplitude: 1 V
Lead Channel Pacing Threshold Pulse Width: 0.4 ms
Lead Channel Pacing Threshold Pulse Width: 1 ms
Lead Channel Sensing Intrinsic Amplitude: 12 mV
Lead Channel Sensing Intrinsic Amplitude: 2.4 mV
Lead Channel Setting Pacing Amplitude: 1.25 V
Lead Channel Setting Pacing Amplitude: 2 V
Lead Channel Setting Pacing Pulse Width: 1 ms
Lead Channel Setting Sensing Sensitivity: 2 mV
Pulse Gen Model: 2240
Pulse Gen Serial Number: 7845839

## 2022-01-29 NOTE — Progress Notes (Signed)
Remote pacemaker transmission.   

## 2022-02-15 IMAGING — DX DG CHEST 2V
2 series · 2 of 2 positions shown · non-contrast
Comparison: None

CLINICAL DATA: Pacemaker malfunction. Chronic diastolic heart
failure

EXAM:
CHEST - 2 VIEW

[dg chest 2 view (1 of 2)]
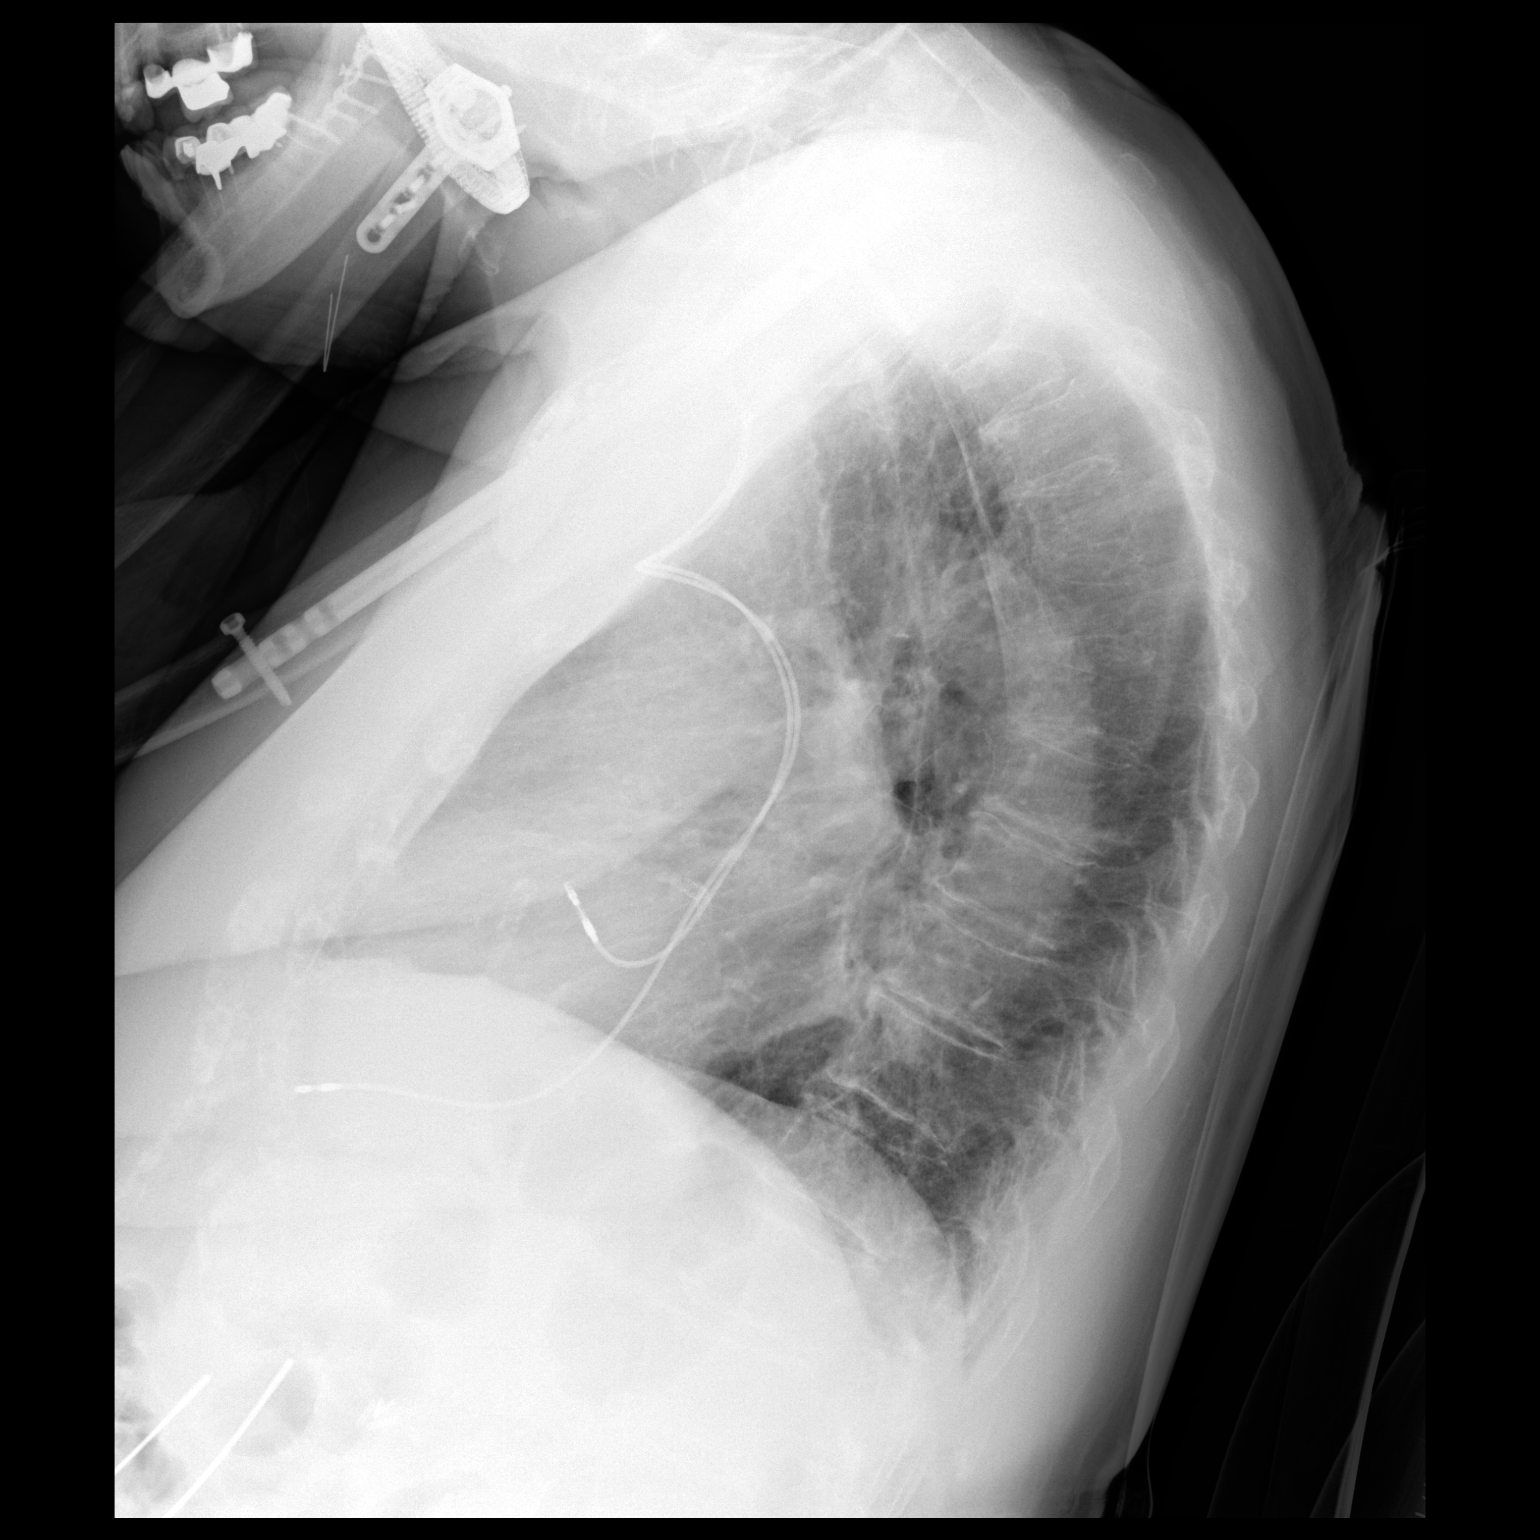

[dg chest 2 view (2 of 2)]
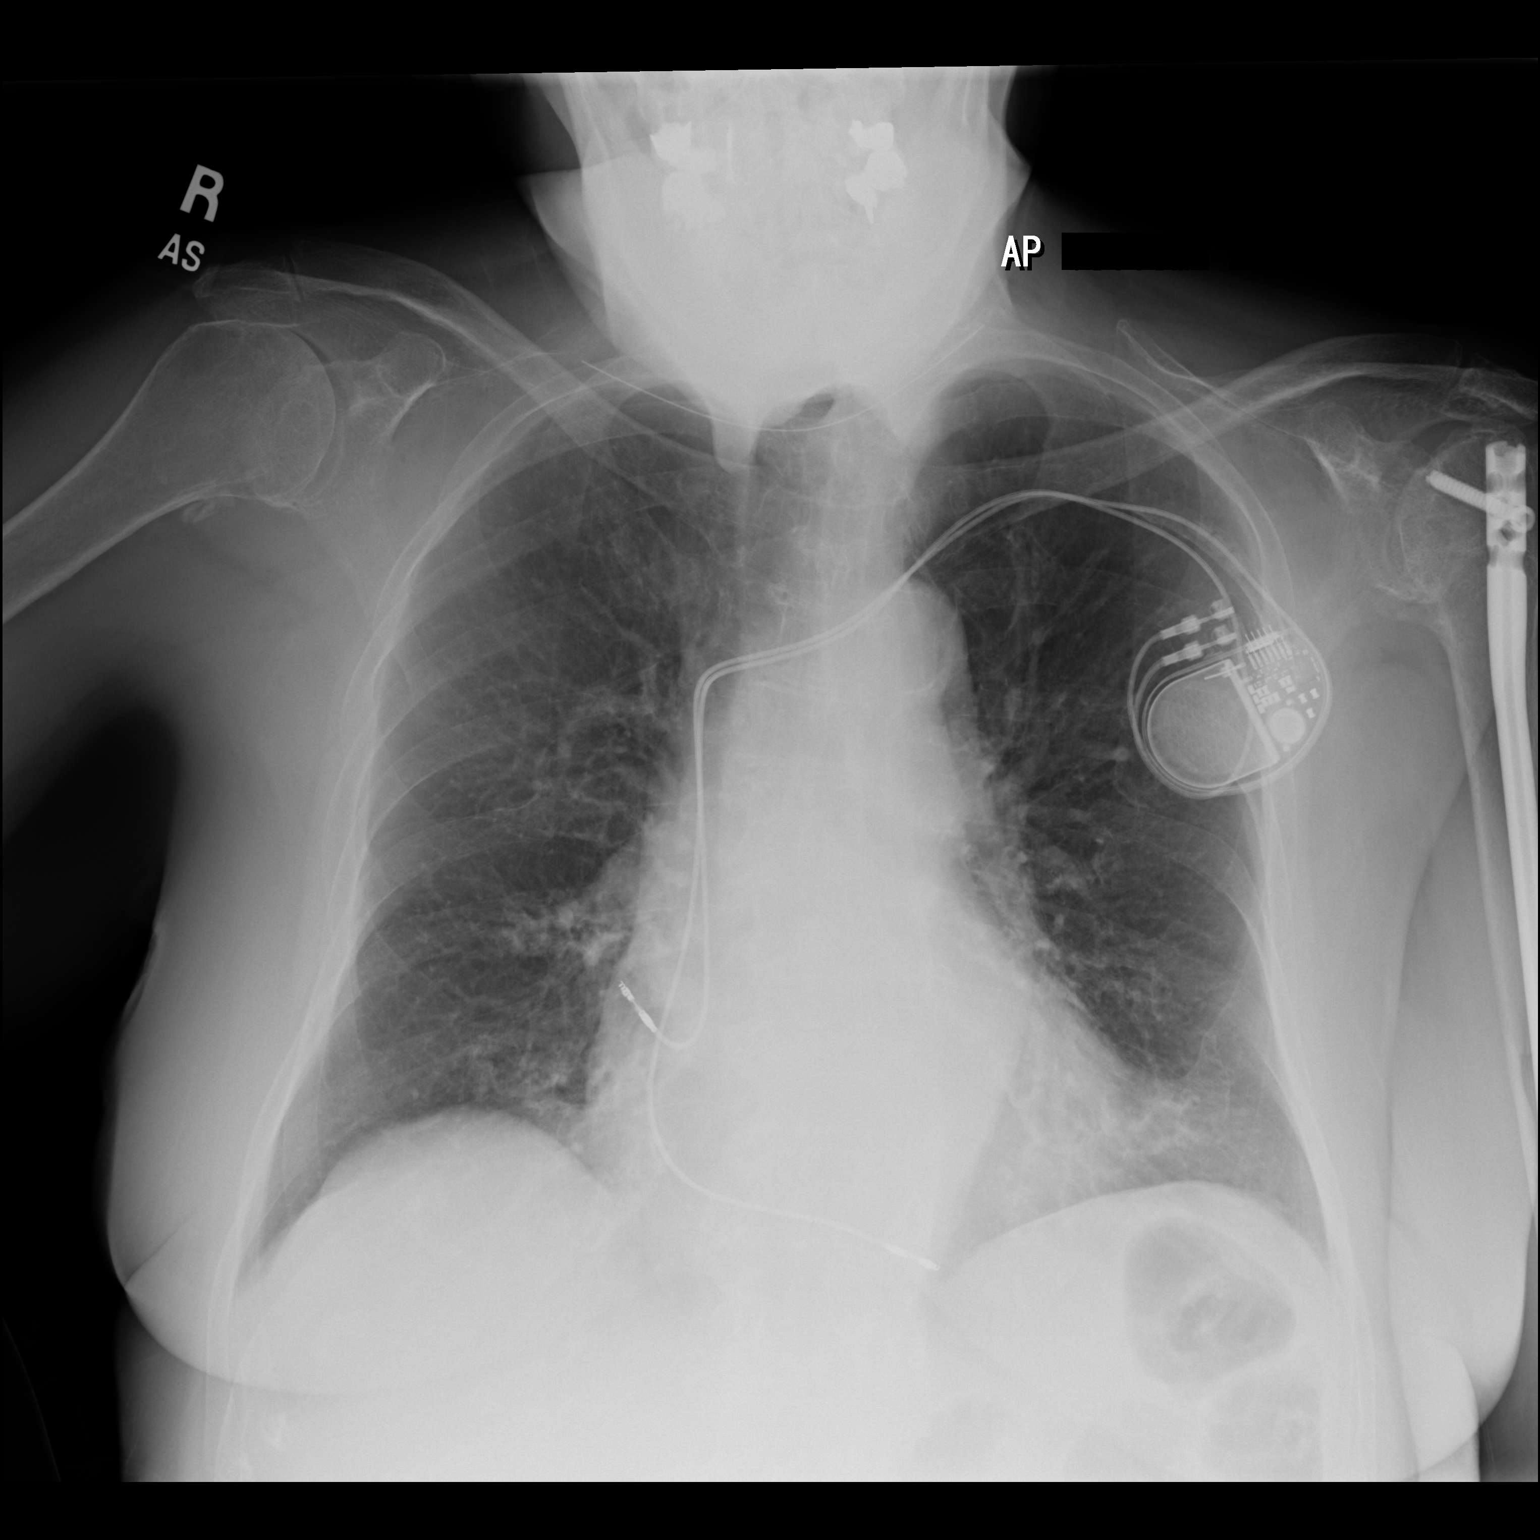

[2 of 2 positions shown; findings below may reference images not displayed]

FINDINGS: There is a left chest wall pacer device with leads in the right
atrial appendage and right ventricle. The leads appear intact. Heart
size is normal. No pleural effusion or edema. No airspace densities
identified. Previous ORIF of the left humerus.
IMPRESSION: No active cardiopulmonary abnormalities.

## 2022-02-22 ENCOUNTER — Other Ambulatory Visit: Payer: Self-pay | Admitting: Cardiovascular Disease

## 2022-03-15 DIAGNOSIS — H52223 Regular astigmatism, bilateral: Secondary | ICD-10-CM | POA: Diagnosis not present

## 2022-03-19 ENCOUNTER — Other Ambulatory Visit: Payer: Self-pay | Admitting: Internal Medicine

## 2022-03-21 ENCOUNTER — Other Ambulatory Visit: Payer: Self-pay | Admitting: Cardiovascular Disease

## 2022-03-30 DIAGNOSIS — M25551 Pain in right hip: Secondary | ICD-10-CM | POA: Diagnosis not present

## 2022-03-30 DIAGNOSIS — R053 Chronic cough: Secondary | ICD-10-CM | POA: Diagnosis not present

## 2022-03-30 DIAGNOSIS — I509 Heart failure, unspecified: Secondary | ICD-10-CM | POA: Diagnosis not present

## 2022-04-12 ENCOUNTER — Ambulatory Visit (INDEPENDENT_AMBULATORY_CARE_PROVIDER_SITE_OTHER): Payer: Medicare HMO

## 2022-04-12 DIAGNOSIS — I495 Sick sinus syndrome: Secondary | ICD-10-CM | POA: Diagnosis not present

## 2022-04-12 LAB — CUP PACEART REMOTE DEVICE CHECK
Battery Remaining Longevity: 41 mo
Battery Remaining Percentage: 37 %
Battery Voltage: 2.92 V
Brady Statistic AP VP Percent: 14 %
Brady Statistic AP VS Percent: 1 %
Brady Statistic AS VP Percent: 86 %
Brady Statistic AS VS Percent: 1 %
Brady Statistic RA Percent Paced: 13 %
Brady Statistic RV Percent Paced: 99 %
Date Time Interrogation Session: 20231005020014
Implantable Lead Implant Date: 20161230
Implantable Lead Implant Date: 20161230
Implantable Lead Location: 753859
Implantable Lead Location: 753860
Implantable Pulse Generator Implant Date: 20161230
Lead Channel Impedance Value: 440 Ohm
Lead Channel Impedance Value: 600 Ohm
Lead Channel Pacing Threshold Amplitude: 0.5 V
Lead Channel Pacing Threshold Amplitude: 0.75 V
Lead Channel Pacing Threshold Pulse Width: 0.4 ms
Lead Channel Pacing Threshold Pulse Width: 1 ms
Lead Channel Sensing Intrinsic Amplitude: 12 mV
Lead Channel Sensing Intrinsic Amplitude: 2.4 mV
Lead Channel Setting Pacing Amplitude: 1 V
Lead Channel Setting Pacing Amplitude: 2 V
Lead Channel Setting Pacing Pulse Width: 1 ms
Lead Channel Setting Sensing Sensitivity: 2 mV
Pulse Gen Model: 2240
Pulse Gen Serial Number: 7845839

## 2022-04-13 ENCOUNTER — Other Ambulatory Visit: Payer: Self-pay | Admitting: Internal Medicine

## 2022-04-19 NOTE — Progress Notes (Signed)
Remote pacemaker transmission.   

## 2022-05-01 ENCOUNTER — Other Ambulatory Visit: Payer: Self-pay | Admitting: Cardiovascular Disease

## 2022-05-01 DIAGNOSIS — I495 Sick sinus syndrome: Secondary | ICD-10-CM | POA: Diagnosis not present

## 2022-05-01 DIAGNOSIS — R0602 Shortness of breath: Secondary | ICD-10-CM | POA: Diagnosis not present

## 2022-05-01 DIAGNOSIS — Z23 Encounter for immunization: Secondary | ICD-10-CM | POA: Diagnosis not present

## 2022-05-01 DIAGNOSIS — D6859 Other primary thrombophilia: Secondary | ICD-10-CM | POA: Diagnosis not present

## 2022-05-01 DIAGNOSIS — I25118 Atherosclerotic heart disease of native coronary artery with other forms of angina pectoris: Secondary | ICD-10-CM | POA: Diagnosis not present

## 2022-05-01 DIAGNOSIS — I480 Paroxysmal atrial fibrillation: Secondary | ICD-10-CM | POA: Diagnosis not present

## 2022-05-01 DIAGNOSIS — I442 Atrioventricular block, complete: Secondary | ICD-10-CM | POA: Diagnosis not present

## 2022-05-01 DIAGNOSIS — R053 Chronic cough: Secondary | ICD-10-CM | POA: Diagnosis not present

## 2022-05-01 DIAGNOSIS — K529 Noninfective gastroenteritis and colitis, unspecified: Secondary | ICD-10-CM | POA: Diagnosis not present

## 2022-05-07 ENCOUNTER — Other Ambulatory Visit: Payer: Self-pay | Admitting: Internal Medicine

## 2022-05-07 ENCOUNTER — Other Ambulatory Visit (HOSPITAL_COMMUNITY): Payer: Self-pay | Admitting: Internal Medicine

## 2022-05-07 DIAGNOSIS — R053 Chronic cough: Secondary | ICD-10-CM

## 2022-05-08 ENCOUNTER — Other Ambulatory Visit: Payer: Self-pay | Admitting: Cardiovascular Disease

## 2022-05-09 NOTE — Telephone Encounter (Addendum)
Prescription refill request for Eliquis received. Indication: PAF Last office visit: 12/15/21  Edmonia James MD Scr:  1.07 on 08/06/19 Age: 88 Weight: 83.5kg  Based on above findings Eliquis 5mg  twice daily is the appropriate dose.  Pt is past due for labs and appt with Dr Johnsie Cancel.  Has appt scheduled for 12/2022.  Sent message to schedulers to move up appt with Dr Johnsie Cancel if possible.  Refill approved x 1 only.

## 2022-05-10 ENCOUNTER — Other Ambulatory Visit (HOSPITAL_COMMUNITY): Payer: Medicare HMO

## 2022-05-14 ENCOUNTER — Other Ambulatory Visit: Payer: Self-pay | Admitting: *Deleted

## 2022-05-15 ENCOUNTER — Other Ambulatory Visit (HOSPITAL_COMMUNITY): Payer: Medicare HMO

## 2022-05-16 ENCOUNTER — Other Ambulatory Visit: Payer: Self-pay

## 2022-05-23 ENCOUNTER — Inpatient Hospital Stay (HOSPITAL_COMMUNITY): Admission: RE | Admit: 2022-05-23 | Payer: Medicare HMO | Source: Ambulatory Visit

## 2022-05-30 ENCOUNTER — Ambulatory Visit (HOSPITAL_COMMUNITY)
Admission: RE | Admit: 2022-05-30 | Discharge: 2022-05-30 | Disposition: A | Discharge status: Home / Self Care | Payer: Medicare HMO | Source: Ambulatory Visit | Attending: Internal Medicine | Admitting: Internal Medicine

## 2022-05-30 DIAGNOSIS — R053 Chronic cough: Secondary | ICD-10-CM | POA: Diagnosis not present

## 2022-05-30 DIAGNOSIS — K224 Dyskinesia of esophagus: Secondary | ICD-10-CM | POA: Diagnosis not present

## 2022-06-06 ENCOUNTER — Other Ambulatory Visit: Payer: Self-pay

## 2022-06-06 ENCOUNTER — Telehealth: Payer: Self-pay | Admitting: Cardiovascular Disease

## 2022-06-06 ENCOUNTER — Other Ambulatory Visit: Payer: Self-pay | Admitting: Internal Medicine

## 2022-06-06 DIAGNOSIS — K224 Dyskinesia of esophagus: Secondary | ICD-10-CM

## 2022-06-06 MED ORDER — BUMETANIDE 1 MG PO TABS: 1.0000 mg | ORAL_TABLET | Freq: Every day | ORAL | 0 refills | Status: DC

## 2022-06-06 NOTE — Telephone Encounter (Signed)
Pt c/o Shortness Of Breath: STAT if SOB developed within the last 24 hours or pt is noticeably SOB on the phone  1. Are you currently SOB (can you hear that pt is SOB on the phone)?  Daughter in law not currently with the patient  2. How long have you been experiencing SOB?  About 3 month months  3. Are you SOB when sitting or when up moving around?  SOB increases with movement  4. Are you currently experiencing any other symptoms?  Low energy, constant has mucus and clearing her throat, but new PCP, Dr. Thornell Mule, checked and lungs are clear

## 2022-06-06 NOTE — Telephone Encounter (Signed)
Left message for patient's family member to call back.

## 2022-06-11 ENCOUNTER — Telehealth: Payer: Self-pay | Admitting: Gastroenterology

## 2022-06-11 NOTE — Telephone Encounter (Signed)
Dr. Lavon Paganini   We received an urgent referral for this patient on 06/07/22 (supervising MD in the AM) for moderate to severe esophageal dymotility on barium swallow causing SOB (records in [facility]). Patient last saw [facility] in 2021 (records in [place]). Please advise urgency and scheduling?  Thanks

## 2022-06-12 ENCOUNTER — Other Ambulatory Visit: Payer: Self-pay | Admitting: Internal Medicine

## 2022-06-12 ENCOUNTER — Other Ambulatory Visit: Payer: Self-pay | Admitting: Cardiovascular Disease

## 2022-06-12 DIAGNOSIS — R053 Chronic cough: Secondary | ICD-10-CM

## 2022-06-12 MED ORDER — APIXABAN 5 MG PO TABS: 5.0000 mg | ORAL_TABLET | Freq: Two times a day (BID) | ORAL | 5 refills | Status: DC

## 2022-06-12 NOTE — Telephone Encounter (Signed)
Prescription refill request for Eliquis received. Indication:afib Last office visit:6/23 Scr:1.0 Age: 88 Weight:83.5  kg  Prescription refilled

## 2022-06-12 NOTE — Telephone Encounter (Signed)
Unfortunately we cannot accommodate the transfer request

## 2022-06-12 NOTE — Telephone Encounter (Signed)
LM on Dana Morrison's vmail at [facility] with Dr. Sharol Given recommendations

## 2022-06-14 DIAGNOSIS — E785 Hyperlipidemia, unspecified: Secondary | ICD-10-CM | POA: Diagnosis not present

## 2022-06-14 DIAGNOSIS — I442 Atrioventricular block, complete: Secondary | ICD-10-CM | POA: Diagnosis not present

## 2022-06-14 DIAGNOSIS — I25118 Atherosclerotic heart disease of native coronary artery with other forms of angina pectoris: Secondary | ICD-10-CM | POA: Diagnosis not present

## 2022-06-14 DIAGNOSIS — I2510 Atherosclerotic heart disease of native coronary artery without angina pectoris: Secondary | ICD-10-CM | POA: Diagnosis not present

## 2022-06-14 DIAGNOSIS — I5032 Chronic diastolic (congestive) heart failure: Secondary | ICD-10-CM | POA: Diagnosis not present

## 2022-06-14 DIAGNOSIS — I495 Sick sinus syndrome: Secondary | ICD-10-CM | POA: Diagnosis not present

## 2022-06-14 DIAGNOSIS — I110 Hypertensive heart disease with heart failure: Secondary | ICD-10-CM | POA: Diagnosis not present

## 2022-06-14 DIAGNOSIS — I480 Paroxysmal atrial fibrillation: Secondary | ICD-10-CM | POA: Diagnosis not present

## 2022-06-14 DIAGNOSIS — Z7982 Long term (current) use of aspirin: Secondary | ICD-10-CM | POA: Diagnosis not present

## 2022-06-22 DIAGNOSIS — K224 Dyskinesia of esophagus: Secondary | ICD-10-CM | POA: Diagnosis not present

## 2022-06-22 DIAGNOSIS — D6859 Other primary thrombophilia: Secondary | ICD-10-CM | POA: Diagnosis not present

## 2022-06-22 DIAGNOSIS — R053 Chronic cough: Secondary | ICD-10-CM | POA: Diagnosis not present

## 2022-06-22 DIAGNOSIS — K529 Noninfective gastroenteritis and colitis, unspecified: Secondary | ICD-10-CM | POA: Diagnosis not present

## 2022-06-22 DIAGNOSIS — R0602 Shortness of breath: Secondary | ICD-10-CM | POA: Diagnosis not present

## 2022-06-22 DIAGNOSIS — I495 Sick sinus syndrome: Secondary | ICD-10-CM | POA: Diagnosis not present

## 2022-06-22 DIAGNOSIS — I480 Paroxysmal atrial fibrillation: Secondary | ICD-10-CM | POA: Diagnosis not present

## 2022-06-22 DIAGNOSIS — I25118 Atherosclerotic heart disease of native coronary artery with other forms of angina pectoris: Secondary | ICD-10-CM | POA: Diagnosis not present

## 2022-06-22 DIAGNOSIS — I442 Atrioventricular block, complete: Secondary | ICD-10-CM | POA: Diagnosis not present

## 2022-06-28 ENCOUNTER — Ambulatory Visit (INDEPENDENT_AMBULATORY_CARE_PROVIDER_SITE_OTHER): Payer: Medicare HMO | Admitting: Pulmonary Disease

## 2022-06-28 ENCOUNTER — Encounter (HOSPITAL_BASED_OUTPATIENT_CLINIC_OR_DEPARTMENT_OTHER): Payer: Self-pay | Admitting: Pulmonary Disease

## 2022-06-28 VITALS — BP 112/60 | HR 68 | Ht 66.0 in | Wt 181.2 lb

## 2022-06-28 DIAGNOSIS — G4734 Idiopathic sleep related nonobstructive alveolar hypoventilation: Secondary | ICD-10-CM

## 2022-06-28 DIAGNOSIS — R0602 Shortness of breath: Secondary | ICD-10-CM | POA: Diagnosis not present

## 2022-06-28 DIAGNOSIS — J42 Unspecified chronic bronchitis: Secondary | ICD-10-CM

## 2022-06-28 NOTE — Progress Notes (Signed)
Subjective:   PATIENT ID: Dana Morrison GENDER: female DOB: 20-Apr-1924, MRN: [id number]  Chief Complaint  Patient presents with   Consult    Sob since heartache in 2010, had covid 2 years ago    Reason for Visit: New consult for shortness of breath  [name] is a 88 year old female with CAD s/p stent 2016, pAF, s/p PPM, HLD, esophageal dysmotility who presents for shortness of breath. Present with son who provides additional history.  She has a long standing history of shortness of breath for >10 years since her cardiac arrest in 2010. Followed by cardiology, on bumex for lower extremity edema. Referred for wheezing and pulmonary evaluation.  Two years ago she had covid but has had worsening respiratory symptoms in the last 6 months with shortness of breath, chest congestion and nonproductive cough. Worsens with walking, bathing or any upper body movement. Denies wheezing. Denies nocturnal symptoms. She had previously seen a Pulmonologist in [place] a few decades ago and reported needing oxygen at night which did not help.  Social History: Negligible smoking history. Quit in 1995  I have personally reviewed patient's past medical/family/social history, allergies, current medications.  Past Medical History:  Diagnosis Date   Atrial fibrillation (HCC)    Chronic diastolic heart failure (HCC) 09/12/2017   Congestive heart failure (CHF) (HCC)    estimated ejection fraction is 54%; diastolic   Coronary artery disease    Decreased vision 09/12/2017   Hx of myocardial infarction 2010   Hypertension    Osteoarthritis of hip 09/12/2017   Osteoarthritis of knee 09/12/2017   Paroxysmal atrial fibrillation (HCC) 09/12/2017   Scalp psoriasis 09/12/2017   Seasonal allergic rhinitis due to pollen 09/12/2017   Urticaria 09/12/2017   Venous insufficiency 09/12/2017     Family History  Problem Relation Age of Onset   Microcephaly Mother    Heart attack Mother    Heart attack Father    Lung  cancer Brother    Other Brother        BACK PROBLEMS   Healthy Daughter    Healthy Daughter    Healthy Daughter    Healthy Son      Social History   Occupational History   Not on file  Tobacco Use   Smoking status: Former    Packs/day: 0.25    Years: 15.00    Total pack years: 3.75    Types: Cigarettes    Quit date: 1997    Years since quitting: 26.9   Smokeless tobacco: Never   Tobacco comments:    Started smoking at 38 when husband passed stopped smoking age 89  Vaping Use   Vaping Use: Never used  Substance and Sexual Activity   Alcohol use: Yes    Comment: RARE   Drug use: No   Sexual activity: Not Currently    Allergies  Allergen Reactions   Psyllium Diarrhea   Gluten Meal     Per patient and her son, the need for a gluten free diet is due to some intolerances that she has had for many years. It is not an allergy.    Other     Per patient she has an intolerance to some legumes. She does not have a true allergy. Pt's son confirmed this as well.      Outpatient Medications Prior to Visit  Medication Sig Dispense Refill   apixaban (ELIQUIS) 5 MG TABS tablet Take 1 tablet (5 mg total) by mouth 2 (two)  times daily. 60 tablet 5   aspirin 81 MG chewable tablet Chew 1 tablet by mouth daily.     bumetanide (BUMEX) 1 MG tablet Take 1 tablet (1 mg total) by mouth daily. 30 tablet 0   isosorbide mononitrate (IMDUR) 30 MG 24 hr tablet TAKE 1 TABLET EVERY DAY 90 tablet 2   metoprolol succinate (TOPROL-XL) 25 MG 24 hr tablet TAKE 1 TABLET EVERY DAY 90 tablet 2   MULTAQ 400 MG tablet TAKE 1 TABLET TWICE DAILY WITH MEALS 180 tablet 3   Multiple Vitamin (MULTIVITAMIN) capsule Take 1 capsule by mouth daily.     pravastatin (PRAVACHOL) 20 MG tablet TAKE 1 TABLET EVERY DAY (NEED MD APPOINTMENT) 90 tablet 1   cholestyramine (QUESTRAN) 4 g packet 1 packet mixed with water or non-carbonated drink (Patient not taking: Reported on 12/15/2021)     nitroGLYCERIN (NITROSTAT) 0.4 MG SL  tablet Place 0.4 mg under the tongue every 5 (five) minutes as needed for chest pain. (Patient not taking: Reported on 06/28/2022)     No facility-administered medications prior to visit.    Review of Systems  Constitutional:  Negative for chills, diaphoresis, fever, malaise/fatigue and weight loss.  HENT:  Negative for congestion.   Respiratory:  Positive for cough and shortness of breath. Negative for hemoptysis, sputum production and wheezing.   Cardiovascular:  Positive for leg swelling. Negative for chest pain and palpitations.     Objective:   Vitals:   06/28/22 0910  BP: 112/60  Pulse: 68  SpO2: 96%  Weight: 181 lb 3.2 oz (82.2 kg)  Height: 5\' 6"  (1.676 m)   SpO2: 96 % O2 Device: None (Room air)  Physical Exam: General: Well-appearing, no acute distress, mild scoliosis HENT: [place], AT Eyes: EOMI, no scleral icterus Respiratory: Clear to auscultation bilaterally.  No crackles, wheezing or rales Cardiovascular: RRR, -M/R/G, no JVD Extremities:-Edema,-tenderness Neuro: AAO x4, CNII-XII grossly intact Psych: Normal mood, normal affect  Data Reviewed:  Imaging: CXR reportedly normal per PCP phone conversation  PFT: None on file  Labs: CBC    Component Value Date/Time   WBC 7.2 08/06/2019 1052   RBC 4.89 08/06/2019 1052   HGB 13.6 08/06/2019 1052   HCT 40.9 08/06/2019 1052   PLT 189 08/06/2019 1052   MCV 84 08/06/2019 1052   MCH 27.8 08/06/2019 1052   MCHC 33.3 08/06/2019 1052   RDW 14.4 08/06/2019 1052   LYMPHSABS 1.2 02/27/2018 1024   EOSABS 0.1 02/27/2018 1024   BASOSABS 0.0 02/27/2018 1024   Absolute eos 02/27/18 -100     Assessment & Plan:   Discussion: 88 year old female with CAD s/p stent 2016, pAF, s/p PPM, HLD, esophageal dysmotility who presents for shortness of breath. Reviewed history as noted above. I contacted her PCP, Dr. 2017 to discuss patient care and recent CXR imaging (normal). Discussed plan as noted below. Addressed questions and  concerns. Symptoms atypical for asthma so will hold on bronchodilators for now. Will wait on CT scan to rule out possible bronchiectasis. Will also confirm if nocturnal hypoxemia still present. Counseled on deconditioning and benefit of physical therapy. Patient currently receiving OT for left arm.  Shortness of breath Chronic cough History of nocturnal hypoxemia --ORDER pulmonary function tests --ORDER overnight oximetry on room air --Agree with CT Chest to evaluate for parenchyma abnormalities --START mucinex 600 mg twice a day for 7-10 days --ORDER flutter valve to use 3x a day  Health Maintenance Immunization History  Administered Date(s) Administered  Influenza, High Dose Seasonal PF 05/01/2022   PFIZER(Purple Top)SARS-COV-2 Vaccination 09/06/2019, 09/28/2019   CT Lung Screen - not qualified due to age, tobacco history  Orders Placed This Encounter  Procedures   Pulse oximetry, overnight    Standing Status:   Future    Standing Expiration Date:   06/29/2023    Scheduling Instructions:     Lindell Spar on room air   Flutter valve    Standing Status:   Future    Standing Expiration Date:   06/29/2023   Pulmonary function test    Standing Status:   Future    Standing Expiration Date:   06/29/2023    Order Specific Question:   Where should this test be performed?    Answer:   [facility]    Order Specific Question:   Full PFT: includes the following: basic spirometry, spirometry pre & post bronchodilator, diffusion capacity (DLCO), lung volumes    Answer:   Full PFT  No orders of the defined types were placed in this encounter.   Return in about 1 month (around 07/29/2022).  I have spent a total time of 45-minutes on the day of the appointment reviewing prior documentation, coordinating care and discussing medical diagnosis and plan with the patient/family. Imaging, labs and tests included in this note have been reviewed and interpreted independently by me.  Rashard Ryle Mechele Collin,  MD  Pulmonary Critical Care 06/28/2022 12:42 PM  Office Number [phone]

## 2022-06-28 NOTE — Patient Instructions (Addendum)
Shortness of breath Chronic cough History of nocturnal hypoxemia --ORDER pulmonary function tests --ORDER overnight oximetry on room air --Agree with CT Chest to evaluate for parenchyma abnormalities --START mucinex 600 mg twice a day for 7-10 days --ORDER flutter valve to use 3x a day  Follow-up with me in January with PFTs prior to visit Please schedule after 07/27/21 (after CT)

## 2022-07-03 ENCOUNTER — Other Ambulatory Visit: Payer: Self-pay | Admitting: Internal Medicine

## 2022-07-12 ENCOUNTER — Ambulatory Visit (INDEPENDENT_AMBULATORY_CARE_PROVIDER_SITE_OTHER): Payer: Medicare HMO

## 2022-07-12 DIAGNOSIS — I495 Sick sinus syndrome: Secondary | ICD-10-CM

## 2022-07-12 LAB — CUP PACEART REMOTE DEVICE CHECK
Battery Remaining Longevity: 37 mo
Battery Remaining Percentage: 34 %
Battery Voltage: 2.92 V
Brady Statistic AP VP Percent: 6.9 %
Brady Statistic AP VS Percent: 1 %
Brady Statistic AS VP Percent: 93 %
Brady Statistic AS VS Percent: 1 %
Brady Statistic RA Percent Paced: 6.7 %
Brady Statistic RV Percent Paced: 99 %
Date Time Interrogation Session: 20240104020019
Implantable Lead Connection Status: 753985
Implantable Lead Connection Status: 753985
Implantable Lead Implant Date: 20161230
Implantable Lead Implant Date: 20161230
Implantable Lead Location: 753859
Implantable Lead Location: 753860
Implantable Pulse Generator Implant Date: 20161230
Lead Channel Impedance Value: 460 Ohm
Lead Channel Impedance Value: 600 Ohm
Lead Channel Pacing Threshold Amplitude: 0.5 V
Lead Channel Pacing Threshold Amplitude: 0.875 V
Lead Channel Pacing Threshold Pulse Width: 0.4 ms
Lead Channel Pacing Threshold Pulse Width: 1 ms
Lead Channel Sensing Intrinsic Amplitude: 12 mV
Lead Channel Sensing Intrinsic Amplitude: 3.1 mV
Lead Channel Setting Pacing Amplitude: 1.125
Lead Channel Setting Pacing Amplitude: 2 V
Lead Channel Setting Pacing Pulse Width: 1 ms
Lead Channel Setting Sensing Sensitivity: 2 mV
Pulse Gen Model: 2240
Pulse Gen Serial Number: 7845839

## 2022-07-13 ENCOUNTER — Other Ambulatory Visit: Payer: Self-pay | Admitting: Internal Medicine

## 2022-07-16 ENCOUNTER — Other Ambulatory Visit: Payer: Self-pay

## 2022-07-16 MED ORDER — BUMETANIDE 1 MG PO TABS: 1.0000 mg | ORAL_TABLET | ORAL | 6 refills | Status: DC | PRN

## 2022-07-19 DIAGNOSIS — R0683 Snoring: Secondary | ICD-10-CM | POA: Diagnosis not present

## 2022-07-19 DIAGNOSIS — G4730 Sleep apnea, unspecified: Secondary | ICD-10-CM | POA: Diagnosis not present

## 2022-07-20 ENCOUNTER — Other Ambulatory Visit: Payer: Self-pay

## 2022-07-20 MED ORDER — BUMETANIDE 1 MG PO TABS: 1.0000 mg | ORAL_TABLET | Freq: Every day | ORAL | 1 refills | Status: DC

## 2022-07-26 ENCOUNTER — Telehealth: Payer: Self-pay

## 2022-07-26 MED ORDER — BUMETANIDE 1 MG PO TABS: 1.0000 mg | ORAL_TABLET | Freq: Every day | ORAL | 1 refills | Status: DC

## 2022-07-26 NOTE — Telephone Encounter (Signed)
The patient called and stated that [facility] said they never received a refill from the office. The patient was told that it was sent on 07/20/22. The patient said that the pharmacy called her this morning about it. The patient was told that the prescription would be resent.

## 2022-07-26 NOTE — Addendum Note (Signed)
Addended by: Michelle Nasuti on: 07/26/2022 09:13 AM   Modules accepted: Orders

## 2022-07-27 ENCOUNTER — Ambulatory Visit
Admission: RE | Admit: 2022-07-27 | Discharge: 2022-07-27 | Disposition: A | Discharge status: Home / Self Care | Payer: Medicare HMO | Source: Ambulatory Visit | Attending: Internal Medicine | Admitting: Internal Medicine

## 2022-07-27 DIAGNOSIS — J984 Other disorders of lung: Secondary | ICD-10-CM | POA: Diagnosis not present

## 2022-07-27 DIAGNOSIS — R918 Other nonspecific abnormal finding of lung field: Secondary | ICD-10-CM | POA: Diagnosis not present

## 2022-07-27 DIAGNOSIS — R053 Chronic cough: Secondary | ICD-10-CM

## 2022-07-27 DIAGNOSIS — I2510 Atherosclerotic heart disease of native coronary artery without angina pectoris: Secondary | ICD-10-CM | POA: Diagnosis not present

## 2022-07-27 DIAGNOSIS — I700 Atherosclerosis of aorta: Secondary | ICD-10-CM | POA: Diagnosis not present

## 2022-07-30 ENCOUNTER — Telehealth: Payer: Self-pay | Admitting: Pulmonary Disease

## 2022-07-30 NOTE — Telephone Encounter (Signed)
[facility] Telephone Encounter  ONO 07/19/22 SpO2<88% 2 hr 3 min 56 sec Nadir SpO2 79% Average 90% Interpretation: Qualifies for home oxygen  Please contact patient for oxygen results. Recommend 1L O2 at night Please order if patient ok with proceeding

## 2022-07-30 NOTE — Telephone Encounter (Signed)
Attempted to call pt but unable to reach. Left message to return call.

## 2022-08-01 DIAGNOSIS — R053 Chronic cough: Secondary | ICD-10-CM | POA: Diagnosis not present

## 2022-08-01 DIAGNOSIS — R0989 Other specified symptoms and signs involving the circulatory and respiratory systems: Secondary | ICD-10-CM | POA: Diagnosis not present

## 2022-08-01 DIAGNOSIS — K529 Noninfective gastroenteritis and colitis, unspecified: Secondary | ICD-10-CM | POA: Diagnosis not present

## 2022-08-01 DIAGNOSIS — I480 Paroxysmal atrial fibrillation: Secondary | ICD-10-CM | POA: Diagnosis not present

## 2022-08-01 DIAGNOSIS — Z8679 Personal history of other diseases of the circulatory system: Secondary | ICD-10-CM | POA: Diagnosis not present

## 2022-08-01 DIAGNOSIS — K224 Dyskinesia of esophagus: Secondary | ICD-10-CM | POA: Diagnosis not present

## 2022-08-01 NOTE — Progress Notes (Signed)
Remote pacemaker transmission.   

## 2022-08-02 ENCOUNTER — Ambulatory Visit (HOSPITAL_BASED_OUTPATIENT_CLINIC_OR_DEPARTMENT_OTHER): Payer: Medicare HMO | Admitting: Pulmonary Disease

## 2022-08-02 ENCOUNTER — Encounter (HOSPITAL_BASED_OUTPATIENT_CLINIC_OR_DEPARTMENT_OTHER): Payer: Medicare HMO

## 2022-08-02 NOTE — Telephone Encounter (Signed)
Due to not being able to reach pt, since pt has an upcoming appt scheduled with Dr. Loanne Drilling 1/26, routing this to her as well as [facility] triage pool so this could be discussed with pt during [place].

## 2022-08-03 ENCOUNTER — Other Ambulatory Visit (HOSPITAL_BASED_OUTPATIENT_CLINIC_OR_DEPARTMENT_OTHER): Payer: Self-pay

## 2022-08-03 ENCOUNTER — Ambulatory Visit (INDEPENDENT_AMBULATORY_CARE_PROVIDER_SITE_OTHER): Payer: Medicare HMO | Admitting: Pulmonary Disease

## 2022-08-03 ENCOUNTER — Encounter (HOSPITAL_BASED_OUTPATIENT_CLINIC_OR_DEPARTMENT_OTHER): Payer: Self-pay | Admitting: Pulmonary Disease

## 2022-08-03 VITALS — BP 110/60 | HR 86 | Ht 65.0 in | Wt 180.0 lb

## 2022-08-03 DIAGNOSIS — R053 Chronic cough: Secondary | ICD-10-CM

## 2022-08-03 DIAGNOSIS — G4734 Idiopathic sleep related nonobstructive alveolar hypoventilation: Secondary | ICD-10-CM

## 2022-08-03 DIAGNOSIS — J42 Unspecified chronic bronchitis: Secondary | ICD-10-CM | POA: Diagnosis not present

## 2022-08-03 LAB — PULMONARY FUNCTION TEST
DL/VA: 4.59 ml/min/mmHg/L
DLCO cor: 16.1 ml/min/mmHg
DLCO unc: 16.1 ml/min/mmHg
FEF 25-75 Pre: 2.08 L/sec
FEF2575-%Pred-Pre: 347 %
FEV1-%Pred-Pre: 88 %
FEV1-Pre: 1.26 L
FEV1FVC-%Pred-Pre: 141 %
FEV6-%Pred-Pre: 70 %
FEV6-Pre: 1.27 L
FEV6FVC-%Pred-Pre: 108 %
FVC-%Pred-Pre: 64 %
FVC-Pre: 1.27 L
Pre FEV1/FVC ratio: 100 %
Pre FEV6/FVC Ratio: 100 %
RV % pred: 341 %
RV: 9.44 L
TLC % pred: 215 %
TLC: 11.24 L

## 2022-08-03 MED ORDER — FLUTICASONE-SALMETEROL 100-50 MCG/ACT IN AEPB
1.0000 | INHALATION_SPRAY | Freq: Two times a day (BID) | RESPIRATORY_TRACT | 5 refills | Status: DC
  Filled 2022-08-03: qty 60, 30d supply, fill #0

## 2022-08-03 MED ORDER — FLUTICASONE-SALMETEROL 100-50 MCG/ACT IN AEPB
1.0000 | INHALATION_SPRAY | Freq: Two times a day (BID) | RESPIRATORY_TRACT | 5 refills | Status: AC
  Filled 2022-08-03: qty 60, 30d supply, fill #0

## 2022-08-03 NOTE — Progress Notes (Unsigned)
Subjective:   PATIENT ID: Dana Morrison GENDER: female DOB: 02-27-1924, MRN: [id number]  No chief complaint on file.   Reason for Visit: Follow-up shortness of breath  [name] is a 89 year old female with CAD s/p stent 2016, pAF, s/p PPM, HLD, esophageal dysmotility who presents for shortness of breath.   Initial consult She has a long standing history of shortness of breath for >10 years since her cardiac arrest in 2010. Followed by cardiology, on bumex for lower extremity edema. Referred for wheezing and pulmonary evaluation.  Two years ago she had covid but has had worsening respiratory symptoms in the last 6 months with shortness of breath, chest congestion and nonproductive cough. Worsens with walking, bathing or any upper body movement. Denies wheezing. Denies nocturnal symptoms. She had previously seen a Pulmonologist in [place] a few decades ago and reported needing oxygen at night which did not help.  Present with son who provides additional history.  08/03/22 Daughter-in-law present and helps with additional history. She had tried mucinex and did not receive flutter valve. She continues to have shortness of breath, chest congestion and nonproductive cough. She reports snoring at home. No one available to witness sleep apnea  Social History: Negligible smoking history. 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   Seasonal allergic rhinitis due to pollen 09/12/2017   Urticaria 09/12/2017   Venous insufficiency 09/12/2017     Family History   Problem Relation Age of Onset   Microcephaly Mother    Heart attack Mother    Heart attack Father    Lung cancer Brother    Other Brother        BACK PROBLEMS   Healthy Daughter    Healthy Daughter    Healthy Daughter    Healthy Son      Social History   Occupational History   Not on file  Tobacco Use   Smoking status: Former    Packs/day: 0.25    Years: 15.00    Total pack years: 3.75    Types: Cigarettes    Quit date: 1997    Years since quitting: 27.0   Smokeless tobacco: Never   Tobacco comments:    Started smoking at 65 when husband passed stopped smoking age 66  Vaping Use   Vaping Use: Never used  Substance and Sexual Activity   Alcohol use: Yes    Comment: RARE   Drug use: No   Sexual activity: Not Currently    Allergies  Allergen Reactions   Psyllium Diarrhea   Gluten Meal     Per patient and her son, the need for a gluten free diet is due to some intolerances that she has had for many years. It is not an allergy.    Other     Per patient she has an intolerance to some legumes. She does not have a true allergy. Pt's son confirmed this as well.      Outpatient Medications Prior to Visit  Medication Sig Dispense Refill  apixaban (ELIQUIS) 5 MG TABS tablet Take 1 tablet (5 mg total) by mouth 2 (two) times daily. 60 tablet 5   aspirin 81 MG chewable tablet Chew 1 tablet by mouth daily.     bumetanide (BUMEX) 1 MG tablet Take 1 tablet (1 mg total) by mouth daily. 90 tablet 1   cholestyramine (QUESTRAN) 4 g packet 1 packet mixed with water or non-carbonated drink (Patient not taking: Reported on 12/15/2021)     isosorbide mononitrate (IMDUR) 30 MG 24 hr tablet TAKE 1 TABLET EVERY DAY 90 tablet 2   metoprolol succinate (TOPROL-XL) 25 MG 24 hr tablet TAKE 1 TABLET EVERY DAY 90 tablet 2   MULTAQ 400 MG tablet TAKE 1 TABLET TWICE DAILY WITH MEALS 180 tablet 3   Multiple Vitamin (MULTIVITAMIN) capsule Take 1 capsule by mouth daily.     nitroGLYCERIN (NITROSTAT)  0.4 MG SL tablet Place 0.4 mg under the tongue every 5 (five) minutes as needed for chest pain. (Patient not taking: Reported on 06/28/2022)     pravastatin (PRAVACHOL) 20 MG tablet TAKE 1 TABLET EVERY DAY (NEED MD APPOINTMENT) 90 tablet 1   No facility-administered medications prior to visit.    Review of Systems  Constitutional:  Negative for chills, diaphoresis, fever, malaise/fatigue and weight loss.  HENT:  Negative for congestion.   Respiratory:  Positive for cough and shortness of breath. Negative for hemoptysis, sputum production and wheezing.   Cardiovascular:  Positive for leg swelling. Negative for chest pain and palpitations.     Objective:   There were no vitals filed for this visit.     Physical Exam: General: Elderly, well-appearing, no acute distress HENT: [place], AT Eyes: EOMI, no scleral icterus Respiratory: ***Clear to auscultation bilaterally.  No crackles, wheezing or rales Cardiovascular: RRR, -M/R/G, no JVD Extremities:-Edema,-tenderness Neuro: AAO x4, CNII-XII grossly intact Psych: Normal mood, normal affect   Data Reviewed:  Imaging: CXR reportedly normal per PCP phone conversation CT Chest 07/27/22 - Mild basilar GGO with scattered subpleural reticulation  PFT: None on file  Labs: CBC    Component Value Date/Time   WBC 7.2 08/06/2019 1052   RBC 4.89 08/06/2019 1052   HGB 13.6 08/06/2019 1052   HCT 40.9 08/06/2019 1052   PLT 189 08/06/2019 1052   MCV 84 08/06/2019 1052   MCH 27.8 08/06/2019 1052   MCHC 33.3 08/06/2019 1052   RDW 14.4 08/06/2019 1052   LYMPHSABS 1.2 02/27/2018 1024   EOSABS 0.1 02/27/2018 1024   BASOSABS 0.0 02/27/2018 1024   Absolute eos 02/27/18 -100  Sleep: ONO 07/19/22 SpO2<88% 2 hr 3 min 56 sec Nadir SpO2 79% Average 90% Interpretation: Qualifies for home oxygen    Assessment & Plan:   Discussion: 89 year old female with CAD s/p stent 2016, pAF, s/p PPM, HLD, esophageal dysmotility who presents for shortness of  breath. Reviewed history as noted above. I contacted her PCP, Dr. Francesco Morrison to discuss patient care and recent CXR imaging (normal). Discussed plan as noted below. Addressed questions and concerns. Symptoms atypical for asthma so will hold on bronchodilators for now. Will wait on CT scan to rule out possible bronchiectasis. Will also confirm if nocturnal hypoxemia still present. Counseled on deconditioning and benefit of physical therapy. Patient currently receiving OT for left arm.  Shortness of breath Chronic cough --Reviewed CT with air trapping, minimal scarring --Reviewed PFTs with air trapping --START Wixela 100-50 mcg ONE puff in the morning and evening. Rinse out mouth after use to prevent thrush --ORDER  flutter valve to use 3x a day  Nocturnal Hypoxemia --START wearing 1L O2 at night --ORDER home sleep study [phone] Preferred number for daughter-in-law to arrange)  Health Maintenance Immunization History  Administered Date(s) Administered   Influenza, High Dose Seasonal PF 05/01/2022   PFIZER(Purple Top)SARS-COV-2 Vaccination 09/06/2019, 09/28/2019   CT Lung Screen - not qualified due to age, tobacco history  No orders of the defined types were placed in this encounter. No orders of the defined types were placed in this encounter.   No follow-ups on file.  I have spent a total time of 45-minutes on the day of the appointment reviewing prior documentation, coordinating care and discussing medical diagnosis and plan with the patient/family. Imaging, labs and tests included in this note have been reviewed and interpreted independently by me.  Amyri Frenz Mechele Collin, MD [facility] 08/03/2022 9:38 AM  Office Number [phone]

## 2022-08-03 NOTE — Progress Notes (Signed)
Full PFT Performed Today  

## 2022-08-03 NOTE — Telephone Encounter (Signed)
Discussed ONO in clinic

## 2022-08-03 NOTE — Patient Instructions (Signed)
Shortness of breath Chronic cough --Reviewed CT with air trapping, minimal scarring --Reviewed PFTs with air trapping --START Wixela 100-50 mcg ONE puff in the morning and evening. Rinse out mouth after use to prevent thrush --ORDER flutter valve to use 3x a day  Nocturnal Hypoxemia --START wearing 1L O2 at night --ORDER home sleep study [phone] Preferred number for daughter-in-law to arrange)  Follow-up with me in 3 months

## 2022-08-03 NOTE — Patient Instructions (Signed)
Full PFT Performed Today  

## 2022-08-06 ENCOUNTER — Encounter (HOSPITAL_BASED_OUTPATIENT_CLINIC_OR_DEPARTMENT_OTHER): Payer: Self-pay | Admitting: Pulmonary Disease

## 2022-08-09 ENCOUNTER — Encounter: Payer: Self-pay | Admitting: Pulmonary Disease

## 2022-08-10 ENCOUNTER — Telehealth: Payer: Self-pay | Admitting: Pulmonary Disease

## 2022-08-10 DIAGNOSIS — G4734 Idiopathic sleep related nonobstructive alveolar hypoventilation: Secondary | ICD-10-CM

## 2022-08-10 NOTE — Telephone Encounter (Signed)
Order placed for pt's O2 start at night based off of ONO results and OV.  Attempted to call pt's daughter Anderson Malta letting her know this had been done but unable to reach. Left a detailed message letting her know this was done. Nothing further needed.

## 2022-08-13 DIAGNOSIS — Z7982 Long term (current) use of aspirin: Secondary | ICD-10-CM | POA: Diagnosis not present

## 2022-08-13 DIAGNOSIS — E785 Hyperlipidemia, unspecified: Secondary | ICD-10-CM | POA: Diagnosis not present

## 2022-08-13 DIAGNOSIS — I442 Atrioventricular block, complete: Secondary | ICD-10-CM | POA: Diagnosis not present

## 2022-08-13 DIAGNOSIS — I25118 Atherosclerotic heart disease of native coronary artery with other forms of angina pectoris: Secondary | ICD-10-CM | POA: Diagnosis not present

## 2022-08-13 DIAGNOSIS — I110 Hypertensive heart disease with heart failure: Secondary | ICD-10-CM | POA: Diagnosis not present

## 2022-08-13 DIAGNOSIS — Z7901 Long term (current) use of anticoagulants: Secondary | ICD-10-CM | POA: Diagnosis not present

## 2022-08-13 DIAGNOSIS — I495 Sick sinus syndrome: Secondary | ICD-10-CM | POA: Diagnosis not present

## 2022-08-13 DIAGNOSIS — I5032 Chronic diastolic (congestive) heart failure: Secondary | ICD-10-CM | POA: Diagnosis not present

## 2022-08-13 DIAGNOSIS — I480 Paroxysmal atrial fibrillation: Secondary | ICD-10-CM | POA: Diagnosis not present

## 2022-08-17 ENCOUNTER — Ambulatory Visit: Payer: Medicare HMO

## 2022-08-17 DIAGNOSIS — G4733 Obstructive sleep apnea (adult) (pediatric): Secondary | ICD-10-CM | POA: Diagnosis not present

## 2022-08-17 DIAGNOSIS — G4734 Idiopathic sleep related nonobstructive alveolar hypoventilation: Secondary | ICD-10-CM

## 2022-08-19 DIAGNOSIS — M25561 Pain in right knee: Secondary | ICD-10-CM | POA: Diagnosis not present

## 2022-08-19 DIAGNOSIS — D68318 Other hemorrhagic disorder due to intrinsic circulating anticoagulants, antibodies, or inhibitors: Secondary | ICD-10-CM | POA: Diagnosis not present

## 2022-08-20 DIAGNOSIS — G4733 Obstructive sleep apnea (adult) (pediatric): Secondary | ICD-10-CM | POA: Diagnosis not present

## 2022-08-21 ENCOUNTER — Other Ambulatory Visit: Payer: Self-pay

## 2022-08-21 ENCOUNTER — Emergency Department (HOSPITAL_COMMUNITY): Payer: Medicare HMO

## 2022-08-21 ENCOUNTER — Encounter (HOSPITAL_COMMUNITY): Payer: Self-pay | Admitting: Family Medicine

## 2022-08-21 ENCOUNTER — Inpatient Hospital Stay (HOSPITAL_COMMUNITY)
Admission: EM | Admit: 2022-08-21 | Discharge: 2022-08-29 | DRG: 481 | Disposition: A | Discharge status: Skilled Nursing Facility | Payer: Medicare HMO | Source: Skilled Nursing Facility | Attending: Internal Medicine | Admitting: Internal Medicine

## 2022-08-21 ENCOUNTER — Telehealth: Payer: Self-pay | Admitting: Pulmonary Disease

## 2022-08-21 DIAGNOSIS — K5730 Diverticulosis of large intestine without perforation or abscess without bleeding: Secondary | ICD-10-CM | POA: Diagnosis not present

## 2022-08-21 DIAGNOSIS — D62 Acute posthemorrhagic anemia: Secondary | ICD-10-CM | POA: Diagnosis not present

## 2022-08-21 DIAGNOSIS — I2510 Atherosclerotic heart disease of native coronary artery without angina pectoris: Secondary | ICD-10-CM | POA: Diagnosis present

## 2022-08-21 DIAGNOSIS — J42 Unspecified chronic bronchitis: Secondary | ICD-10-CM | POA: Diagnosis present

## 2022-08-21 DIAGNOSIS — S72301D Unspecified fracture of shaft of right femur, subsequent encounter for closed fracture with routine healing: Secondary | ICD-10-CM | POA: Diagnosis not present

## 2022-08-21 DIAGNOSIS — Z888 Allergy status to other drugs, medicaments and biological substances status: Secondary | ICD-10-CM

## 2022-08-21 DIAGNOSIS — Z7901 Long term (current) use of anticoagulants: Secondary | ICD-10-CM

## 2022-08-21 DIAGNOSIS — Z4789 Encounter for other orthopedic aftercare: Secondary | ICD-10-CM | POA: Diagnosis not present

## 2022-08-21 DIAGNOSIS — Z7951 Long term (current) use of inhaled steroids: Secondary | ICD-10-CM

## 2022-08-21 DIAGNOSIS — Z8249 Family history of ischemic heart disease and other diseases of the circulatory system: Secondary | ICD-10-CM

## 2022-08-21 DIAGNOSIS — Z91018 Allergy to other foods: Secondary | ICD-10-CM

## 2022-08-21 DIAGNOSIS — S72034A Nondisplaced midcervical fracture of right femur, initial encounter for closed fracture: Secondary | ICD-10-CM | POA: Diagnosis not present

## 2022-08-21 DIAGNOSIS — Z801 Family history of malignant neoplasm of trachea, bronchus and lung: Secondary | ICD-10-CM

## 2022-08-21 DIAGNOSIS — M898X9 Other specified disorders of bone, unspecified site: Secondary | ICD-10-CM | POA: Diagnosis present

## 2022-08-21 DIAGNOSIS — W19XXXA Unspecified fall, initial encounter: Secondary | ICD-10-CM | POA: Diagnosis not present

## 2022-08-21 DIAGNOSIS — Z7401 Bed confinement status: Secondary | ICD-10-CM | POA: Diagnosis not present

## 2022-08-21 DIAGNOSIS — E669 Obesity, unspecified: Secondary | ICD-10-CM | POA: Diagnosis present

## 2022-08-21 DIAGNOSIS — S72009A Fracture of unspecified part of neck of unspecified femur, initial encounter for closed fracture: Secondary | ICD-10-CM | POA: Diagnosis not present

## 2022-08-21 DIAGNOSIS — S72001D Fracture of unspecified part of neck of right femur, subsequent encounter for closed fracture with routine healing: Secondary | ICD-10-CM | POA: Diagnosis not present

## 2022-08-21 DIAGNOSIS — I480 Paroxysmal atrial fibrillation: Secondary | ICD-10-CM | POA: Diagnosis not present

## 2022-08-21 DIAGNOSIS — R0902 Hypoxemia: Secondary | ICD-10-CM | POA: Diagnosis present

## 2022-08-21 DIAGNOSIS — Z87891 Personal history of nicotine dependence: Secondary | ICD-10-CM

## 2022-08-21 DIAGNOSIS — I495 Sick sinus syndrome: Secondary | ICD-10-CM | POA: Diagnosis not present

## 2022-08-21 DIAGNOSIS — I5032 Chronic diastolic (congestive) heart failure: Secondary | ICD-10-CM | POA: Diagnosis not present

## 2022-08-21 DIAGNOSIS — J439 Emphysema, unspecified: Secondary | ICD-10-CM | POA: Diagnosis not present

## 2022-08-21 DIAGNOSIS — R079 Chest pain, unspecified: Secondary | ICD-10-CM | POA: Diagnosis not present

## 2022-08-21 DIAGNOSIS — Z955 Presence of coronary angioplasty implant and graft: Secondary | ICD-10-CM

## 2022-08-21 DIAGNOSIS — R059 Cough, unspecified: Secondary | ICD-10-CM | POA: Diagnosis not present

## 2022-08-21 DIAGNOSIS — Z6831 Body mass index (BMI) 31.0-31.9, adult: Secondary | ICD-10-CM

## 2022-08-21 DIAGNOSIS — M80051A Age-related osteoporosis with current pathological fracture, right femur, initial encounter for fracture: Secondary | ICD-10-CM | POA: Diagnosis not present

## 2022-08-21 DIAGNOSIS — S72001A Fracture of unspecified part of neck of right femur, initial encounter for closed fracture: Secondary | ICD-10-CM | POA: Diagnosis present

## 2022-08-21 DIAGNOSIS — Y92009 Unspecified place in unspecified non-institutional (private) residence as the place of occurrence of the external cause: Secondary | ICD-10-CM

## 2022-08-21 DIAGNOSIS — I110 Hypertensive heart disease with heart failure: Secondary | ICD-10-CM | POA: Diagnosis not present

## 2022-08-21 DIAGNOSIS — I5042 Chronic combined systolic (congestive) and diastolic (congestive) heart failure: Secondary | ICD-10-CM | POA: Diagnosis present

## 2022-08-21 DIAGNOSIS — W1809XA Striking against other object with subsequent fall, initial encounter: Secondary | ICD-10-CM | POA: Diagnosis present

## 2022-08-21 DIAGNOSIS — W19XXXD Unspecified fall, subsequent encounter: Secondary | ICD-10-CM | POA: Diagnosis not present

## 2022-08-21 DIAGNOSIS — Z79899 Other long term (current) drug therapy: Secondary | ICD-10-CM

## 2022-08-21 DIAGNOSIS — S3282XA Multiple fractures of pelvis without disruption of pelvic ring, initial encounter for closed fracture: Secondary | ICD-10-CM | POA: Diagnosis not present

## 2022-08-21 DIAGNOSIS — E876 Hypokalemia: Secondary | ICD-10-CM | POA: Diagnosis present

## 2022-08-21 DIAGNOSIS — G4733 Obstructive sleep apnea (adult) (pediatric): Secondary | ICD-10-CM

## 2022-08-21 DIAGNOSIS — R609 Edema, unspecified: Secondary | ICD-10-CM | POA: Diagnosis not present

## 2022-08-21 DIAGNOSIS — M47816 Spondylosis without myelopathy or radiculopathy, lumbar region: Secondary | ICD-10-CM | POA: Diagnosis not present

## 2022-08-21 DIAGNOSIS — J479 Bronchiectasis, uncomplicated: Secondary | ICD-10-CM | POA: Diagnosis not present

## 2022-08-21 DIAGNOSIS — Z472 Encounter for removal of internal fixation device: Secondary | ICD-10-CM | POA: Diagnosis not present

## 2022-08-21 DIAGNOSIS — Z96659 Presence of unspecified artificial knee joint: Secondary | ICD-10-CM | POA: Diagnosis present

## 2022-08-21 DIAGNOSIS — Z7982 Long term (current) use of aspirin: Secondary | ICD-10-CM

## 2022-08-21 DIAGNOSIS — I509 Heart failure, unspecified: Secondary | ICD-10-CM | POA: Diagnosis not present

## 2022-08-21 DIAGNOSIS — Z950 Presence of cardiac pacemaker: Secondary | ICD-10-CM | POA: Diagnosis not present

## 2022-08-21 DIAGNOSIS — I252 Old myocardial infarction: Secondary | ICD-10-CM

## 2022-08-21 DIAGNOSIS — I10 Essential (primary) hypertension: Secondary | ICD-10-CM | POA: Diagnosis present

## 2022-08-21 DIAGNOSIS — Z96642 Presence of left artificial hip joint: Secondary | ICD-10-CM | POA: Diagnosis present

## 2022-08-21 DIAGNOSIS — M419 Scoliosis, unspecified: Secondary | ICD-10-CM | POA: Diagnosis not present

## 2022-08-21 DIAGNOSIS — Z9981 Dependence on supplemental oxygen: Secondary | ICD-10-CM

## 2022-08-21 DIAGNOSIS — S32501A Unspecified fracture of right pubis, initial encounter for closed fracture: Secondary | ICD-10-CM | POA: Diagnosis not present

## 2022-08-21 DIAGNOSIS — M25551 Pain in right hip: Secondary | ICD-10-CM | POA: Diagnosis not present

## 2022-08-21 DIAGNOSIS — M1611 Unilateral primary osteoarthritis, right hip: Secondary | ICD-10-CM | POA: Diagnosis not present

## 2022-08-21 LAB — CBC WITH DIFFERENTIAL/PLATELET
Abs Immature Granulocytes: 0.06 10*3/uL (ref 0.00–0.07)
Basophils Absolute: 0 10*3/uL (ref 0.0–0.1)
Basophils Relative: 0 %
Eosinophils Absolute: 0 10*3/uL (ref 0.0–0.5)
Eosinophils Relative: 0 %
HCT: 35.9 % — ABNORMAL LOW (ref 36.0–46.0)
Hemoglobin: 11.4 g/dL — ABNORMAL LOW (ref 12.0–15.0)
Immature Granulocytes: 1 %
Lymphocytes Relative: 9 %
Lymphs Abs: 1 10*3/uL (ref 0.7–4.0)
MCH: 27.5 pg (ref 26.0–34.0)
MCHC: 31.8 g/dL (ref 30.0–36.0)
MCV: 86.5 fL (ref 80.0–100.0)
Monocytes Absolute: 1.1 10*3/uL — ABNORMAL HIGH (ref 0.1–1.0)
Monocytes Relative: 10 %
Neutro Abs: 9 10*3/uL — ABNORMAL HIGH (ref 1.7–7.7)
Neutrophils Relative %: 80 %
Platelets: 196 10*3/uL (ref 150–400)
RBC: 4.15 MIL/uL (ref 3.87–5.11)
RDW: 15.3 % (ref 11.5–15.5)
WBC: 11.2 10*3/uL — ABNORMAL HIGH (ref 4.0–10.5)
nRBC: 0 % (ref 0.0–0.2)

## 2022-08-21 LAB — BASIC METABOLIC PANEL
Anion gap: 10 (ref 5–15)
BUN: 31 mg/dL — ABNORMAL HIGH (ref 8–23)
CO2: 25 mmol/L (ref 22–32)
Calcium: 8.4 mg/dL — ABNORMAL LOW (ref 8.9–10.3)
Chloride: 106 mmol/L (ref 98–111)
Creatinine, Ser: 1 mg/dL (ref 0.44–1.00)
GFR, Estimated: 52 mL/min — ABNORMAL LOW (ref >60)
Glucose, Bld: 104 mg/dL — ABNORMAL HIGH (ref 70–99)
Potassium: 3.4 mmol/L — ABNORMAL LOW (ref 3.5–5.1)
Sodium: 141 mmol/L (ref 135–145)

## 2022-08-21 MED ORDER — ONDANSETRON HCL 4 MG/2ML IJ SOLN
4.0000 mg | Freq: Once | INTRAMUSCULAR | Status: AC
  Administered 2022-08-21: 4 mg via INTRAVENOUS
  Filled 2022-08-21: qty 2

## 2022-08-21 MED ORDER — ISOSORBIDE MONONITRATE ER 30 MG PO TB24
30.0000 mg | ORAL_TABLET | Freq: Every day | ORAL | Status: DC
  Administered 2022-08-22 – 2022-08-29 (×8): 30 mg via ORAL
  Filled 2022-08-21 (×8): qty 1

## 2022-08-21 MED ORDER — DRONEDARONE HCL 400 MG PO TABS
400.0000 mg | ORAL_TABLET | Freq: Two times a day (BID) | ORAL | Status: DC
  Administered 2022-08-22 – 2022-08-29 (×15): 400 mg via ORAL
  Filled 2022-08-21 (×16): qty 1

## 2022-08-21 MED ORDER — POTASSIUM CHLORIDE 10 MEQ/100ML IV SOLN
10.0000 meq | INTRAVENOUS | Status: AC
  Administered 2022-08-22 (×3): 10 meq via INTRAVENOUS
  Filled 2022-08-21 (×3): qty 100

## 2022-08-21 MED ORDER — MORPHINE SULFATE (PF) 4 MG/ML IV SOLN
4.0000 mg | Freq: Once | INTRAVENOUS | Status: AC
  Administered 2022-08-21: 4 mg via INTRAVENOUS
  Filled 2022-08-21: qty 1

## 2022-08-21 MED ORDER — MOMETASONE FURO-FORMOTEROL FUM 100-5 MCG/ACT IN AERO
2.0000 | INHALATION_SPRAY | Freq: Two times a day (BID) | RESPIRATORY_TRACT | Status: DC
  Administered 2022-08-22 – 2022-08-29 (×15): 2 via RESPIRATORY_TRACT
  Filled 2022-08-21: qty 8.8

## 2022-08-21 MED ORDER — METOPROLOL SUCCINATE ER 25 MG PO TB24
25.0000 mg | ORAL_TABLET | Freq: Every day | ORAL | Status: DC
  Administered 2022-08-22 – 2022-08-29 (×8): 25 mg via ORAL
  Filled 2022-08-21 (×8): qty 1

## 2022-08-21 MED ORDER — METHOCARBAMOL 500 MG PO TABS
500.0000 mg | ORAL_TABLET | Freq: Four times a day (QID) | ORAL | Status: DC | PRN
  Administered 2022-08-22 – 2022-08-28 (×8): 500 mg via ORAL
  Filled 2022-08-21 (×8): qty 1

## 2022-08-21 MED ORDER — HYDROCODONE-ACETAMINOPHEN 5-325 MG PO TABS
1.0000 | ORAL_TABLET | Freq: Four times a day (QID) | ORAL | Status: DC | PRN
  Administered 2022-08-22 (×3): 1 via ORAL
  Filled 2022-08-21 (×5): qty 1

## 2022-08-21 MED ORDER — PRAVASTATIN SODIUM 40 MG PO TABS
20.0000 mg | ORAL_TABLET | Freq: Every day | ORAL | Status: DC
  Administered 2022-08-22 – 2022-08-29 (×8): 20 mg via ORAL
  Filled 2022-08-21 (×4): qty 1
  Filled 2022-08-21: qty 2
  Filled 2022-08-21 (×3): qty 1

## 2022-08-21 MED ORDER — METHOCARBAMOL 1000 MG/10ML IJ SOLN: 500.0000 mg | Freq: Four times a day (QID) | INTRAVENOUS | Status: DC | PRN

## 2022-08-21 MED ORDER — MORPHINE SULFATE (PF) 2 MG/ML IV SOLN
1.0000 mg | INTRAVENOUS | Status: DC | PRN
  Administered 2022-08-22: 2 mg via INTRAVENOUS
  Administered 2022-08-22: 1 mg via INTRAVENOUS
  Administered 2022-08-22: 2 mg via INTRAVENOUS
  Filled 2022-08-21 (×4): qty 1

## 2022-08-21 MED ORDER — SENNOSIDES-DOCUSATE SODIUM 8.6-50 MG PO TABS: 1.0000 | ORAL_TABLET | Freq: Every evening | ORAL | Status: DC | PRN

## 2022-08-21 NOTE — Telephone Encounter (Signed)
[facility] Telephone Encounter  HST 08/19/22 Mild OSA 11.4 Nadir SpO2 79%  Contacted patient. No answer. Left voice mail to discuss sleep study results. Also called patient's son per DPR to discuss results. No answer so left brief voicemail to discuss continuing nocturnal oxygen vs CPAP.  If patient/family wishes to pursue CPAP, please order autoCPAP 5-15 cmH20 with supplies. Otherwise, let me know if they want a callback

## 2022-08-21 NOTE — Care Plan (Signed)
Orthopaedic Surgery Plan of Care Note  -reviewed history and imaging -right nondisplaced femoral neck fx -will plan for CRPP vs arthroplasty on 08/22/22 -please keep NPO and hold anticoag from [place] -full consult note to follow  Armond Hang, MD Orthopaedic Surgery [facility]

## 2022-08-21 NOTE — ED Triage Notes (Signed)
Pt arrives from independent living, [facility]. Pt stepped and turn, tripped over the walker. Able to get up and get back to her apartment. later on in the evening, started feeling pain in the right femur area. EMS noted a deformity to lateral part of leg, no shortening or rotation noted. On blood thinners, did not hit her head. IV established, 18 g left ac 100 fent en route, vitals 140/80, hr paced 92, 92% ra, o2 at hs 98% 3liters, lung sounds clear.

## 2022-08-21 NOTE — ED Provider Notes (Signed)
[facility] Provider Note   CSN: [id number] Arrival date & time: 08/21/22  1948     History {Add pertinent medical, surgical, social history, OB history to HPI:1} No chief complaint on file.   Dana Morrison is a 89 y.o. female.  89 yo F with a chief complaints of fall.  The patient was doing her laundry and she got off on the wrong floor in the elevator when she tried to get back into the elevator she tripped over her rollator.  Complaining of pain to the right hip.  She denies head injury denies loss consciousness denies neck pain chest pain or back pain.  She has been unable to get up and ambulate since.  911 was called.        Home Medications Prior to Admission medications   Medication Sig Start Date End Date Taking? Authorizing Provider  apixaban (ELIQUIS) 5 MG TABS tablet Take 1 tablet (5 mg total) by mouth 2 (two) times daily. 06/12/22   Josue Hector, MD  aspirin 81 MG chewable tablet Chew 1 tablet by mouth daily. 05/19/17   [provider]  bumetanide (BUMEX) 1 MG tablet Take 1 tablet (1 mg total) by mouth daily. 07/26/22   Josue Hector, MD  cholestyramine Lucrezia Starch) 4 g packet 1 packet mixed with water or non-carbonated drink Patient not taking: Reported on 12/15/2021 02/10/20   [provider]  fluticasone-salmeterol (WIXELA INHUB) 100-50 MCG/ACT AEPB Inhale 1 puff into the lungs 2 (two) times daily. 08/03/22   Margaretha Seeds, MD  isosorbide mononitrate (IMDUR) 30 MG 24 hr tablet TAKE 1 TABLET EVERY DAY 05/02/22   Josue Hector, MD  metoprolol succinate (TOPROL-XL) 25 MG 24 hr tablet TAKE 1 TABLET EVERY DAY 05/02/22   Josue Hector, MD  MULTAQ 400 MG tablet TAKE 1 TABLET TWICE DAILY WITH MEALS 02/23/22   Josue Hector, MD  Multiple Vitamin (MULTIVITAMIN) capsule Take 1 capsule by mouth daily.    [provider]  nitroGLYCERIN (NITROSTAT) 0.4 MG SL tablet Place 0.4 mg under the tongue every 5  (five) minutes as needed for chest pain. Patient not taking: Reported on 06/28/2022    [provider]  pravastatin (PRAVACHOL) 20 MG tablet TAKE 1 TABLET EVERY DAY (NEED MD APPOINTMENT) 03/21/22   Josue Hector, MD      Allergies    Psyllium, Gluten meal, and Other    Review of Systems   Review of Systems  Physical Exam Updated Vital Signs BP (!) 140/90   Pulse 86   Temp 98.1 F (36.7 C) (Oral)   Resp 18   Ht 5' 6"$  (1.676 m)   Wt 87.5 kg   SpO2 93%   BMI 31.15 kg/m  Physical Exam Vitals and nursing note reviewed.  Constitutional:      General: She is not in acute distress.    Appearance: She is well-developed. She is not diaphoretic.  HENT:     Head: Normocephalic and atraumatic.  Eyes:     Pupils: Pupils are equal, round, and reactive to light.  Cardiovascular:     Rate and Rhythm: Normal rate and regular rhythm.     Heart sounds: No murmur heard.    No friction rub. No gallop.  Pulmonary:     Effort: Pulmonary effort is normal.     Breath sounds: No wheezing or rales.  Abdominal:     General: There is no distension.  Palpations: Abdomen is soft.     Tenderness: There is no abdominal tenderness.  Musculoskeletal:        General: Tenderness present.     Cervical back: Normal range of motion and neck supple.     Comments: Pulse motor and sensation intact bilateral lower extremities.  She has pain and bruising to the right upper leg.  Pain with internal and external rotation of the leg.  No obvious pelvic instability.  Skin:    General: Skin is warm and dry.  Neurological:     Mental Status: She is alert and oriented to person, place, and time.  Psychiatric:        Behavior: Behavior normal.     ED Results / Procedures / Treatments   Labs (all labs ordered are listed, but only abnormal results are displayed) Labs Reviewed  CBC WITH DIFFERENTIAL/PLATELET  BASIC METABOLIC PANEL    EKG None  Radiology No results  found.  Procedures Procedures  {Document cardiac monitor, telemetry assessment procedure when appropriate:1}  Medications Ordered in ED Medications  morphine (PF) 4 MG/ML injection 4 mg (has no administration in time range)  ondansetron (ZOFRAN) injection 4 mg (has no administration in time range)    ED Course/ Medical Decision Making/ A&P   {   Click here for ABCD2, HEART and other calculatorsREFRESH Note before signing :1}                          Medical Decision Making Amount and/or Complexity of Data Reviewed Labs: ordered. Radiology: ordered. ECG/medicine tests: ordered.  Risk Prescription drug management.   89 yo F with a chief complaint of right hip pain after fall.  Clinically it is concern for hip fracture, plain film independently interpreted by me with likely a femoral neck fracture.  Plain film the chest independently interpreted by me without pneumothorax or focal infiltrate.   Radiology read of the hip with no obvious fracture, there was some concern for an old pelvic fracture.  Will obtain a CT to better assess.  CT of the pelvis with radiology read is possible hairline fracture.  Clinically I think this is most likely the diagnosis.  Very uncomfortable with movement of the hip still.  Requiring multiple doses of IV narcotics.  Will discuss with orthopedics.  [facility] ortho discussed case, recommends NPO will try and pin in the morning.  Will discuss with medicine.   {Document critical care time when appropriate:1} {Document review of labs and clinical decision tools ie heart score, Chads2Vasc2 etc:1}  {Document your independent review of radiology images, and any outside records:1} {Document your discussion with family members, caretakers, and with consultants:1} {Document social determinants of health affecting pt's care:1} {Document your decision making why or why not admission, treatments were needed:1} Final Clinical Impression(s) / ED Diagnoses Final  diagnoses:  None    Rx / DC Orders ED Discharge Orders     None

## 2022-08-21 NOTE — H&P (Signed)
History and Physical    Dana Morrison [id number] DOB: 04-06-1924 DOA: 08/21/2022  PCP: Sueanne Margarita, DO   Patient coming from: [facility]   Chief Complaint: Right hip pain after a fall   HPI: Dana Morrison is a pleasant 89 y.o. female with medical history significant for hypertension, CAD, chronic HFpEF, paroxysmal atrial fibrillation on Eliquis, and sick sinus syndrome with pacer, now presenting to the emergency department with right hip pain after fall.  Patient reports that she been in her usual state of health today when she tripped over her walker and fell onto her right side.  She has been experiencing worsening right hip pain since then and was sent to the ED for evaluation.  She denies hitting her head or losing consciousness.  She denies any recent illness, fever, chills, chest pain, or dysuria.  She reports that her chronic cough and dyspnea are unchanged.  Her last dose of Eliquis was the morning of 08/21/2022.    ED Course: Upon arrival to the ED, patient is found to be afebrile and saturating mid 90s on room air with normal heart rate and stable blood pressure.  Labs are notable for elevated BUN to creatinine ratio and potassium 3.4.  Chest x-ray is notable for mild cardiomegaly and emphysematous and chronic bronchitic changes, but no acute findings.  CT right femur concerning for nondisplaced transcervical right femoral neck fracture.  Orthopedic surgery was consulted by the ED physician and the patient was treated with 2 doses of morphine and 2 doses of Zofran.  Review of Systems:  All other systems reviewed and apart from HPI, are negative.  Past Medical History:  Diagnosis Date   Atrial fibrillation ([place])    Chronic diastolic heart failure ([place]) 09/12/2017   Congestive heart failure (CHF) ([facility])    estimated ejection fraction is [id number]; diastolic   Coronary artery disease    Decreased vision 09/12/2017   Hx of myocardial infarction 2010   Hypertension    Osteoarthritis of hip  09/12/2017   Osteoarthritis of knee 09/12/2017   Paroxysmal atrial fibrillation ([place]) 09/12/2017   Scalp psoriasis 09/12/2017   Seasonal allergic rhinitis due to pollen 09/12/2017   Urticaria 09/12/2017   Venous insufficiency 09/12/2017    Past Surgical History:  Procedure Laterality Date   APPENDECTOMY     ARM FRACTURE Left    FRACTURE SURGERY Bilateral    WRISTS   LAPAROSCOPIC LYSIS OF ADHESIONS     PACEMAKER INSERTION     PLACEMENT OF STENTS     X5   REPLACEMENT TOTAL KNEE     REVISION TOTAL HIP ARTHROPLASTY Left     Social History:   reports that she quit smoking about 27 years ago. Her smoking use included cigarettes. She has a 3.75 pack-year smoking history. She has never used smokeless tobacco. She reports current alcohol use. She reports that she does not use drugs.  Allergies  Allergen Reactions   Psyllium Diarrhea   Gluten Meal     Per patient and her son, the need for a gluten free diet is due to some intolerances that she has had for many years. It is not an allergy.    Other     Per patient she has an intolerance to some legumes. She does not have a true allergy. Pt's son confirmed this as well.     Family History  Problem Relation Age of Onset   Microcephaly Mother    Heart attack Mother    Heart attack Father  Lung cancer Brother    Other Brother        BACK PROBLEMS   Healthy Daughter    Healthy Daughter    Healthy Daughter    Healthy Son      Prior to Admission medications   Medication Sig Start Date End Date Taking? Authorizing Provider  apixaban (ELIQUIS) 5 MG TABS tablet Take 1 tablet (5 mg total) by mouth 2 (two) times daily. 06/12/22   Josue Hector, MD  aspirin 81 MG chewable tablet Chew 1 tablet by mouth daily. 05/19/17   [provider]  bumetanide (BUMEX) 1 MG tablet Take 1 tablet (1 mg total) by mouth daily. 07/26/22   Josue Hector, MD  cholestyramine Lucrezia Starch) 4 g packet 1 packet mixed with water or non-carbonated drink Patient not  taking: Reported on 12/15/2021 02/10/20   [provider]  fluticasone-salmeterol (WIXELA INHUB) 100-50 MCG/ACT AEPB Inhale 1 puff into the lungs 2 (two) times daily. 08/03/22   Margaretha Seeds, MD  isosorbide mononitrate (IMDUR) 30 MG 24 hr tablet TAKE 1 TABLET EVERY DAY 05/02/22   Josue Hector, MD  metoprolol succinate (TOPROL-XL) 25 MG 24 hr tablet TAKE 1 TABLET EVERY DAY 05/02/22   Josue Hector, MD  MULTAQ 400 MG tablet TAKE 1 TABLET TWICE DAILY WITH MEALS 02/23/22   Josue Hector, MD  Multiple Vitamin (MULTIVITAMIN) capsule Take 1 capsule by mouth daily.    [provider]  nitroGLYCERIN (NITROSTAT) 0.4 MG SL tablet Place 0.4 mg under the tongue every 5 (five) minutes as needed for chest pain. Patient not taking: Reported on 06/28/2022    [provider]  pravastatin (PRAVACHOL) 20 MG tablet TAKE 1 TABLET EVERY DAY (NEED MD APPOINTMENT) 03/21/22   Josue Hector, MD    Physical Exam: Vitals:   08/21/22 1955 08/21/22 2010 08/21/22 2015 08/21/22 2315  BP: (!) 140/66  (!) 140/90 118/66  Pulse: 87  86 88  Resp: 18  18   Temp: 97.9 F (36.6 C) 98.1 F (36.7 C)    TempSrc:  Oral    SpO2: 96%  93% 91%  Weight:      Height:        Constitutional: NAD, calm  Eyes: PERTLA, lids and conjunctivae normal ENMT: Mucous membranes are moist. Posterior pharynx clear of any exudate or lesions.   Neck: supple, no masses  Respiratory: Frequent cough, no wheezing, no crackles. No accessory muscle use.  Cardiovascular: S1 & S2 heard, regular rate and rhythm. No JVD. Abdomen: No distension, no tenderness, soft. Bowel sounds active.  Musculoskeletal: no clubbing / cyanosis. Right hip tenderness and deformity, neurovascularly intact distally.   Skin: no significant rashes, lesions, ulcers. Warm, dry, well-perfused. Neurologic: CN 2-12 grossly intact. Moving all extremities. Alert and oriented to person, place, and situation.  Psychiatric: Pleasant. Cooperative.     Labs and Imaging on Admission: I have personally reviewed following labs and imaging studies  CBC: Recent Labs  Lab 08/21/22 2021  WBC 11.2*  NEUTROABS 9.0*  HGB 11.4*  HCT 35.9*  MCV 86.5  PLT 123456   Basic Metabolic Panel: Recent Labs  Lab 08/21/22 2021  NA 141  K 3.4*  CL 106  CO2 25  GLUCOSE 104*  BUN 31*  CREATININE 1.00  CALCIUM 8.4*   GFR: Estimated Creatinine Clearance: 36.7 mL/min (by C-G formula based on SCr of 1 mg/dL). Liver Function Tests: No results for input(s): "AST", "ALT", "ALKPHOS", "BILITOT", "PROT", "ALBUMIN" in the last  168 hours. No results for input(s): "LIPASE", "AMYLASE" in the last 168 hours. No results for input(s): "AMMONIA" in the last 168 hours. Coagulation Profile: No results for input(s): "INR", "PROTIME" in the last 168 hours. Cardiac Enzymes: No results for input(s): "CKTOTAL", "CKMB", "CKMBINDEX", "TROPONINI" in the last 168 hours. BNP (last 3 results) No results for input(s): "PROBNP" in the last 8760 hours. HbA1C: No results for input(s): "HGBA1C" in the last 72 hours. CBG: No results for input(s): "GLUCAP" in the last 168 hours. Lipid Profile: No results for input(s): "CHOL", "HDL", "LDLCALC", "TRIG", "CHOLHDL", "LDLDIRECT" in the last 72 hours. Thyroid Function Tests: No results for input(s): "TSH", "T4TOTAL", "FREET4", "T3FREE", "THYROIDAB" in the last 72 hours. Anemia Panel: No results for input(s): "VITAMINB12", "FOLATE", "FERRITIN", "TIBC", "IRON", "RETICCTPCT" in the last 72 hours. Urine analysis: No results found for: "COLORURINE", "APPEARANCEUR", "LABSPEC", "PHURINE", "GLUCOSEU", "HGBUR", "BILIRUBINUR", "KETONESUR", "PROTEINUR", "UROBILINOGEN", "NITRITE", "LEUKOCYTESUR" Sepsis Labs: @LABRCNTIP$ (procalcitonin:4,lacticidven:4) )No results found for this or any previous visit (from the past 240 hour(s)).   Radiological Exams on Admission: CT PELVIS WO CONTRAST  Result Date: 08/21/2022 CLINICAL DATA:  Hip trauma,  fracture suspected, xray done. Right hip pain EXAM: CT PELVIS WITHOUT CONTRAST TECHNIQUE: Multidetector CT imaging of the pelvis was performed following the standard protocol without intravenous contrast. RADIATION DOSE REDUCTION: This exam was performed according to the departmental dose-optimization program which includes automated exposure control, adjustment of the mA and/or kV according to patient size and/or use of iterative reconstruction technique. COMPARISON:  None Available. FINDINGS: Urinary Tract:  No abnormality visualized. Bowel: Marked sigmoid diverticulosis. Visualized bowel loops are otherwise unremarkable. Vascular/Lymphatic: Extensive aortoiliac atherosclerotic calcification. No aortic aneurysm. No pathologic adenopathy. Reproductive: Gas within the uterine cavity is nonspecific and may relate to instrumentation though endometritis could appear similarly. The pelvic organs are otherwise unremarkable on this noncontrast examination. Other:  None Musculoskeletal: The osseous structures are diffusely osteopenic. There is abnormal angulation of the right femoral neck as well as a subtle sagittally oriented fracture plane best seen on coronal image # 69/9 in keeping with a minimally angulated, nondisplaced transcervical femoral neck fracture. No dislocation. Remote healed fractures of the right superior and inferior pubic rami. Left hip bipolar hemiarthroplasty has been performed. Advanced degenerative changes are seen within the right hip. IMPRESSION: 1. Faintly visualized, minimally angulated, nondisplaced transcervical right femoral neck fracture. The fracture is poorly visualized, however, due to marked osteopenia. If indicated, this fracture could be confirmed with MRI examination. 2. Gas within the uterine cavity is nonspecific and may relate to instrumentation though endometritis could appear similarly. Correlation with clinical and laboratory examination may be helpful. 3. Marked sigmoid  diverticulosis. 4. Extensive aortoiliac atherosclerotic calcification. Aortic Atherosclerosis (ICD10-I70.0). Electronically Signed   By: Fidela Salisbury M.D.   On: 08/21/2022 22:13   DG Chest Port 1 View  Result Date: 08/21/2022 CLINICAL DATA:  Hip pain after a fall. EXAM: PORTABLE CHEST 1 VIEW COMPARISON:  10/19/2019 FINDINGS: Cardiac pacemaker. Mild cardiac enlargement. Emphysematous changes in the lungs. Peribronchial thickening and bronchiectasis consistent with chronic bronchitis and unchanged. No airspace disease or consolidation. No pleural effusions. No pneumothorax. Mediastinal contours appear intact. Calcification of the aorta. Old appearing bilateral rib fractures. IMPRESSION: Mild cardiac enlargement. No evidence of active pulmonary disease. Emphysematous and chronic bronchitic changes in the lungs. Electronically Signed   By: Lucienne Capers M.D.   On: 08/21/2022 21:15   DG Hip Unilat W or Wo Pelvis 2-3 Views Right  Result Date: 08/21/2022 CLINICAL DATA:  Hip pain  after a fall.  Trip and fall injury. EXAM: DG HIP (WITH OR WITHOUT PELVIS) 2-3V RIGHT COMPARISON:  None Available. FINDINGS: Degenerative changes in the right hip with joint space narrowing and sclerosis and prominent osteophyte formation on the femoral head. No evidence of acute fracture or dislocation of the hip. No focal bone lesions identified. Deformities demonstrated in the right superior and inferior pubic rami as well as in the right iliac bone. Age is indeterminate. Consider CT if clinical indications suggest acute pelvic fracture. Previous left hip arthroplasty. Degenerative changes in the lumbar spine with scoliosis convex towards the right. IMPRESSION: 1. Degenerative changes in the right hip. No acute displaced fractures identified. 2. Fracture deformities in the right hemipelvis including the iliac bone, superior and inferior pubic rami. Age is indeterminate and if acute fracture is suspected, consider CT. Electronically  Signed   By: Lucienne Capers M.D.   On: 08/21/2022 21:14    EKG: Independently reviewed. Sinus rhythm, PVCs, non-specific IVCD, LAD.   Assessment/Plan   1. Right hip fracture  - Presents with right hip pain after a mechanical fall and found to have right femoral neck fracture  - Appreciate orthopedic surgery consultation  - Based on the available data, Mrs. Gorra presents an estimated 1.1% risk of perioperative MI or cardiac arrest  - Hold Eliqius (last dose was AM of 08/21/22), hold ASA, continue pain-control, NPO    2. PAF  - Hold Eliquis, continue metoprolol and Multaq    3. Chronic HFpEF  - Appears compensated  - Hold diuretic while NPO, continue beta-blocker, monitor volume status    4. CAD  - Hx of MI in 2010, most recent stent in 2016  - No recent angina  - Hold ASA perioperatively, continue beta-blocker and statin    5. Chronic bronchitis  - Stable, no recent change in her chronic cough and SOB  - Continue ICS-LABA, use Duoneb as needed    6. Hypokalemia   - Replace potassium     DVT prophylaxis: SCDs  Code Status: Full  Level of Care: Level of care: Med-Surg Family Communication: Son updated from ED  Disposition Plan:  Patient is from: ILF  Anticipated d/c is to: TBD Anticipated d/c date is: 08/26/22  Patient currently: Pending orthopedic surgery consultation and likely operative hip repair  Consults called: Orthopedic surgery  Admission status: Inpatient     Vianne Bulls, MD [facility]  08/21/2022, 11:59 PM

## 2022-08-21 NOTE — H&P (Incomplete)
History and Physical    Dana Morrison [id number] DOB: 1924/02/21 DOA: 08/21/2022  PCP: Sueanne Margarita, DO   Patient coming from: IL[facility]   Chief Complaint: Right hip pain after a fall   HPI: Dana Morrison is a pleasant 89 y.o. female with medical history significant for hypertension, CAD, chronic HFpEF, paroxysmal atrial fibrillation on Eliquis, and sick sinus syndrome with pacer, now presenting to the emergency department with right hip pain after fall.  Patient reports that she been in her usual state of health today when she tripped over her walker and fell onto her right side.  She has been experiencing worsening right hip pain since then and was sent to the ED for evaluation.  She denies hitting her head or losing consciousness.  She denies any recent illness, fever, chills, chest pain, or dysuria.  She reports that her chronic cough and dyspnea are unchanged.  Her last dose of Eliquis was the morning of 08/21/2022.    ED Course: Upon arrival to the ED, patient is found to be afebrile and saturating mid 90s on room air with normal heart rate and stable blood pressure.  Labs are notable for elevated BUN to creatinine ratio and potassium 3.4.  Chest x-ray is notable for mild cardiomegaly and emphysematous and chronic bronchitic changes, but no acute findings.  CT right femur concerning for nondisplaced transcervical right femoral neck fracture.  Orthopedic surgery was consulted by the ED physician and the patient was treated with 2 doses of morphine and 2 doses of Zofran.  Review of Systems:  All other systems reviewed and apart from HPI, are negative.  Past Medical History:  Diagnosis Date  . Atrial fibrillation ([place])   . Chronic diastolic heart failure ([place]) 09/12/2017  . Congestive heart failure (CHF) ([address])    estimated ejection fraction is [id number]; diastolic  . Coronary artery disease   . Decreased vision 09/12/2017  . Hx of myocardial infarction 2010  . Hypertension   . Osteoarthritis  of hip 09/12/2017  . Osteoarthritis of knee 09/12/2017  . Paroxysmal atrial fibrillation ([place]) 09/12/2017  . Scalp psoriasis 09/12/2017  . Seasonal allergic rhinitis due to pollen 09/12/2017  . Urticaria 09/12/2017  . Venous insufficiency 09/12/2017    Past Surgical History:  Procedure Laterality Date  . APPENDECTOMY    . ARM FRACTURE Left   . FRACTURE SURGERY Bilateral    WRISTS  . LAPAROSCOPIC LYSIS OF ADHESIONS    . PACEMAKER INSERTION    . PLACEMENT OF STENTS     X5  . REPLACEMENT TOTAL KNEE    . REVISION TOTAL HIP ARTHROPLASTY Left     Social History:   reports that she quit smoking about 27 years ago. Her smoking use included cigarettes. She has a 3.75 pack-year smoking history. She has never used smokeless tobacco. She reports current alcohol use. She reports that she does not use drugs.  Allergies  Allergen Reactions  . Psyllium Diarrhea  . Gluten Meal     Per patient and her son, the need for a gluten free diet is due to some intolerances that she has had for many years. It is not an allergy.   . Other     Per patient she has an intolerance to some legumes. She does not have a true allergy. Pt's son confirmed this as well.     Family History  Problem Relation Age of Onset  . Microcephaly Mother   . Heart attack Mother   . Heart attack Father   .  Lung cancer Brother   . Other Brother        BACK PROBLEMS  . Healthy Daughter   . Healthy Daughter   . Healthy Daughter   . Healthy Son      Prior to Admission medications   Medication Sig Start Date End Date Taking? Authorizing Provider  apixaban (ELIQUIS) 5 MG TABS tablet Take 1 tablet (5 mg total) by mouth 2 (two) times daily. 06/12/22   Josue Hector, MD  aspirin 81 MG chewable tablet Chew 1 tablet by mouth daily. 05/19/17   [provider]  bumetanide (BUMEX) 1 MG tablet Take 1 tablet (1 mg total) by mouth daily. 07/26/22   Josue Hector, MD  cholestyramine Lucrezia Starch) 4 g packet 1 packet mixed with water or  non-carbonated drink Patient not taking: Reported on 12/15/2021 02/10/20   [provider]  fluticasone-salmeterol (WIXELA INHUB) 100-50 MCG/ACT AEPB Inhale 1 puff into the lungs 2 (two) times daily. 08/03/22   Margaretha Seeds, MD  isosorbide mononitrate (IMDUR) 30 MG 24 hr tablet TAKE 1 TABLET EVERY DAY 05/02/22   Josue Hector, MD  metoprolol succinate (TOPROL-XL) 25 MG 24 hr tablet TAKE 1 TABLET EVERY DAY 05/02/22   Josue Hector, MD  MULTAQ 400 MG tablet TAKE 1 TABLET TWICE DAILY WITH MEALS 02/23/22   Josue Hector, MD  Multiple Vitamin (MULTIVITAMIN) capsule Take 1 capsule by mouth daily.    [provider]  nitroGLYCERIN (NITROSTAT) 0.4 MG SL tablet Place 0.4 mg under the tongue every 5 (five) minutes as needed for chest pain. Patient not taking: Reported on 06/28/2022    [provider]  pravastatin (PRAVACHOL) 20 MG tablet TAKE 1 TABLET EVERY DAY (NEED MD APPOINTMENT) 03/21/22   Josue Hector, MD    Physical Exam: Vitals:   08/21/22 1955 08/21/22 2010 08/21/22 2015 08/21/22 2315  BP: (!) 140/66  (!) 140/90 118/66  Pulse: 87  86 88  Resp: 18  18   Temp: 97.9 F (36.6 C) 98.1 F (36.7 C)    TempSrc:  Oral    SpO2: 96%  93% 91%  Weight:      Height:         Constitutional: NAD, calm  Eyes: PERTLA, lids and conjunctivae normal ENMT: Mucous membranes are moist. Posterior pharynx clear of any exudate or lesions.   Neck: supple, no masses  Respiratory: clear to auscultation bilaterally, no wheezing, no crackles. No accessory muscle use.  Cardiovascular: S1 & S2 heard, regular rate and rhythm. No extremity edema. No significant JVD. Abdomen: No distension, no tenderness, soft. Bowel sounds active.  Musculoskeletal: no clubbing / cyanosis. No joint deformity upper and lower extremities.   Skin: no significant rashes, lesions, ulcers. Warm, dry, well-perfused. Neurologic: CN 2-12 grossly intact. Sensation intact, DTR normal. Strength 5/5 in all 4  limbs. Alert and oriented.  Psychiatric: Pleasant. Cooperative.    Labs and Imaging on Admission: I have personally reviewed following labs and imaging studies  CBC: Recent Labs  Lab 08/21/22 2021  WBC 11.2*  NEUTROABS 9.0*  HGB 11.4*  HCT 35.9*  MCV 86.5  PLT 123456   Basic Metabolic Panel: Recent Labs  Lab 08/21/22 2021  NA 141  K 3.4*  CL 106  CO2 25  GLUCOSE 104*  BUN 31*  CREATININE 1.00  CALCIUM 8.4*   GFR: Estimated Creatinine Clearance: 36.7 mL/min (by C-G formula based on SCr of 1 mg/dL). Liver Function Tests: No results for input(s): "AST", "  ALT", "ALKPHOS", "BILITOT", "PROT", "ALBUMIN" in the last 168 hours. No results for input(s): "LIPASE", "AMYLASE" in the last 168 hours. No results for input(s): "AMMONIA" in the last 168 hours. Coagulation Profile: No results for input(s): "INR", "PROTIME" in the last 168 hours. Cardiac Enzymes: No results for input(s): "CKTOTAL", "CKMB", "CKMBINDEX", "TROPONINI" in the last 168 hours. BNP (last 3 results) No results for input(s): "PROBNP" in the last 8760 hours. HbA1C: No results for input(s): "HGBA1C" in the last 72 hours. CBG: No results for input(s): "GLUCAP" in the last 168 hours. Lipid Profile: No results for input(s): "CHOL", "HDL", "LDLCALC", "TRIG", "CHOLHDL", "LDLDIRECT" in the last 72 hours. Thyroid Function Tests: No results for input(s): "TSH", "T4TOTAL", "FREET4", "T3FREE", "THYROIDAB" in the last 72 hours. Anemia Panel: No results for input(s): "VITAMINB12", "FOLATE", "FERRITIN", "TIBC", "IRON", "RETICCTPCT" in the last 72 hours. Urine analysis: No results found for: "COLORURINE", "APPEARANCEUR", "LABSPEC", "PHURINE", "GLUCOSEU", "HGBUR", "BILIRUBINUR", "KETONESUR", "PROTEINUR", "UROBILINOGEN", "NITRITE", "LEUKOCYTESUR" Sepsis Labs: @LABRCNTIP$ (procalcitonin:4,lacticidven:4) )No results found for this or any previous visit (from the past 240 hour(s)).   Radiological Exams on Admission: CT PELVIS WO  CONTRAST  Result Date: 08/21/2022 CLINICAL DATA:  Hip trauma, fracture suspected, xray done. Right hip pain EXAM: CT PELVIS WITHOUT CONTRAST TECHNIQUE: Multidetector CT imaging of the pelvis was performed following the standard protocol without intravenous contrast. RADIATION DOSE REDUCTION: This exam was performed according to the departmental dose-optimization program which includes automated exposure control, adjustment of the mA and/or kV according to patient size and/or use of iterative reconstruction technique. COMPARISON:  None Available. FINDINGS: Urinary Tract:  No abnormality visualized. Bowel: Marked sigmoid diverticulosis. Visualized bowel loops are otherwise unremarkable. Vascular/Lymphatic: Extensive aortoiliac atherosclerotic calcification. No aortic aneurysm. No pathologic adenopathy. Reproductive: Gas within the uterine cavity is nonspecific and may relate to instrumentation though endometritis could appear similarly. The pelvic organs are otherwise unremarkable on this noncontrast examination. Other:  None Musculoskeletal: The osseous structures are diffusely osteopenic. There is abnormal angulation of the right femoral neck as well as a subtle sagittally oriented fracture plane best seen on coronal image # 69/9 in keeping with a minimally angulated, nondisplaced transcervical femoral neck fracture. No dislocation. Remote healed fractures of the right superior and inferior pubic rami. Left hip bipolar hemiarthroplasty has been performed. Advanced degenerative changes are seen within the right hip. IMPRESSION: 1. Faintly visualized, minimally angulated, nondisplaced transcervical right femoral neck fracture. The fracture is poorly visualized, however, due to marked osteopenia. If indicated, this fracture could be confirmed with MRI examination. 2. Gas within the uterine cavity is nonspecific and may relate to instrumentation though endometritis could appear similarly. Correlation with clinical and  laboratory examination may be helpful. 3. Marked sigmoid diverticulosis. 4. Extensive aortoiliac atherosclerotic calcification. Aortic Atherosclerosis (ICD10-I70.0). Electronically Signed   By: Fidela Salisbury M.D.   On: 08/21/2022 22:13   DG Chest Port 1 View  Result Date: 08/21/2022 CLINICAL DATA:  Hip pain after a fall. EXAM: PORTABLE CHEST 1 VIEW COMPARISON:  10/19/2019 FINDINGS: Cardiac pacemaker. Mild cardiac enlargement. Emphysematous changes in the lungs. Peribronchial thickening and bronchiectasis consistent with chronic bronchitis and unchanged. No airspace disease or consolidation. No pleural effusions. No pneumothorax. Mediastinal contours appear intact. Calcification of the aorta. Old appearing bilateral rib fractures. IMPRESSION: Mild cardiac enlargement. No evidence of active pulmonary disease. Emphysematous and chronic bronchitic changes in the lungs. Electronically Signed   By: Lucienne Capers M.D.   On: 08/21/2022 21:15   DG Hip Unilat W or Wo Pelvis 2-3 Views Right  Result Date: 08/21/2022 CLINICAL DATA:  Hip pain after a fall.  Trip and fall injury. EXAM: DG HIP (WITH OR WITHOUT PELVIS) 2-3V RIGHT COMPARISON:  None Available. FINDINGS: Degenerative changes in the right hip with joint space narrowing and sclerosis and prominent osteophyte formation on the femoral head. No evidence of acute fracture or dislocation of the hip. No focal bone lesions identified. Deformities demonstrated in the right superior and inferior pubic rami as well as in the right iliac bone. Age is indeterminate. Consider CT if clinical indications suggest acute pelvic fracture. Previous left hip arthroplasty. Degenerative changes in the lumbar spine with scoliosis convex towards the right. IMPRESSION: 1. Degenerative changes in the right hip. No acute displaced fractures identified. 2. Fracture deformities in the right hemipelvis including the iliac bone, superior and inferior pubic rami. Age is indeterminate and if  acute fracture is suspected, consider CT. Electronically Signed   By: Lucienne Capers M.D.   On: 08/21/2022 21:14    EKG: Independently reviewed. ***  Assessment/Plan Principal Problem:   Closed right hip fracture, initial encounter [facility]) Active Problems:   Paroxysmal atrial fibrillation (HCC)   Chronic diastolic heart failure (HCC)   Hypertension   Sick sinus syndrome (H[facility])   Coronary artery disease   Chronic bronchitis ([place])    1. Right hip fracture  - ***  -  -  -  -  -  -  -   2. PAF  - Hold Eliquis, continue metoprolol and Multaq    3. Chronic HFpEF  - Appears compensated  -  -  -  -  -  -   4.  -  -  -  -  -  -  -  -   5.  -  -  -  -  -  -  -   6.  -  -  -  -  -   7.  -  -  -  -  -  -   8.  -  -  -  -  -  -    DVT prophylaxis: SCDs  Code Status: Full  Level of Care: Level of care: Med-Surg Family Communication: Son updated from ED  Disposition Plan:  Patient is from: ILF  Anticipated d/c is to: TBD Anticipated d/c date is: 08/26/22  Patient currently: Pending orthopedic surgery consultation and likely operative hip repair  Consults called: Orthopedic surgery  Admission status: Inpatient     Vianne Bulls, MD [facility]  08/21/2022, 11:59 PM

## 2022-08-21 NOTE — ED Triage Notes (Signed)
Pt c/o leg pain in the right lateral thigh. Hematoma noted to the area.

## 2022-08-22 ENCOUNTER — Inpatient Hospital Stay (HOSPITAL_COMMUNITY): Payer: Medicare HMO | Admitting: Anesthesiology

## 2022-08-22 ENCOUNTER — Other Ambulatory Visit: Payer: Self-pay

## 2022-08-22 DIAGNOSIS — S72001A Fracture of unspecified part of neck of right femur, initial encounter for closed fracture: Secondary | ICD-10-CM | POA: Diagnosis not present

## 2022-08-22 LAB — BASIC METABOLIC PANEL
Anion gap: 9 (ref 5–15)
BUN: 28 mg/dL — ABNORMAL HIGH (ref 8–23)
CO2: 25 mmol/L (ref 22–32)
Calcium: 8 mg/dL — ABNORMAL LOW (ref 8.9–10.3)
Chloride: 105 mmol/L (ref 98–111)
Creatinine, Ser: 0.92 mg/dL (ref 0.44–1.00)
GFR, Estimated: 57 mL/min — ABNORMAL LOW (ref >60)
Glucose, Bld: 102 mg/dL — ABNORMAL HIGH (ref 70–99)
Potassium: 4.2 mmol/L (ref 3.5–5.1)
Sodium: 139 mmol/L (ref 135–145)

## 2022-08-22 LAB — MAGNESIUM: Magnesium: 2.1 mg/dL (ref 1.7–2.4)

## 2022-08-22 LAB — CBC
HCT: 33.2 % — ABNORMAL LOW (ref 36.0–46.0)
Hemoglobin: 10.4 g/dL — ABNORMAL LOW (ref 12.0–15.0)
MCH: 27.1 pg (ref 26.0–34.0)
MCHC: 31.3 g/dL (ref 30.0–36.0)
MCV: 86.5 fL (ref 80.0–100.0)
Platelets: 170 10*3/uL (ref 150–400)
RBC: 3.84 MIL/uL — ABNORMAL LOW (ref 3.87–5.11)
RDW: 15.2 % (ref 11.5–15.5)
WBC: 7.5 10*3/uL (ref 4.0–10.5)
nRBC: 0 % (ref 0.0–0.2)

## 2022-08-22 MED ORDER — LOPERAMIDE HCL 2 MG PO CAPS
2.0000 mg | ORAL_CAPSULE | Freq: Three times a day (TID) | ORAL | Status: DC
  Administered 2022-08-22 – 2022-08-29 (×16): 2 mg via ORAL
  Filled 2022-08-22 (×18): qty 1

## 2022-08-22 MED ORDER — LOPERAMIDE HCL 2 MG PO CAPS: 2.0000 mg | ORAL_CAPSULE | Freq: Three times a day (TID) | ORAL | Status: DC

## 2022-08-22 MED ORDER — FENTANYL CITRATE (PF) 100 MCG/2ML IJ SOLN
INTRAMUSCULAR | Status: AC
  Administered 2022-08-22: 50 ug via INTRAVENOUS
  Filled 2022-08-22: qty 2

## 2022-08-22 MED ORDER — IPRATROPIUM-ALBUTEROL 0.5-2.5 (3) MG/3ML IN SOLN: 3.0000 mL | RESPIRATORY_TRACT | Status: DC | PRN

## 2022-08-22 MED ORDER — MORPHINE SULFATE (PF) 2 MG/ML IV SOLN
2.0000 mg | INTRAVENOUS | Status: DC | PRN
  Administered 2022-08-22 – 2022-08-29 (×8): 2 mg via INTRAVENOUS
  Filled 2022-08-22 (×9): qty 1

## 2022-08-22 MED ORDER — BUPIVACAINE-EPINEPHRINE (PF) 0.5% -1:200000 IJ SOLN
INTRAMUSCULAR | Status: AC | PRN
  Administered 2022-08-22: 25 mL via PERINEURAL

## 2022-08-22 NOTE — Anesthesia Preprocedure Evaluation (Signed)
Anesthesia Evaluation  Patient identified by MRN, date of birth, ID band Patient awake    Reviewed: Allergy & Precautions, NPO status , Patient's Chart, lab work & pertinent test results  Airway Mallampati: II  TM Distance: >3 FB Neck ROM: Full    Dental no notable dental hx. (+) Partial Upper, Teeth Intact   Pulmonary former smoker   Pulmonary exam normal breath sounds clear to auscultation       Cardiovascular hypertension, + CAD  Normal cardiovascular exam+ pacemaker  Rhythm:Regular Rate:Normal     Neuro/Psych    GI/Hepatic Neg liver ROS,,,  Endo/Other    Renal/GU Lab Results      Component                Value               Date                      CREATININE               0.92                08/22/2022                BUN                      28 (H)              08/22/2022                NA                       139                 08/22/2022                K                        4.2                 08/22/2022                CL                       105                 08/22/2022                CO2                      25                  08/22/2022                Musculoskeletal   Abdominal   Peds  Hematology Pt on Eliquis Lab Results      Component                Value               Date                      WBC                      7.5  08/22/2022                HGB                      10.4 (L)            08/22/2022                HCT                      33.2 (L)            08/22/2022                MCV                      86.5                08/22/2022                PLT                      170                 08/22/2022              Anesthesia Other Findings   Reproductive/Obstetrics                             Anesthesia Physical Anesthesia Plan  ASA: 3  Anesthesia Plan: General   Post-op Pain Management: Ofirmev IV (intra-op)* and Precedex    Induction: Intravenous  PONV Risk Score and Plan: Treatment may vary due to age or medical condition  Airway Management Planned: Oral ETT  Additional Equipment: None  Intra-op Plan:   Post-operative Plan: Extubation in OR  Informed Consent: I have reviewed the patients History and Physical, chart, labs and discussed the procedure including the risks, benefits and alternatives for the proposed anesthesia with the patient or authorized representative who has indicated his/her understanding and acceptance.     Dental advisory given  Plan Discussed with:   Anesthesia Plan Comments:        Anesthesia Quick Evaluation

## 2022-08-22 NOTE — ED Notes (Signed)
ED TO INPATIENT HANDOFF REPORT  ED Nurse Name and Phone #: Su Grand [phone]  S Name/Age/Gender Dana Morrison 89 y.o. female Room/Bed: 039C/039C  Code Status   Code Status: Full Code  Home/SNF/Other Home Patient oriented to: self, place, time, and situation Is this baseline? Yes   Triage Complete: Triage complete  Chief Complaint Closed right hip fracture, initial encounter [facility]) [S72.001A]  Triage Note Pt arrives from independent living, [facility]. Pt stepped and turn, tripped over the walker. Able to get up and get back to her apartment. later on in the evening, started feeling pain in the right femur area. EMS noted a deformity to lateral part of leg, no shortening or rotation noted. On blood thinners, did not hit her head. IV established, 18 g left ac 100 fent en route, vitals 140/80, hr paced 92, 92% ra, o2 at hs 98% 3liters, lung sounds clear.   Pt c/o leg pain in the right lateral thigh. Hematoma noted to the area.    Allergies Allergies  Allergen Reactions   Psyllium Diarrhea   Gluten Meal     Per patient and her son, the need for a gluten free diet is due to some intolerances that she has had for many years. It is not an allergy.    Other     Per patient she has an intolerance to some legumes. She does not have a true allergy. Pt's son confirmed this as well.     Level of Care/Admitting Diagnosis ED Disposition     ED Disposition  Admit   Condition  --   [place]: [place] [[id number]]  Level of Care: Med-Surg [16]  May admit patient to [facility] or [facility] if equivalent level of care is available:: Yes  Covid Evaluation: Asymptomatic - no recent exposure (last 10 days) testing not required  Diagnosis: Closed right hip fracture, initial encounter [facility]) [id number]  Admitting Physician: Vianne Bulls [id number]  Attending Physician: Vianne Bulls [id number]  Certification:: I certify this patient will need inpatient  services for at least 2 midnights  Estimated Length of Stay: 4          B Medical/Surgery History Past Medical History:  Diagnosis Date   Atrial fibrillation ([place])    Chronic diastolic heart failure ([place]) 09/12/2017   Congestive heart failure (CHF) ([place])    estimated ejection fraction is [id number]; diastolic   Coronary artery disease    Decreased vision 09/12/2017   Hx of myocardial infarction 2010   Hypertension    Osteoarthritis of hip 09/12/2017   Osteoarthritis of knee 09/12/2017   Paroxysmal atrial fibrillation ([place]) 09/12/2017   Scalp psoriasis 09/12/2017   Seasonal allergic rhinitis due to pollen 09/12/2017   Urticaria 09/12/2017   Venous insufficiency 09/12/2017   Past Surgical History:  Procedure Laterality Date   APPENDECTOMY     ARM FRACTURE Left    FRACTURE SURGERY Bilateral    WRISTS   LAPAROSCOPIC LYSIS OF ADHESIONS     PACEMAKER INSERTION     PLACEMENT OF STENTS     X5   REPLACEMENT TOTAL KNEE     REVISION TOTAL HIP ARTHROPLASTY Left      A IV Location/Drains/Wounds Patient Lines/Drains/Airways Status     Active Line/Drains/Airways     Name Placement date Placement time Site Days   Peripheral IV 08/21/22 18 G Left Antecubital 08/21/22  2020  Antecubital  1  Intake/Output Last 24 hours No intake or output data in the 24 hours ending 08/22/22 1214  Labs/Imaging Results for orders placed or performed during the hospital encounter of 08/21/22 (from the past 48 hour(s))  CBC with Differential     Status: Abnormal   Collection Time: 08/21/22  8:21 PM  Result Value Ref Range   WBC 11.2 (H) 4.0 - 10.5 K/uL   RBC 4.15 3.87 - 5.11 MIL/uL   Hemoglobin 11.4 (L) 12.0 - 15.0 g/dL   HCT 35.9 (L) 36.0 - 46.0 %   MCV 86.5 80.0 - 100.0 fL   MCH 27.5 26.0 - 34.0 pg   MCHC 31.8 30.0 - 36.0 g/dL   RDW 15.3 11.5 - 15.5 %   Platelets 196 150 - 400 K/uL   nRBC 0.0 0.0 - 0.2 %   Neutrophils Relative % 80 %   Neutro Abs 9.0 (H) 1.7 - 7.7 K/uL   Lymphocytes Relative  9 %   Lymphs Abs 1.0 0.7 - 4.0 K/uL   Monocytes Relative 10 %   Monocytes Absolute 1.1 (H) 0.1 - 1.0 K/uL   Eosinophils Relative 0 %   Eosinophils Absolute 0.0 0.0 - 0.5 K/uL   Basophils Relative 0 %   Basophils Absolute 0.0 0.0 - 0.1 K/uL   Immature Granulocytes 1 %   Abs Immature Granulocytes 0.06 0.00 - 0.07 K/uL    Comment: Performed at [facility], [address], [place], [place] [id number]  Basic metabolic panel     Status: Abnormal   Collection Time: 08/21/22  8:21 PM  Result Value Ref Range   Sodium 141 135 - 145 mmol/L   Potassium 3.4 (L) 3.5 - 5.1 mmol/L   Chloride 106 98 - 111 mmol/L   CO2 25 22 - 32 mmol/L   Glucose, Bld 104 (H) 70 - 99 mg/dL    Comment: Glucose reference range applies only to samples taken after fasting for at least 8 hours.   BUN 31 (H) 8 - 23 mg/dL   Creatinine, Ser 1.00 0.44 - 1.00 mg/dL   Calcium 8.4 (L) 8.9 - 10.3 mg/dL   GFR, Estimated 52 (L) >60 mL/min    Comment: (NOTE) Calculated using the CKD-EPI Creatinine Equation (2021)    Anion gap 10 5 - 15    Comment: Performed at [facility] [address] [place], [place] [address]  CBC     Status: Abnormal   Collection Time: 08/22/22  4:30 AM  Result Value Ref Range   WBC 7.5 4.0 - 10.5 K/uL   RBC 3.84 (L) 3.87 - 5.11 MIL/uL   Hemoglobin 10.4 (L) 12.0 - 15.0 g/dL   HCT 33.2 (L) 36.0 - 46.0 %   MCV 86.5 80.0 - 100.0 fL   MCH 27.1 26.0 - 34.0 pg   MCHC 31.3 30.0 - 36.0 g/dL   RDW 15.2 11.5 - 15.5 %   Platelets 170 150 - 400 K/uL   nRBC 0.0 0.0 - 0.2 %    Comment: Performed at [facility], [place] [address] [place], [place] [id number]  Basic metabolic panel     Status: Abnormal   Collection Time: 08/22/22  4:30 AM  Result Value Ref Range   Sodium 139 135 - 145 mmol/L   Potassium 4.2 3.5 - 5.1 mmol/L   Chloride 105 98 - 111 mmol/L   CO2 25 22 - 32 mmol/L   Glucose, Bld 102 (H) 70 - 99 mg/dL  Comment: Glucose reference range applies only to samples taken after fasting  for at least 8 hours.   BUN 28 (H) 8 - 23 mg/dL   Creatinine, Ser 0.92 0.44 - 1.00 mg/dL   Calcium 8.0 (L) 8.9 - 10.3 mg/dL   GFR, Estimated 57 (L) >60 mL/min    Comment: (NOTE) Calculated using the CKD-EPI Creatinine Equation (2021)    Anion gap 9 5 - 15    Comment: Performed at [facility] [address] [place], [place] [address]  Magnesium     Status: None   Collection Time: 08/22/22  4:30 AM  Result Value Ref Range   Magnesium 2.1 1.7 - 2.4 mg/dL    Comment: Performed at [facility] [address], [place], [place] [address]   CT PELVIS WO CONTRAST  Result Date: 08/21/2022 CLINICAL DATA:  Hip trauma, fracture suspected, xray done. Right hip pain EXAM: CT PELVIS WITHOUT CONTRAST TECHNIQUE: Multidetector CT imaging of the pelvis was performed following the standard protocol without intravenous contrast. RADIATION DOSE REDUCTION: This exam was performed according to the departmental dose-optimization program which includes automated exposure control, adjustment of the mA and/or kV according to patient size and/or use of iterative reconstruction technique. COMPARISON:  None Available. FINDINGS: Urinary Tract:  No abnormality visualized. Bowel: Marked sigmoid diverticulosis. Visualized bowel loops are otherwise unremarkable. Vascular/Lymphatic: Extensive aortoiliac atherosclerotic calcification. No aortic aneurysm. No pathologic adenopathy. Reproductive: Gas within the uterine cavity is nonspecific and may relate to instrumentation though endometritis could appear similarly. The pelvic organs are otherwise unremarkable on this noncontrast examination. Other:  None Musculoskeletal: The osseous structures are diffusely osteopenic. There is abnormal angulation of the right femoral neck as well as a subtle sagittally oriented fracture plane best seen on coronal image # 69/9 in keeping with a minimally angulated, nondisplaced transcervical femoral neck fracture. No dislocation. Remote  healed fractures of the right superior and inferior pubic rami. Left hip bipolar hemiarthroplasty has been performed. Advanced degenerative changes are seen within the right hip. IMPRESSION: 1. Faintly visualized, minimally angulated, nondisplaced transcervical right femoral neck fracture. The fracture is poorly visualized, however, due to marked osteopenia. If indicated, this fracture could be confirmed with MRI examination. 2. Gas within the uterine cavity is nonspecific and may relate to instrumentation though endometritis could appear similarly. Correlation with clinical and laboratory examination may be helpful. 3. Marked sigmoid diverticulosis. 4. Extensive aortoiliac atherosclerotic calcification. Aortic Atherosclerosis (ICD10-I70.0). Electronically Signed   By: Fidela Salisbury M.D.   On: 08/21/2022 22:13   DG Chest Port 1 View  Result Date: 08/21/2022 CLINICAL DATA:  Hip pain after a fall. EXAM: PORTABLE CHEST 1 VIEW COMPARISON:  10/19/2019 FINDINGS: Cardiac pacemaker. Mild cardiac enlargement. Emphysematous changes in the lungs. Peribronchial thickening and bronchiectasis consistent with chronic bronchitis and unchanged. No airspace disease or consolidation. No pleural effusions. No pneumothorax. Mediastinal contours appear intact. Calcification of the aorta. Old appearing bilateral rib fractures. IMPRESSION: Mild cardiac enlargement. No evidence of active pulmonary disease. Emphysematous and chronic bronchitic changes in the lungs. Electronically Signed   By: Lucienne Capers M.D.   On: 08/21/2022 21:15   DG Hip Unilat W or Wo Pelvis 2-3 Views Right  Result Date: 08/21/2022 CLINICAL DATA:  Hip pain after a fall.  Trip and fall injury. EXAM: DG HIP (WITH OR WITHOUT PELVIS) 2-3V RIGHT COMPARISON:  None Available. FINDINGS: Degenerative changes in the right hip with joint space narrowing and sclerosis and prominent osteophyte formation on the femoral head.  No evidence of acute fracture or dislocation  of the hip. No focal bone lesions identified. Deformities demonstrated in the right superior and inferior pubic rami as well as in the right iliac bone. Age is indeterminate. Consider CT if clinical indications suggest acute pelvic fracture. Previous left hip arthroplasty. Degenerative changes in the lumbar spine with scoliosis convex towards the right. IMPRESSION: 1. Degenerative changes in the right hip. No acute displaced fractures identified. 2. Fracture deformities in the right hemipelvis including the iliac bone, superior and inferior pubic rami. Age is indeterminate and if acute fracture is suspected, consider CT. Electronically Signed   By: Lucienne Capers M.D.   On: 08/21/2022 21:14    Pending Labs Unresulted Labs (From admission, onward)     Start     Ordered   08/22/22 0500  CBC  Daily,   R      08/21/22 2359   08/22/22 [id number]  Basic metabolic panel  Daily,   R      08/21/22 2359            Vitals/Pain Today's Vitals   08/22/22 0900 08/22/22 0942 08/22/22 1000 08/22/22 1004  BP: 114/62 114/62 (!) 114/57   Pulse: 84 89 87   Resp: 20  (!) 21   Temp:    (!) 97.5 F (36.4 C)  TempSrc:    Oral  SpO2: 94%  90%   Weight:      Height:      PainSc:        Isolation Precautions No active isolations  Medications Medications  isosorbide mononitrate (IMDUR) 24 hr tablet 30 mg (30 mg Oral Given 08/22/22 0943)  metoprolol succinate (TOPROL-XL) 24 hr tablet 25 mg (25 mg Oral Given 08/22/22 0942)  dronedarone (MULTAQ) tablet 400 mg (400 mg Oral Given 08/22/22 0751)  pravastatin (PRAVACHOL) tablet 20 mg (20 mg Oral Given 08/22/22 0943)  mometasone-formoterol (DULERA) 100-5 MCG/ACT inhaler 2 puff (2 puffs Inhalation Given 08/22/22 0752)  HYDROcodone-acetaminophen (NORCO/VICODIN) 5-325 MG per tablet 1 tablet (1 tablet Oral Given 08/22/22 0855)  morphine (PF) 2 MG/ML injection 1-2 mg (2 mg Intravenous Given 08/22/22 0750)  methocarbamol (ROBAXIN) tablet 500 mg (500 mg Oral Given 08/22/22  0951)    Or  methocarbamol (ROBAXIN) 500 mg in dextrose 5 % 50 mL IVPB ( Intravenous See Alternative 08/22/22 0951)  senna-docusate (Senokot-S) tablet 1 tablet (has no administration in time range)  ipratropium-albuterol (DUONEB) 0.5-2.5 (3) MG/3ML nebulizer solution 3 mL (has no administration in time range)  morphine (PF) 4 MG/ML injection 4 mg (4 mg Intravenous Given 08/21/22 2023)  ondansetron (ZOFRAN) injection 4 mg (4 mg Intravenous Given 08/21/22 2023)  morphine (PF) 4 MG/ML injection 4 mg (4 mg Intravenous Given 08/21/22 2216)  ondansetron (ZOFRAN) injection 4 mg (4 mg Intravenous Given 08/21/22 2216)  potassium chloride 10 mEq in 100 mL IVPB (0 mEq Intravenous Stopped 08/22/22 0410)    Mobility walks with device     Focused Assessments Musculoskeletal   R Recommendations: See Admitting Provider Note  Report given to:   Additional Notes:  pt HOH

## 2022-08-22 NOTE — Anesthesia Procedure Notes (Signed)
Anesthesia Regional Block: Femoral nerve block   Pre-Anesthetic Checklist: , timeout performed,  Correct Patient, Correct Site, Correct Laterality,  Correct Procedure, Correct Position, site marked,  Risks and benefits discussed,  Surgical consent,  Pre-op evaluation,  At surgeon's request and post-op pain management  Laterality: Right  Prep: chloraprep       Needles:  Injection technique: Single-shot  Needle Type: Echogenic Needle     Needle Length: 5cm  Needle Gauge: 21     Additional Needles:   Narrative:  Start time: 08/22/2022 12:52 PM End time: 08/22/2022 12:56 PM Injection made incrementally with aspirations every 5 mL.  Performed by: Personally  Anesthesiologist: Audry Pili, MD  Additional Notes: No pain on injection. No increased resistance to injection. Injection made in 5cc increments. Good needle visualization. Patient tolerated the procedure well.

## 2022-08-22 NOTE — Progress Notes (Signed)
PROGRESS NOTE    Dana Morrison  [id number] DOB: 1923/07/30 DOA: 08/21/2022 PCP: Sueanne Margarita, DO    Brief Narrative:  89 year old ambulatory female from home with history of hypertension, coronary artery disease, paroxysmal A-fib on Eliquis, sick sinus syndrome status post pacemaker, nocturnal oxygen dependent presented with mechanical fall and right hip fracture.  Last dose of Eliquis was 2/13 morning.  In the emergency room hemodynamically stable.  CT scan right femur with nondisplaced transcervical right femoral neck fracture.  Admitted with orthopedic consultation.   Assessment & Plan:   Closed traumatic right hip fracture: Followed by orthopedics.  Is scheduled for surgery tomorrow 2/15 as per plan.  N.p.o. past midnight. Bedrest until surgery.  Adequate pain medications oral and IV opiates with muscle relaxants.  Bowel regimen.  SCDs until surgery.  Chronic medical issues including  paroxysmal A-fib: Continue metoprolol and Multaq.  Currently in sinus rhythm.  Holding Eliquis. Chronic diastolic heart failure: Appears compensated. Coronary artery disease, currently compensated.  Holding aspirin perioperative. Chronic bronchitis and nocturnal hypoxemia: On 2 L oxygen at night.  Continue bronchodilator as needed along with steroid inhalers. Electrolytes, potassium replaced.     DVT prophylaxis: SCDs Start: 08/21/22 2358   Code Status: Full code Family Communication: None at the bedside Disposition Plan: Status is: Inpatient Remains inpatient appropriate because: Inpatient surgery     Consultants:  Orthopedics  Procedures:  None  Antimicrobials:  None   Subjective: Patient seen in the morning rounds.  Right hip hurts while she is trying to move.  Denies any other symptoms.  Patient is quite independent and lives in senior living apartment and walks with cane and walker.  Objective: Vitals:   08/22/22 0900 08/22/22 0942 08/22/22 1000 08/22/22 1004  BP:  114/62 114/62 (!) 114/57   Pulse: 84 89 87   Resp: 20  (!) 21   Temp:    (!) 97.5 F (36.4 C)  TempSrc:    Oral  SpO2: 94%  90%   Weight:      Height:       No intake or output data in the 24 hours ending 08/22/22 1039 Filed Weights   08/21/22 1952  Weight: 87.5 kg    Examination:  General exam: Appears calm and comfortable  Alert oriented x 4. Respiratory system: Clear to auscultation. Respiratory effort normal.  On 2 L oxygen.  No added sounds. Cardiovascular system: S1 & S2 heard, RRR.  Pacemaker in place.   Gastrointestinal system: Abdomen is nondistended, soft and nontender. No organomegaly or masses felt. Normal bowel sounds heard. Central nervous system: Alert and oriented. No focal neurological deficits. Extremities: Not mobilizing right hip.  Otherwise equal power in all extremities.    Data Reviewed: I have personally reviewed following labs and imaging studies  CBC: Recent Labs  Lab 08/21/22 2021 08/22/22 0430  WBC 11.2* 7.5  NEUTROABS 9.0*  --   HGB 11.4* 10.4*  HCT 35.9* 33.2*  MCV 86.5 86.5  PLT 196 [id number]   Basic Metabolic Panel: Recent Labs  Lab 08/21/22 2021 08/22/22 0430  NA 141 139  K 3.4* 4.2  CL 106 105  CO2 25 25  GLUCOSE 104* 102*  BUN 31* 28*  CREATININE 1.00 0.92  CALCIUM 8.4* 8.0*  MG  --  2.1   GFR: Estimated Creatinine Clearance: 39.9 mL/min (by C-G formula based on SCr of 0.92 mg/dL). Liver Function Tests: No results for input(s): "AST", "ALT", "ALKPHOS", "BILITOT", "PROT", "ALBUMIN" in the last 168 hours. No results  for input(s): "LIPASE", "AMYLASE" in the last 168 hours. No results for input(s): "AMMONIA" in the last 168 hours. Coagulation Profile: No results for input(s): "INR", "PROTIME" in the last 168 hours. Cardiac Enzymes: No results for input(s): "CKTOTAL", "CKMB", "CKMBINDEX", "TROPONINI" in the last 168 hours. BNP (last 3 results) No results for input(s): "PROBNP" in the last 8760 hours. HbA1C: No results for  input(s): "HGBA1C" in the last 72 hours. CBG: No results for input(s): "GLUCAP" in the last 168 hours. Lipid Profile: No results for input(s): "CHOL", "HDL", "LDLCALC", "TRIG", "CHOLHDL", "LDLDIRECT" in the last 72 hours. Thyroid Function Tests: No results for input(s): "TSH", "T4TOTAL", "FREET4", "T3FREE", "THYROIDAB" in the last 72 hours. Anemia Panel: No results for input(s): "VITAMINB12", "FOLATE", "FERRITIN", "TIBC", "IRON", "RETICCTPCT" in the last 72 hours. Sepsis Labs: No results for input(s): "PROCALCITON", "LATICACIDVEN" in the last 168 hours.  No results found for this or any previous visit (from the past 240 hour(s)).       Radiology Studies: CT PELVIS WO CONTRAST  Result Date: 08/21/2022 CLINICAL DATA:  Hip trauma, fracture suspected, xray done. Right hip pain EXAM: CT PELVIS WITHOUT CONTRAST TECHNIQUE: Multidetector CT imaging of the pelvis was performed following the standard protocol without intravenous contrast. RADIATION DOSE REDUCTION: This exam was performed according to the departmental dose-optimization program which includes automated exposure control, adjustment of the mA and/or kV according to patient size and/or use of iterative reconstruction technique. COMPARISON:  None Available. FINDINGS: Urinary Tract:  No abnormality visualized. Bowel: Marked sigmoid diverticulosis. Visualized bowel loops are otherwise unremarkable. Vascular/Lymphatic: Extensive aortoiliac atherosclerotic calcification. No aortic aneurysm. No pathologic adenopathy. Reproductive: Gas within the uterine cavity is nonspecific and may relate to instrumentation though endometritis could appear similarly. The pelvic organs are otherwise unremarkable on this noncontrast examination. Other:  None Musculoskeletal: The osseous structures are diffusely osteopenic. There is abnormal angulation of the right femoral neck as well as a subtle sagittally oriented fracture plane best seen on coronal image # 69/9 in  keeping with a minimally angulated, nondisplaced transcervical femoral neck fracture. No dislocation. Remote healed fractures of the right superior and inferior pubic rami. Left hip bipolar hemiarthroplasty has been performed. Advanced degenerative changes are seen within the right hip. IMPRESSION: 1. Faintly visualized, minimally angulated, nondisplaced transcervical right femoral neck fracture. The fracture is poorly visualized, however, due to marked osteopenia. If indicated, this fracture could be confirmed with MRI examination. 2. Gas within the uterine cavity is nonspecific and may relate to instrumentation though endometritis could appear similarly. Correlation with clinical and laboratory examination may be helpful. 3. Marked sigmoid diverticulosis. 4. Extensive aortoiliac atherosclerotic calcification. Aortic Atherosclerosis (ICD10-I70.0). Electronically Signed   By: Fidela Salisbury M.D.   On: 08/21/2022 22:13   DG Chest Port 1 View  Result Date: 08/21/2022 CLINICAL DATA:  Hip pain after a fall. EXAM: PORTABLE CHEST 1 VIEW COMPARISON:  10/19/2019 FINDINGS: Cardiac pacemaker. Mild cardiac enlargement. Emphysematous changes in the lungs. Peribronchial thickening and bronchiectasis consistent with chronic bronchitis and unchanged. No airspace disease or consolidation. No pleural effusions. No pneumothorax. Mediastinal contours appear intact. Calcification of the aorta. Old appearing bilateral rib fractures. IMPRESSION: Mild cardiac enlargement. No evidence of active pulmonary disease. Emphysematous and chronic bronchitic changes in the lungs. Electronically Signed   By: Lucienne Capers M.D.   On: 08/21/2022 21:15   DG Hip Unilat W or Wo Pelvis 2-3 Views Right  Result Date: 08/21/2022 CLINICAL DATA:  Hip pain after a fall.  Trip and fall injury. EXAM:  DG HIP (WITH OR WITHOUT PELVIS) 2-3V RIGHT COMPARISON:  None Available. FINDINGS: Degenerative changes in the right hip with joint space narrowing and  sclerosis and prominent osteophyte formation on the femoral head. No evidence of acute fracture or dislocation of the hip. No focal bone lesions identified. Deformities demonstrated in the right superior and inferior pubic rami as well as in the right iliac bone. Age is indeterminate. Consider CT if clinical indications suggest acute pelvic fracture. Previous left hip arthroplasty. Degenerative changes in the lumbar spine with scoliosis convex towards the right. IMPRESSION: 1. Degenerative changes in the right hip. No acute displaced fractures identified. 2. Fracture deformities in the right hemipelvis including the iliac bone, superior and inferior pubic rami. Age is indeterminate and if acute fracture is suspected, consider CT. Electronically Signed   By: Lucienne Capers M.D.   On: 08/21/2022 21:14        Scheduled Meds:  dronedarone  400 mg Oral BID WC   isosorbide mononitrate  30 mg Oral Daily   metoprolol succinate  25 mg Oral Daily   mometasone-formoterol  2 puff Inhalation BID   pravastatin  20 mg Oral Daily   Continuous Infusions:  methocarbamol (ROBAXIN) IV       LOS: 1 day    Time spent: 35 minutes    Barb Merino, MD [facility] Pager [phone]

## 2022-08-22 NOTE — Consult Note (Signed)
Reason for Consult:Right hip fx Referring Physician: Barb Merino Time called: 0730 Time at bedside: 0907   Dana Morrison is an 89 y.o. female.  HPI: [name] tripped over her RW and fell at home. She had immediate right hip pain and could not get up. She was brought to the ED where x-rays showed a right hip fx and orthopedic surgery was consulted. She lives alone.  Past Medical History:  Diagnosis Date   Atrial fibrillation ([place])    Chronic diastolic heart failure ([place]) 09/12/2017   Congestive heart failure (CHF) ([facility])    estimated ejection fraction is [id number]; diastolic   Coronary artery disease    Decreased vision 09/12/2017   Hx of myocardial infarction 2010   Hypertension    Osteoarthritis of hip 09/12/2017   Osteoarthritis of knee 09/12/2017   Paroxysmal atrial fibrillation ([place]) 09/12/2017   Scalp psoriasis 09/12/2017   Seasonal allergic rhinitis due to pollen 09/12/2017   Urticaria 09/12/2017   Venous insufficiency 09/12/2017    Past Surgical History:  Procedure Laterality Date   APPENDECTOMY     ARM FRACTURE Left    FRACTURE SURGERY Bilateral    WRISTS   LAPAROSCOPIC LYSIS OF ADHESIONS     PACEMAKER INSERTION     PLACEMENT OF STENTS     X5   REPLACEMENT TOTAL KNEE     REVISION TOTAL HIP ARTHROPLASTY Left     Family History  Problem Relation Age of Onset   Microcephaly Mother    Heart attack Mother    Heart attack Father    Lung cancer Brother    Other Brother        BACK PROBLEMS   Healthy Daughter    Healthy Daughter    Healthy Daughter    Healthy Son     Social History:  reports that she quit smoking about 27 years ago. Her smoking use included cigarettes. She has a 3.75 pack-year smoking history. She has never used smokeless tobacco. She reports current alcohol use. She reports that she does not use drugs.  Allergies:  Allergies  Allergen Reactions   Psyllium Diarrhea   Gluten Meal     Per patient and her son, the need for a gluten free diet is due to some  intolerances that she has had for many years. It is not an allergy.    Other     Per patient she has an intolerance to some legumes. She does not have a true allergy. Pt's son confirmed this as well.     Medications: I have reviewed the patient's current medications.  Results for orders placed or performed during the hospital encounter of 08/21/22 (from the past 48 hour(s))  CBC with Differential     Status: Abnormal   Collection Time: 08/21/22  8:21 PM  Result Value Ref Range   WBC 11.2 (H) 4.0 - 10.5 K/uL   RBC 4.15 3.87 - 5.11 MIL/uL   Hemoglobin 11.4 (L) 12.0 - 15.0 g/dL   HCT 35.9 (L) 36.0 - 46.0 %   MCV 86.5 80.0 - 100.0 fL   MCH 27.5 26.0 - 34.0 pg   MCHC 31.8 30.0 - 36.0 g/dL   RDW 15.3 11.5 - 15.5 %   Platelets 196 150 - 400 K/uL   nRBC 0.0 0.0 - 0.2 %   Neutrophils Relative % 80 %   Neutro Abs 9.0 (H) 1.7 - 7.7 K/uL   Lymphocytes Relative 9 %   Lymphs Abs 1.0 0.7 - 4.0 K/uL  Monocytes Relative 10 %   Monocytes Absolute 1.1 (H) 0.1 - 1.0 K/uL   Eosinophils Relative 0 %   Eosinophils Absolute 0.0 0.0 - 0.5 K/uL   Basophils Relative 0 %   Basophils Absolute 0.0 0.0 - 0.1 K/uL   Immature Granulocytes 1 %   Abs Immature Granulocytes 0.06 0.00 - 0.07 K/uL    Comment: Performed at [facility] [address], [place], [place] [id number]  Basic metabolic panel     Status: Abnormal   Collection Time: 08/21/22  8:21 PM  Result Value Ref Range   Sodium 141 135 - 145 mmol/L   Potassium 3.4 (L) 3.5 - 5.1 mmol/L   Chloride 106 98 - 111 mmol/L   CO2 25 22 - 32 mmol/L   Glucose, Bld 104 (H) 70 - 99 mg/dL    Comment: Glucose reference range applies only to samples taken after fasting for at least 8 hours.   BUN 31 (H) 8 - 23 mg/dL   Creatinine, Ser 1.00 0.44 - 1.00 mg/dL   Calcium 8.4 (L) 8.9 - 10.3 mg/dL   GFR, Estimated 52 (L) >60 mL/min    Comment: (NOTE) Calculated using the CKD-EPI Creatinine Equation (2021)    Anion gap 10 5 - 15    Comment: Performed at [facility] [address], [place], [place] [address]  CBC     Status: Abnormal   Collection Time: 08/22/22  4:30 AM  Result Value Ref Range   WBC 7.5 4.0 - 10.5 K/uL   RBC 3.84 (L) 3.87 - 5.11 MIL/uL   Hemoglobin 10.4 (L) 12.0 - 15.0 g/dL   HCT 33.2 (L) 36.0 - 46.0 %   MCV 86.5 80.0 - 100.0 fL   MCH 27.1 26.0 - 34.0 pg   MCHC 31.3 30.0 - 36.0 g/dL   RDW 15.2 11.5 - 15.5 %   Platelets 170 150 - 400 K/uL   nRBC 0.0 0.0 - 0.2 %    Comment: Performed at [facility], [place] [address] [place], [place] [id number]  Basic metabolic panel     Status: Abnormal   Collection Time: 08/22/22  4:30 AM  Result Value Ref Range   Sodium 139 135 - 145 mmol/L   Potassium 4.2 3.5 - 5.1 mmol/L   Chloride 105 98 - 111 mmol/L   CO2 25 22 - 32 mmol/L   Glucose, Bld 102 (H) 70 - 99 mg/dL    Comment: Glucose reference range applies only to samples taken after fasting for at least 8 hours.   BUN 28 (H) 8 - 23 mg/dL   Creatinine, Ser 0.92 0.44 - 1.00 mg/dL   Calcium 8.0 (L) 8.9 - 10.3 mg/dL   GFR, Estimated 57 (L) >60 mL/min    Comment: (NOTE) Calculated using the CKD-EPI Creatinine Equation (2021)    Anion gap 9 5 - 15    Comment: Performed at [facility] [address], [place], [place] [address]  Magnesium     Status: None   Collection Time: 08/22/22  4:30 AM  Result Value Ref Range   Magnesium 2.1 1.7 - 2.4 mg/dL    Comment: Performed at [facility] [address], [place],  [address]    CT PELVIS WO CONTRAST  Result Date: 08/21/2022 CLINICAL DATA:  Hip trauma, fracture suspected, xray done. Right hip pain EXAM: CT PELVIS WITHOUT CONTRAST TECHNIQUE: Multidetector CT imaging of the pelvis was performed following the standard  protocol without intravenous contrast. RADIATION DOSE REDUCTION: This exam was performed according to the departmental dose-optimization program which includes automated exposure control, adjustment of the mA and/or kV according to patient size  and/or use of iterative reconstruction technique. COMPARISON:  None Available. FINDINGS: Urinary Tract:  No abnormality visualized. Bowel: Marked sigmoid diverticulosis. Visualized bowel loops are otherwise unremarkable. Vascular/Lymphatic: Extensive aortoiliac atherosclerotic calcification. No aortic aneurysm. No pathologic adenopathy. Reproductive: Gas within the uterine cavity is nonspecific and may relate to instrumentation though endometritis could appear similarly. The pelvic organs are otherwise unremarkable on this noncontrast examination. Other:  None Musculoskeletal: The osseous structures are diffusely osteopenic. There is abnormal angulation of the right femoral neck as well as a subtle sagittally oriented fracture plane best seen on coronal image # 69/9 in keeping with a minimally angulated, nondisplaced transcervical femoral neck fracture. No dislocation. Remote healed fractures of the right superior and inferior pubic rami. Left hip bipolar hemiarthroplasty has been performed. Advanced degenerative changes are seen within the right hip. IMPRESSION: 1. Faintly visualized, minimally angulated, nondisplaced transcervical right femoral neck fracture. The fracture is poorly visualized, however, due to marked osteopenia. If indicated, this fracture could be confirmed with MRI examination. 2. Gas within the uterine cavity is nonspecific and may relate to instrumentation though endometritis could appear similarly. Correlation with clinical and laboratory examination may be helpful. 3. Marked sigmoid diverticulosis. 4. Extensive aortoiliac atherosclerotic calcification. Aortic Atherosclerosis (ICD10-I70.0). Electronically Signed   By: Fidela Salisbury M.D.   On: 08/21/2022 22:13   DG Chest Port 1 View  Result Date: 08/21/2022 CLINICAL DATA:  Hip pain after a fall. EXAM: PORTABLE CHEST 1 VIEW COMPARISON:  10/19/2019 FINDINGS: Cardiac pacemaker. Mild cardiac enlargement. Emphysematous changes in the lungs.  Peribronchial thickening and bronchiectasis consistent with chronic bronchitis and unchanged. No airspace disease or consolidation. No pleural effusions. No pneumothorax. Mediastinal contours appear intact. Calcification of the aorta. Old appearing bilateral rib fractures. IMPRESSION: Mild cardiac enlargement. No evidence of active pulmonary disease. Emphysematous and chronic bronchitic changes in the lungs. Electronically Signed   By: Lucienne Capers M.D.   On: 08/21/2022 21:15   DG Hip Unilat W or Wo Pelvis 2-3 Views Right  Result Date: 08/21/2022 CLINICAL DATA:  Hip pain after a fall.  Trip and fall injury. EXAM: DG HIP (WITH OR WITHOUT PELVIS) 2-3V RIGHT COMPARISON:  None Available. FINDINGS: Degenerative changes in the right hip with joint space narrowing and sclerosis and prominent osteophyte formation on the femoral head. No evidence of acute fracture or dislocation of the hip. No focal bone lesions identified. Deformities demonstrated in the right superior and inferior pubic rami as well as in the right iliac bone. Age is indeterminate. Consider CT if clinical indications suggest acute pelvic fracture. Previous left hip arthroplasty. Degenerative changes in the lumbar spine with scoliosis convex towards the right. IMPRESSION: 1. Degenerative changes in the right hip. No acute displaced fractures identified. 2. Fracture deformities in the right hemipelvis including the iliac bone, superior and inferior pubic rami. Age is indeterminate and if acute fracture is suspected, consider CT. Electronically Signed   By: Lucienne Capers M.D.   On: 08/21/2022 21:14    Review of Systems  HENT:  Negative for ear discharge, ear pain, hearing loss and tinnitus.   Eyes:  Negative for photophobia and pain.  Respiratory:  Negative for cough and shortness of breath.   Cardiovascular:  Negative for chest pain.  Gastrointestinal:  Negative for abdominal pain, nausea and vomiting.  Genitourinary:  Negative for  dysuria, flank pain, frequency and urgency.  Musculoskeletal:  Positive for arthralgias (Right hip). Negative for back pain, myalgias and neck pain.  Neurological:  Negative for dizziness and headaches.  Hematological:  Does not bruise/bleed easily.  Psychiatric/Behavioral:  The patient is not nervous/anxious.    Blood pressure 123/62, pulse 90, temperature 97.8 F (36.6 C), temperature source Oral, resp. rate (!) 27, height 5' 6"$  (1.676 m), weight 87.5 kg, SpO2 92 %. Physical Exam Constitutional:      General: She is not in acute distress.    Appearance: She is well-developed. She is not diaphoretic.  HENT:     Head: Normocephalic and atraumatic.  Eyes:     General: No scleral icterus.       Right eye: No discharge.        Left eye: No discharge.     Conjunctiva/sclera: Conjunctivae normal.  Cardiovascular:     Rate and Rhythm: Normal rate and regular rhythm.  Pulmonary:     Effort: Pulmonary effort is normal. No respiratory distress.  Musculoskeletal:     Cervical back: Normal range of motion.     Comments: RLE No traumatic wounds, ecchymosis, or rash  Mod TTP hip  No knee or ankle effusion  Knee stable to varus/ valgus and anterior/posterior stress  Sens DPN, SPN, TN intact  Motor EHL, ext, flex, evers 5/5  DP 2+, PT 2+, No significant edema  Skin:    General: Skin is warm and dry.  Neurological:     Mental Status: She is alert.  Psychiatric:        Mood and Affect: Mood normal.        Behavior: Behavior normal.     Assessment/Plan: Right hip fx -- Plan cannulated hip pinning tomorrow with Dr. Marcelino Scot. Please keep NPO after MN. Multiple medical problems including hypertension, CAD, chronic HFpEF, paroxysmal atrial fibrillation on Eliquis, and sick sinus syndrome with pacer -- per primary service. Hold Eliquis.    Lisette Abu, PA-C Orthopedic Surgery [phone] 08/22/2022, 9:10 AM

## 2022-08-23 ENCOUNTER — Encounter (HOSPITAL_COMMUNITY)
Admission: EM | Disposition: A | Discharge status: Skilled Nursing Facility | Payer: Self-pay | Source: Skilled Nursing Facility | Attending: Internal Medicine

## 2022-08-23 ENCOUNTER — Other Ambulatory Visit: Payer: Self-pay

## 2022-08-23 ENCOUNTER — Inpatient Hospital Stay (HOSPITAL_COMMUNITY): Payer: Medicare HMO | Admitting: Anesthesiology

## 2022-08-23 ENCOUNTER — Encounter (HOSPITAL_COMMUNITY): Payer: Self-pay | Admitting: Family Medicine

## 2022-08-23 ENCOUNTER — Inpatient Hospital Stay (HOSPITAL_COMMUNITY): Payer: Medicare HMO

## 2022-08-23 DIAGNOSIS — Z87891 Personal history of nicotine dependence: Secondary | ICD-10-CM | POA: Diagnosis not present

## 2022-08-23 DIAGNOSIS — I2510 Atherosclerotic heart disease of native coronary artery without angina pectoris: Secondary | ICD-10-CM

## 2022-08-23 DIAGNOSIS — I10 Essential (primary) hypertension: Secondary | ICD-10-CM | POA: Diagnosis not present

## 2022-08-23 DIAGNOSIS — S72001A Fracture of unspecified part of neck of right femur, initial encounter for closed fracture: Secondary | ICD-10-CM | POA: Diagnosis not present

## 2022-08-23 HISTORY — PX: HIP PINNING,CANNULATED: SHX1758

## 2022-08-23 LAB — BASIC METABOLIC PANEL
Anion gap: 6 (ref 5–15)
BUN: 23 mg/dL (ref 8–23)
CO2: 26 mmol/L (ref 22–32)
Calcium: 8 mg/dL — ABNORMAL LOW (ref 8.9–10.3)
Chloride: 105 mmol/L (ref 98–111)
Creatinine, Ser: 0.87 mg/dL (ref 0.44–1.00)
GFR, Estimated: >60 mL/min (ref >60)
Glucose, Bld: 114 mg/dL — ABNORMAL HIGH (ref 70–99)
Potassium: 4.2 mmol/L (ref 3.5–5.1)
Sodium: 137 mmol/L (ref 135–145)

## 2022-08-23 LAB — CBC
HCT: 32.3 % — ABNORMAL LOW (ref 36.0–46.0)
Hemoglobin: 10.3 g/dL — ABNORMAL LOW (ref 12.0–15.0)
MCH: 27.6 pg (ref 26.0–34.0)
MCHC: 31.9 g/dL (ref 30.0–36.0)
MCV: 86.6 fL (ref 80.0–100.0)
Platelets: 148 10*3/uL — ABNORMAL LOW (ref 150–400)
RBC: 3.73 MIL/uL — ABNORMAL LOW (ref 3.87–5.11)
RDW: 15.2 % (ref 11.5–15.5)
WBC: 7.2 10*3/uL (ref 4.0–10.5)
nRBC: 0 % (ref 0.0–0.2)

## 2022-08-23 LAB — GLUCOSE, CAPILLARY: Glucose-Capillary: 145 mg/dL — ABNORMAL HIGH (ref 70–99)

## 2022-08-23 LAB — SURGICAL PCR SCREEN
MRSA, PCR: NEGATIVE
Staphylococcus aureus: NEGATIVE

## 2022-08-23 SURGERY — FIXATION, FEMUR, NECK, PERCUTANEOUS, USING SCREW
Anesthesia: General | Site: Hip | Laterality: Right

## 2022-08-23 MED ORDER — PHENYLEPHRINE 80 MCG/ML (10ML) SYRINGE FOR IV PUSH (FOR BLOOD PRESSURE SUPPORT)
PREFILLED_SYRINGE | INTRAVENOUS | Status: DC | PRN
  Administered 2022-08-23 (×3): 80 ug via INTRAVENOUS

## 2022-08-23 MED ORDER — SUGAMMADEX SODIUM 200 MG/2ML IV SOLN
INTRAVENOUS | Status: DC | PRN
  Administered 2022-08-23: 200 mg via INTRAVENOUS

## 2022-08-23 MED ORDER — DEXAMETHASONE SODIUM PHOSPHATE 10 MG/ML IJ SOLN
INTRAMUSCULAR | Status: DC | PRN
  Administered 2022-08-23: 4 mg via INTRAVENOUS

## 2022-08-23 MED ORDER — FENTANYL CITRATE (PF) 100 MCG/2ML IJ SOLN: 50.0000 ug | Freq: Once | INTRAMUSCULAR | Status: DC

## 2022-08-23 MED ORDER — ACETAMINOPHEN 10 MG/ML IV SOLN
1000.0000 mg | Freq: Once | INTRAVENOUS | Status: DC | PRN
  Administered 2022-08-23: 1000 mg via INTRAVENOUS

## 2022-08-23 MED ORDER — ADULT MULTIVITAMIN W/MINERALS CH
1.0000 | ORAL_TABLET | Freq: Every day | ORAL | Status: DC
  Administered 2022-08-23 – 2022-08-29 (×7): 1 via ORAL
  Filled 2022-08-23 (×7): qty 1

## 2022-08-23 MED ORDER — FENTANYL CITRATE (PF) 250 MCG/5ML IJ SOLN
INTRAMUSCULAR | Status: AC
  Filled 2022-08-23: qty 5

## 2022-08-23 MED ORDER — ORAL CARE MOUTH RINSE: 15.0000 mL | Freq: Once | OROMUCOSAL | Status: AC

## 2022-08-23 MED ORDER — ONDANSETRON HCL 4 MG/2ML IJ SOLN: 4.0000 mg | Freq: Once | INTRAMUSCULAR | Status: DC | PRN

## 2022-08-23 MED ORDER — PANTOPRAZOLE SODIUM 40 MG PO TBEC
40.0000 mg | DELAYED_RELEASE_TABLET | Freq: Every day | ORAL | Status: DC
  Administered 2022-08-23 – 2022-08-29 (×7): 40 mg via ORAL
  Filled 2022-08-23 (×7): qty 1

## 2022-08-23 MED ORDER — CHLORHEXIDINE GLUCONATE 0.12 % MT SOLN
15.0000 mL | Freq: Once | OROMUCOSAL | Status: AC
  Administered 2022-08-23: 15 mL via OROMUCOSAL
  Filled 2022-08-23: qty 15

## 2022-08-23 MED ORDER — FENTANYL CITRATE (PF) 100 MCG/2ML IJ SOLN
25.0000 ug | INTRAMUSCULAR | Status: DC | PRN
  Administered 2022-08-23: 50 ug via INTRAVENOUS

## 2022-08-23 MED ORDER — ENSURE ENLIVE PO LIQD
237.0000 mL | Freq: Two times a day (BID) | ORAL | Status: DC
  Administered 2022-08-23 – 2022-08-29 (×13): 237 mL via ORAL

## 2022-08-23 MED ORDER — ACETAMINOPHEN 325 MG PO TABS
650.0000 mg | ORAL_TABLET | Freq: Three times a day (TID) | ORAL | Status: DC
  Administered 2022-08-23 – 2022-08-24 (×3): 650 mg via ORAL
  Filled 2022-08-23 (×3): qty 2

## 2022-08-23 MED ORDER — PROPOFOL 10 MG/ML IV BOLUS
INTRAVENOUS | Status: AC
  Filled 2022-08-23: qty 20

## 2022-08-23 MED ORDER — ONDANSETRON HCL 4 MG/2ML IJ SOLN
INTRAMUSCULAR | Status: DC | PRN
  Administered 2022-08-23: 4 mg via INTRAVENOUS

## 2022-08-23 MED ORDER — CEFAZOLIN SODIUM-DEXTROSE 2-3 GM-%(50ML) IV SOLR
INTRAVENOUS | Status: DC | PRN
  Administered 2022-08-23: 2 g via INTRAVENOUS

## 2022-08-23 MED ORDER — LIDOCAINE 2% (20 MG/ML) 5 ML SYRINGE
INTRAMUSCULAR | Status: DC | PRN
  Administered 2022-08-23: 80 mg via INTRAVENOUS

## 2022-08-23 MED ORDER — CEFAZOLIN SODIUM-DEXTROSE 2-4 GM/100ML-% IV SOLN
2.0000 g | Freq: Three times a day (TID) | INTRAVENOUS | Status: AC
  Administered 2022-08-23 – 2022-08-24 (×3): 2 g via INTRAVENOUS
  Filled 2022-08-23 (×3): qty 100

## 2022-08-23 MED ORDER — ROCURONIUM BROMIDE 10 MG/ML (PF) SYRINGE
PREFILLED_SYRINGE | INTRAVENOUS | Status: DC | PRN
  Administered 2022-08-23: 40 mg via INTRAVENOUS
  Administered 2022-08-23: 20 mg via INTRAVENOUS

## 2022-08-23 MED ORDER — 0.9 % SODIUM CHLORIDE (POUR BTL) OPTIME
TOPICAL | Status: DC | PRN
  Administered 2022-08-23: 1000 mL

## 2022-08-23 MED ORDER — ACETAMINOPHEN 10 MG/ML IV SOLN
INTRAVENOUS | Status: AC
  Filled 2022-08-23: qty 100

## 2022-08-23 MED ORDER — LACTATED RINGERS IV SOLN: INTRAVENOUS | Status: DC

## 2022-08-23 MED ORDER — HYDROCODONE-ACETAMINOPHEN 5-325 MG PO TABS
1.0000 | ORAL_TABLET | Freq: Four times a day (QID) | ORAL | Status: DC | PRN
  Administered 2022-08-23 – 2022-08-24 (×3): 1 via ORAL
  Filled 2022-08-23 (×3): qty 1

## 2022-08-23 MED ORDER — PROPOFOL 10 MG/ML IV BOLUS
INTRAVENOUS | Status: DC | PRN
  Administered 2022-08-23: 80 mg via INTRAVENOUS

## 2022-08-23 MED ORDER — FENTANYL CITRATE (PF) 250 MCG/5ML IJ SOLN
INTRAMUSCULAR | Status: DC | PRN
  Administered 2022-08-23 (×3): 25 ug via INTRAVENOUS

## 2022-08-23 MED ORDER — FENTANYL CITRATE (PF) 100 MCG/2ML IJ SOLN
INTRAMUSCULAR | Status: AC
  Filled 2022-08-23: qty 2

## 2022-08-23 MED ORDER — PHENYLEPHRINE HCL-NACL 20-0.9 MG/250ML-% IV SOLN
INTRAVENOUS | Status: DC | PRN
  Administered 2022-08-23: 30 ug/min via INTRAVENOUS

## 2022-08-23 SURGICAL SUPPLY — 44 items
BAG COUNTER SPONGE SURGICOUNT (BAG) ×1 IMPLANT
BIT DRILL 4.8X200 CANN (BIT) IMPLANT
BRUSH SCRUB EZ PLAIN DRY (MISCELLANEOUS) ×2 IMPLANT
COVER PERINEAL POST (MISCELLANEOUS) ×1 IMPLANT
COVER SURGICAL LIGHT HANDLE (MISCELLANEOUS) ×2 IMPLANT
DRAPE C-ARMOR (DRAPES) ×1 IMPLANT
DRAPE STERI IOBAN 125X83 (DRAPES) ×1 IMPLANT
DRESSING MEPILEX FLEX 4X4 (GAUZE/BANDAGES/DRESSINGS) IMPLANT
DRSG MEPILEX BORDER 4X4 (GAUZE/BANDAGES/DRESSINGS) ×1 IMPLANT
DRSG MEPILEX FLEX 4X4 (GAUZE/BANDAGES/DRESSINGS) ×1
ELECT REM PT RETURN 9FT ADLT (ELECTROSURGICAL) ×1
ELECTRODE REM PT RTRN 9FT ADLT (ELECTROSURGICAL) ×1 IMPLANT
GLOVE BIO SURGEON STRL SZ7.5 (GLOVE) ×1 IMPLANT
GLOVE BIO SURGEON STRL SZ8 (GLOVE) ×1 IMPLANT
GLOVE BIOGEL PI IND STRL 7.5 (GLOVE) ×1 IMPLANT
GLOVE BIOGEL PI IND STRL 8 (GLOVE) ×1 IMPLANT
GLOVE SURG ORTHO LTX SZ7.5 (GLOVE) ×2 IMPLANT
GOWN STRL REUS W/ TWL LRG LVL3 (GOWN DISPOSABLE) ×2 IMPLANT
GOWN STRL REUS W/ TWL XL LVL3 (GOWN DISPOSABLE) ×1 IMPLANT
GOWN STRL REUS W/TWL LRG LVL3 (GOWN DISPOSABLE) ×2
GOWN STRL REUS W/TWL XL LVL3 (GOWN DISPOSABLE) ×1
KIT BASIN OR (CUSTOM PROCEDURE TRAY) ×1 IMPLANT
KIT TURNOVER KIT B (KITS) ×1 IMPLANT
MANIFOLD NEPTUNE II (INSTRUMENTS) ×1 IMPLANT
NS IRRIG 1000ML POUR BTL (IV SOLUTION) ×1 IMPLANT
PACK GENERAL/GYN (CUSTOM PROCEDURE TRAY) ×1 IMPLANT
PACK UNIVERSAL I (CUSTOM PROCEDURE TRAY) IMPLANT
PAD ARMBOARD 7.5X6 YLW CONV (MISCELLANEOUS) ×2 IMPLANT
PIN GUIDE DRIL TIP 2.8X300 STE (PIN) IMPLANT
PIN GUIDE DRILL TIP 2.8X300 (DRILL) IMPLANT
SCREW CANN 8.0X85 HIP (Screw) IMPLANT
SCREW CANN FT 95X8 NS LNG (Screw) IMPLANT
SCREW CANNULATED 8.0X95 (Screw) ×1 IMPLANT
STAPLER VISISTAT 35W (STAPLE) ×1 IMPLANT
SUT ETHILON 2 0 FS 18 (SUTURE) IMPLANT
SUT VIC AB 0 CT1 27 (SUTURE) ×1
SUT VIC AB 0 CT1 27XBRD ANBCTR (SUTURE) ×1 IMPLANT
SUT VIC AB 1 CT1 27 (SUTURE) ×1
SUT VIC AB 1 CT1 27XBRD ANBCTR (SUTURE) ×1 IMPLANT
SUT VIC AB 2-0 CT1 27 (SUTURE) ×1
SUT VIC AB 2-0 CT1 TAPERPNT 27 (SUTURE) ×1 IMPLANT
TOWEL GREEN STERILE (TOWEL DISPOSABLE) ×2 IMPLANT
TOWEL GREEN STERILE FF (TOWEL DISPOSABLE) ×1 IMPLANT
WATER STERILE IRR 1000ML POUR (IV SOLUTION) ×1 IMPLANT

## 2022-08-23 NOTE — Telephone Encounter (Signed)
Spk with son Marden Noble states pt is currently in hospital she fell and broke her hip and had surgery. He is agreeable to cpap

## 2022-08-23 NOTE — Progress Notes (Signed)
The risks and benefits of surgery for right hip repair were discussed with the patient, including the possibility of infection, nerve injury, vessel injury, wound breakdown, arthritis, symptomatic hardware, DVT/ PE, loss of motion, malunion, nonunion, and need for further surgery among others.  These risks were acknowledged and consent provided to proceed.  Dana Riverside, MD [facility], [facility] [phone]

## 2022-08-23 NOTE — Anesthesia Postprocedure Evaluation (Signed)
Anesthesia Post Note  Patient: Anita Balbo  Procedure(s) Performed: CANNULATED HIP PINNING (Right: Hip)     Patient location during evaluation: PACU Anesthesia Type: General Level of consciousness: awake and alert Pain management: pain level controlled Vital Signs Assessment: post-procedure vital signs reviewed and stable Respiratory status: spontaneous breathing, nonlabored ventilation, respiratory function stable and patient connected to nasal cannula oxygen Cardiovascular status: blood pressure returned to baseline and stable Postop Assessment: no apparent nausea or vomiting Anesthetic complications: no   No notable events documented.  Last Vitals:  Vitals:   08/23/22 1045 08/23/22 1102  BP: (!) 118/54 (!) 113/54  Pulse: 70 72  Resp: 16 16  Temp:  36.8 C  SpO2: 95% 95%    Last Pain:  Vitals:   08/23/22 1102  TempSrc: Oral  PainSc:                  Barnet Glasgow

## 2022-08-23 NOTE — Progress Notes (Signed)
Initial Nutrition Assessment  DOCUMENTATION CODES:   Not applicable  INTERVENTION:  Encourage adequate PO intake with emphasis on protein to support post-op healing Ensure Enlive po BID, each supplement provides 350 kcal and 20 grams of protein. Magic cup TID with meals, each supplement provides 290 kcal and 9 grams of protein MVI with minerals daily  NUTRITION DIAGNOSIS:   Increased nutrient needs related to hip fracture, post-op healing as evidenced by estimated needs.  GOAL:   Patient will meet greater than or equal to 90% of their needs  MONITOR:   PO intake, Supplement acceptance, Labs, Weight trends, Skin  REASON FOR ASSESSMENT:   Consult Hip fracture protocol  ASSESSMENT:   Pt admitted d/t mechanical fall leading to R hip fracture. PMH significant for Htn, CAD, paroxysmal afib, SSS s/p PPM.  2/15: IMN R hip today; SLP eval-recommend regular diet, thin liquids  Attempted x2 to obtain nutrition assessment. Pt off unit for hip repair and then xray.   No documented meal completions on file to review at this time.  Reviewed weight history. Pt's weight noted to have declined form 83.5 kg to 81.6 kg between 12/15/21-08/03/22 which is not clinically significant for time frame.  Current weight noted to be 87.5 kg.   Medications: imodium, protonix, IV abx  Labs reviewed   NUTRITION - FOCUSED PHYSICAL EXAM: Pt off unit, deferred to follow up.   Diet Order:   Diet Order             Diet regular Fluid consistency: Thin  Diet effective now                   EDUCATION NEEDS:   No education needs have been identified at this time  Skin:  Skin Assessment: Reviewed RN Assessment (R hip incision (closed))  Last BM:  unknown  Height:   Ht Readings from Last 1 Encounters:  08/22/22 5' 5.98" (1.676 m)    Weight:   Wt Readings from Last 1 Encounters:  08/22/22 87.5 kg    Ideal Body Weight:  59.1 kg  BMI:  Body mass index is 31.15 kg/m.  Estimated  Nutritional Needs:   Kcal:  1400-1600  Protein:  75-90g  Fluid:  1.4-1.6L  Clayborne Dana, RDN, LDN Clinical Nutrition

## 2022-08-23 NOTE — Anesthesia Procedure Notes (Signed)
Procedure Name: Intubation Date/Time: 08/23/2022 8:33 AM  Performed by: Wilburn Cornelia, CRNAPre-anesthesia Checklist: Patient identified, Emergency Drugs available, Suction available, Patient being monitored and Timeout performed Patient Re-evaluated:Patient Re-evaluated prior to induction Oxygen Delivery Method: Circle system utilized Preoxygenation: Pre-oxygenation with 100% oxygen Induction Type: IV induction Ventilation: Mask ventilation without difficulty Laryngoscope Size: Mac and 3 Grade View: Grade II Tube type: Oral Tube size: 7.0 mm Airway Equipment and Method: Stylet Placement Confirmation: ETT inserted through vocal cords under direct vision, positive ETCO2, CO2 detector and breath sounds checked- equal and bilateral Secured at: 21 cm Tube secured with: Tape Dental Injury: Teeth and Oropharynx as per pre-operative assessment

## 2022-08-23 NOTE — Op Note (Signed)
OPERATIVE REPORT 08/23/2022  10:10 AM  PATIENT:  Dana Morrison  89 y.o. female  PRE-OPERATIVE DIAGNOSIS: IMPACTED RIGHT FEMORAL NECK FRACTURE    POST-OPERATIVE DIAGNOSIS:  IMPACTED RIGHT FEMORAL NECK FRACTURE  PROCEDURE: CANNULATED SCREW FIXATION OF RIGHT FEMORAL NECK FRACTURE WITH 8.0 MM BIOMET CANNULATED SCREWS WITH STAR DRIVE HEADS  SURGEON:  Surgeon(s) and Role:    * Altamese Monongahela, MD - Primary  PHYSICIAN ASSISTANT: PA Student  ANESTHESIA:   general  I/O:  Total I/O In: 500 [I.V.:500] Out: -   SPECIMEN:  No Specimen  TOURNIQUET: NONE  COMPLICATIONS: NONE  DICTATION: Note written in EPIC  DISPOSITION: TO PACU  CONDITION: STABLE  DELAY START OF DVT PROPHYLAXIS BECAUSE OF BLEEDING RISK: NO   BRIEF SUMMARY OF INDICATIONS FOR PROCEDURE:  Dana Morrison is a very pleasant 89 y.o. who sustained a ground level fall resulting in hip pain, inability to bear weight. Subsequent x-rays demonstrated a comminuted valgus impacted femoral neck fracture.  We did discuss the risks and benefits of surgical fixation including the possibility of avascular necrosis, nonunion, malunion, loss of fixation, need for conversion to total hip arthroplasty or other surgery, DVT, PE, heart attack, stroke, loss of motion, and multiple others including infection, and symptomatic hardware.  After full discussion of these risks and others, the patient wished to proceed.  DESCRIPTION OF PROCEDURE:  The patient was taken to the operating room where general anesthesia was induced.  Preoperative antibiotics consisting of Ancef were administered. The patient was very carefully positioned on the radiolucent table with a bump under the pelvis on the side of the fracture. C-arm was brought in to confirm appropriate images and reduction. I also carefully re-x-rayed on both AP and LAT the right femur and knee because of bruising there. Standard prep and drape were then  performed using chlorhexidine scrub, followed by Betadine scrub and paint.  C-arm was brought in to confirm the appropriate starting position for a 4 cm incision and checked on both AP and lateral planes. The incision was made.  Dissection carried carefully down to the tensor Which was split in line to expose the vastus lateralis.  The guide pin for these screws was then placed inferior and central and advanced along the calcar into the femoral head checking it on 2 views. Two additional guide pins were then placed superior to this, one superior anterior and the other superior posterior. Began with the inferior screw, drilling the lateral cortex, then advancing the threads across the fracture site and engaging it, checking under C-arm for compression.  Additionally 2 screws were placed proximally achieving excellent fixation with inverted triangle pattern across the femoral neck into the head.  There was solid bite in the femoral head as well.  Wound was irrigated thoroughly.  I did split and spread the tensor and was able to repair this back with a simple 0 Vicryl sutures.  The deep subcu was repaired with #1 Vicryl and then a 2-0 Vicryl and a 3-0 nylon horizontal mattress for the skin layer.  Sterile gently compressive dressing was applied.  The patient was awakened from anesthesia and transported to the PACU in stable condition.  An assistant helped throughout with retraction for exposure and instrumentation, as well as for closure.   PROGNOSIS:  Patient will be weightbearing as tolerated on the right lower extremity with walker and will be allowed to proceed with discharge to home as soon cleared by PT.  Anticipate being on DVT prophylaxis with Lovenox, and bone healing  in about 6 weeks.  Would anticipate some form of stationary bicycle use within a couple of weeks.  She is at risk for nonunion and avascular necrosis with the potential for subsequent total hip arthroplasty  conversion.     Astrid Divine. Marcelino Scot, M.D.

## 2022-08-23 NOTE — Evaluation (Signed)
Clinical/Bedside Swallow Evaluation Patient Details  Name: Dana Morrison MRN: [id number] Date of Birth: 1923/09/10  Today's Date: 08/23/2022 Time: SLP Start Time (ACUTE ONLY): 28 SLP Stop Time (ACUTE ONLY): 1222 SLP Time Calculation (min) (ACUTE ONLY): 7 min  Past Medical History:  Past Medical History:  Diagnosis Date   Atrial fibrillation ([place])    Chronic diastolic heart failure ([place]) 09/12/2017   Congestive heart failure (CHF) (H[facility])    estimated ejection fraction is [id number]; diastolic   Coronary artery disease    Decreased vision 09/12/2017   Hx of myocardial infarction 2010   Hypertension    Osteoarthritis of hip 09/12/2017   Osteoarthritis of knee 09/12/2017   Paroxysmal atrial fibrillation ([place]) 09/12/2017   Scalp psoriasis 09/12/2017   Seasonal allergic rhinitis due to pollen 09/12/2017   Urticaria 09/12/2017   Venous insufficiency 09/12/2017   Past Surgical History:  Past Surgical History:  Procedure Laterality Date   APPENDECTOMY     ARM FRACTURE Left    FRACTURE SURGERY Bilateral    WRISTS   LAPAROSCOPIC LYSIS OF ADHESIONS     PACEMAKER INSERTION     PLACEMENT OF STENTS     X5   REPLACEMENT TOTAL KNEE     REVISION TOTAL HIP ARTHROPLASTY Left    HPI:  Dana Morrison is a 89 y.o. female who presented to the emergency department with right hip pain after fall. Found to have R hip fx and is now s/p surgery. CXR 2/13: "No evidence of active pulmonary disease.  Emphysematous and chronic bronchitic changes in the lungs."  Pt with with medical history significant for hypertension, CAD, chronic HFpEF, paroxysmal atrial fibrillation on Eliquis, and sick sinus syndrome with pacer, and chronic cough.    Assessment / Plan / Recommendation  Clinical Impression  Pt presents with grossly functional swallowing.  Pt with chronic cough which she reported at admission was unchanged. Pt coughing prior to administration of POs.  There was no change in vocal quality or preceptual cough characteristics  with POs, but coughing and throat clear persisted throughout. Pt with clear chest xray on admission.  Peformance today appears to be consistent with her baseline.  Pt would like to resume regular texture diet.  Pt has no further ST needs.  SLP will sign off.  Please reconsult if there is a change in functional status.    Recommend regular texture diet with thin liquids.  SLP Visit Diagnosis: Dysphagia, unspecified (R13.10)    Aspiration Risk  No limitations    Diet Recommendation Regular;Thin liquid   Liquid Administration via: Cup;Straw Medication Administration: Whole meds with liquid Supervision: Patient able to self feed Compensations: Slow rate;Small sips/bites Postural Changes: Seated upright at 90 degrees    Other  Recommendations Oral Care Recommendations: Oral care BID    Recommendations for follow up therapy are one component of a multi-disciplinary discharge planning process, led by the attending physician.  Recommendations may be updated based on patient status, additional functional criteria and insurance authorization.  Follow up Recommendations No SLP follow up      Assistance Recommended at Discharge  N/A  Functional Status Assessment Patient has not had a recent decline in their functional status  Frequency and Duration  (N/A)          Prognosis Prognosis for improved oropharyngeal function:  (N/A)      Swallow Study   General Date of Onset: 08/21/22 HPI: Dana Morrison is a 89 y.o. female who presented to the emergency department with right hip  pain after fall. Found to have R hip fx and is now s/p surgery. CXR 2/13: "No evidence of active pulmonary disease.  Emphysematous and chronic bronchitic changes in the lungs."  Pt with with medical history significant for hypertension, CAD, chronic HFpEF, paroxysmal atrial fibrillation on Eliquis, and sick sinus syndrome with pacer, and chronic cough. Type of Study: Bedside Swallow Evaluation Previous Swallow  Assessment: None Diet Prior to this Study: Dysphagia 3 (mechanical soft);Thin liquids (Level 0) Temperature Spikes Noted: No Respiratory Status: Room air Behavior/Cognition: Alert;Cooperative;Pleasant mood Oral Cavity Assessment: Within Functional Limits Oral Care Completed by SLP: No Oral Cavity - Dentition: Missing dentition (upper partial not in place) Vision: Functional for self-feeding Self-Feeding Abilities: Able to feed self Patient Positioning: Upright in bed Baseline Vocal Quality: Normal Volitional Cough: Strong Volitional Swallow: Able to elicit    Oral/Motor/Sensory Function Overall Oral Motor/Sensory Function: Within functional limits Facial ROM: Within Functional Limits Facial Symmetry: Within Functional Limits Lingual ROM: Within Functional Limits Lingual Symmetry: Within Functional Limits Lingual Strength: Within Functional Limits Velum: Within Functional Limits Mandible: Within Functional Limits   Ice Chips Ice chips: Not tested   Thin Liquid Thin Liquid: Within functional limits    Nectar Thick Nectar Thick Liquid: Not tested   Honey Thick Honey Thick Liquid: Not tested   Puree Puree: Within functional limits Presentation: Spoon   Solid     Solid: Within functional limits Presentation: [place], [place], [place] Office: [phone] 08/23/2022,12:37 PM

## 2022-08-23 NOTE — TOC CAGE-AID Note (Signed)
Transition of Care [facility]) - CAGE-AID Screening   Patient Details  Name: Teianna Pressly MRN: [id number] Date of Birth: 1924/04/06  Transition of Care (TOC) CM/SW Contact:    Army Melia, RN Phone Number:[phone] 08/23/2022, 4:00 AM    CAGE-AID Screening:    Have You Ever Felt You Ought to Cut Down on Your Drinking or Drug Use?: No Have People Annoyed You By Critizing Your Drinking Or Drug Use?: No Have You Felt Bad Or Guilty About Your Drinking Or Drug Use?: No Have You Ever Had a Drink or Used Drugs First Thing In The Morning to Steady Your Nerves or to Get Rid of a Hangover?: No CAGE-AID Score: 0  Substance Abuse Education Offered: No

## 2022-08-23 NOTE — Progress Notes (Signed)
PROGRESS NOTE    Dana Morrison  [id number] DOB: [birth year] DOA: 08/21/2022 PCP: Sueanne Margarita, DO    Brief Narrative:  89 year old ambulatory female from home with history of hypertension, coronary artery disease, paroxysmal A-fib on Eliquis, sick sinus syndrome status post pacemaker, nocturnal oxygen dependent presented with mechanical fall and right hip fracture.  Last dose of Eliquis was 2/13 morning.  In the emergency room hemodynamically stable.  CT scan right femur with nondisplaced transcervical right femoral neck fracture.  Admitted with orthopedic consultation.   Assessment & Plan:   Closed traumatic right hip fracture: Status post ORIF with IM nail today, Dr. Duard Larsen.   Adequate pain medication, oral and IV opiates along with muscle relaxants and bowel regimen. Patient did receive femoral nerve block before procedure. Start mobilizing with PT OT today. Weightbearing as tolerated right lower extremity. DVT prophylaxis with Eliquis when okay with surgery. Will likely need short-term rehab before going back to her apartment.  Chronic medical issues including  paroxysmal A-fib: Continue metoprolol and Multaq.  Currently in sinus rhythm.  Holding Eliquis. Chronic diastolic heart failure: Appears compensated. Coronary artery disease, currently compensated.  Holding aspirin perioperative. Chronic bronchitis and nocturnal hypoxemia: On 2 L oxygen at night.  Continue bronchodilator as needed along with steroid inhalers. Electrolytes, potassium replaced.     DVT prophylaxis: SCDs Start: 08/21/22 2358   Code Status: Full code Family Communication: None at the bedside Disposition Plan: Status is: Inpatient Remains inpatient appropriate because: Inpatient surgery     Consultants:  Orthopedics  Procedures:  None  Antimicrobials:  None   Subjective:  Patient came back from surgery.  Denies any complaints.  Denies any pain today.  Objective: Vitals:   08/23/22  1015 08/23/22 1030 08/23/22 1045 08/23/22 1102  BP: 134/64 122/62 (!) 118/54 (!) 113/54  Pulse: 82 74 70 72  Resp: 18 19 16 16  $ Temp:    98.2 F (36.8 C)  TempSrc:    Oral  SpO2: 93% 96% 95% 95%  Weight:      Height:        Intake/Output Summary (Last 24 hours) at 08/23/2022 1326 Last data filed at 08/23/2022 0948 Gross per 24 hour  Intake 500 ml  Output --  Net 500 ml   Filed Weights   08/21/22 1952 08/22/22 1458  Weight: 87.5 kg 87.5 kg    Examination:  General exam: Appears calm and comfortable.  Pleasant.  Interactive. Respiratory system: Clear to auscultation. Respiratory effort normal.  On 2 L oxygen.  No added sounds. Cardiovascular system: S1 & S2 heard, RRR.  Pacemaker in place.   Gastrointestinal system: Abdomen is nondistended, soft and nontender. No organomegaly or masses felt. Normal bowel sounds heard. Central nervous system: Alert and oriented. No focal neurological deficits. Extremities: Right hip, immediate postop dressing.  Not removed by me.    Data Reviewed: I have personally reviewed following labs and imaging studies  CBC: Recent Labs  Lab 08/21/22 2021 08/22/22 0430 08/23/22 0100  WBC 11.2* 7.5 7.2  NEUTROABS 9.0*  --   --   HGB 11.4* 10.4* 10.3*  HCT 35.9* 33.2* 32.3*  MCV 86.5 86.5 86.6  PLT 196 170 148*    Basic Metabolic Panel: Recent Labs  Lab 08/21/22 2021 08/22/22 0430 08/23/22 0100  NA 141 139 137  K 3.4* 4.2 4.2  CL 106 105 105  CO2 25 25 26  $ GLUCOSE 104* 102* 114*  BUN 31* 28* 23  CREATININE 1.00 0.92 0.87  CALCIUM 8.4*  8.0* 8.0*  MG  --  2.1  --     GFR: Estimated Creatinine Clearance: 42.2 mL/min (by C-G formula based on SCr of 0.87 mg/dL). Liver Function Tests: No results for input(s): "AST", "ALT", "ALKPHOS", "BILITOT", "PROT", "ALBUMIN" in the last 168 hours. No results for input(s): "LIPASE", "AMYLASE" in the last 168 hours. No results for input(s): "AMMONIA" in the last 168 hours. Coagulation Profile: No  results for input(s): "INR", "PROTIME" in the last 168 hours. Cardiac Enzymes: No results for input(s): "CKTOTAL", "CKMB", "CKMBINDEX", "TROPONINI" in the last 168 hours. BNP (last 3 results) No results for input(s): "PROBNP" in the last 8760 hours. HbA1C: No results for input(s): "HGBA1C" in the last 72 hours. CBG: No results for input(s): "GLUCAP" in the last 168 hours. Lipid Profile: No results for input(s): "CHOL", "HDL", "LDLCALC", "TRIG", "CHOLHDL", "LDLDIRECT" in the last 72 hours. Thyroid Function Tests: No results for input(s): "TSH", "T4TOTAL", "FREET4", "T3FREE", "THYROIDAB" in the last 72 hours. Anemia Panel: No results for input(s): "VITAMINB12", "FOLATE", "FERRITIN", "TIBC", "IRON", "RETICCTPCT" in the last 72 hours. Sepsis Labs: No results for input(s): "PROCALCITON", "LATICACIDVEN" in the last 168 hours.  Recent Results (from the past 240 hour(s))  Surgical pcr screen     Status: None   Collection Time: 08/23/22  7:37 AM   Specimen: Nasal Mucosa; Nasal Swab  Result Value Ref Range Status   MRSA, PCR NEGATIVE NEGATIVE Final   Staphylococcus aureus NEGATIVE NEGATIVE Final    Comment: (NOTE) The Xpert SA Assay (FDA approved for NASAL specimens in patients 21 years of age and older), is one component of a comprehensive surveillance program. It is not intended to diagnose infection nor to guide or monitor treatment. Performed at [facility], [place] [address] [place], [place] [address]          Radiology Studies: DG HIP UNILAT WITH PELVIS 2-3 VIEWS RIGHT  Result Date: 08/23/2022 CLINICAL DATA:  89 year old female with right hip fracture status post pinning today. EXAM: DG HIP (WITH OR WITHOUT PELVIS) 2-3V RIGHT COMPARISON:  Intraoperative images beginning 0851 hours today. Preoperative right hip series 08/21/2022. FINDINGS: AP view of the pelvis, AP and frogleg lateral views of the right hip demonstrate 3 cannulated screws traversing the proximal right femur  now from the intertrochanteric segment across the neck into the head. Hardware appears intact. Right femoral head remains normally located. Stable underlying hip joint space loss and osteophytosis. Pre-existing left hip arthroplasty and chronic appearing right pubic rami fractures. No new osseous abnormality identified. Lumbar spine dextroconvex scoliosis and degeneration. Negative visible bowel gas. IMPRESSION: 1. Cannulated screw fixation of the proximal right femur with no adverse features. 2. Chronic right pubic rami fractures, left hip arthroplasty. Electronically Signed   By: Genevie Ann M.D.   On: 08/23/2022 12:49   DG FEMUR, MIN 2 VIEWS RIGHT  Result Date: 08/23/2022 CLINICAL DATA:  ORIF right femoral neck fracture EXAM: RIGHT FEMUR 2 VIEWS; DG C-ARM 1-60 MIN-NO REPORT COMPARISON:  08/21/2022 CT pelvis and right hip radiographs FLUOROSCOPY TIME:  Radiation Exposure Index (if provided by the fluoroscopic device): 28.4 mGy FINDINGS: Multiple nondiagnostic spot fluoroscopic intraoperative radiographs demonstrate transfixation of the right femoral neck with three well-positioned pins. IMPRESSION: Intraoperative fluoroscopic guidance for ORIF right femoral neck fracture. Electronically Signed   By: Ilona Sorrel M.D.   On: 08/23/2022 10:10   DG C-Arm 1-60 Min-No Report  Result Date: 08/23/2022 Fluoroscopy was utilized by the requesting physician.  No radiographic interpretation.   DG  C-Arm 1-60 Min-No Report  Result Date: 08/23/2022 Fluoroscopy was utilized by the requesting physician.  No radiographic interpretation.   CT PELVIS WO CONTRAST  Result Date: 08/21/2022 CLINICAL DATA:  Hip trauma, fracture suspected, xray done. Right hip pain EXAM: CT PELVIS WITHOUT CONTRAST TECHNIQUE: Multidetector CT imaging of the pelvis was performed following the standard protocol without intravenous contrast. RADIATION DOSE REDUCTION: This exam was performed according to the departmental dose-optimization program  which includes automated exposure control, adjustment of the mA and/or kV according to patient size and/or use of iterative reconstruction technique. COMPARISON:  None Available. FINDINGS: Urinary Tract:  No abnormality visualized. Bowel: Marked sigmoid diverticulosis. Visualized bowel loops are otherwise unremarkable. Vascular/Lymphatic: Extensive aortoiliac atherosclerotic calcification. No aortic aneurysm. No pathologic adenopathy. Reproductive: Gas within the uterine cavity is nonspecific and may relate to instrumentation though endometritis could appear similarly. The pelvic organs are otherwise unremarkable on this noncontrast examination. Other:  None Musculoskeletal: The osseous structures are diffusely osteopenic. There is abnormal angulation of the right femoral neck as well as a subtle sagittally oriented fracture plane best seen on coronal image # 69/9 in keeping with a minimally angulated, nondisplaced transcervical femoral neck fracture. No dislocation. Remote healed fractures of the right superior and inferior pubic rami. Left hip bipolar hemiarthroplasty has been performed. Advanced degenerative changes are seen within the right hip. IMPRESSION: 1. Faintly visualized, minimally angulated, nondisplaced transcervical right femoral neck fracture. The fracture is poorly visualized, however, due to marked osteopenia. If indicated, this fracture could be confirmed with MRI examination. 2. Gas within the uterine cavity is nonspecific and may relate to instrumentation though endometritis could appear similarly. Correlation with clinical and laboratory examination may be helpful. 3. Marked sigmoid diverticulosis. 4. Extensive aortoiliac atherosclerotic calcification. Aortic Atherosclerosis (ICD10-I70.0). Electronically Signed   By: Fidela Salisbury M.D.   On: 08/21/2022 22:13   DG Chest Port 1 View  Result Date: 08/21/2022 CLINICAL DATA:  Hip pain after a fall. EXAM: PORTABLE CHEST 1 VIEW COMPARISON:   10/19/2019 FINDINGS: Cardiac pacemaker. Mild cardiac enlargement. Emphysematous changes in the lungs. Peribronchial thickening and bronchiectasis consistent with chronic bronchitis and unchanged. No airspace disease or consolidation. No pleural effusions. No pneumothorax. Mediastinal contours appear intact. Calcification of the aorta. Old appearing bilateral rib fractures. IMPRESSION: Mild cardiac enlargement. No evidence of active pulmonary disease. Emphysematous and chronic bronchitic changes in the lungs. Electronically Signed   By: Lucienne Capers M.D.   On: 08/21/2022 21:15   DG Hip Unilat W or Wo Pelvis 2-3 Views Right  Result Date: 08/21/2022 CLINICAL DATA:  Hip pain after a fall.  Trip and fall injury. EXAM: DG HIP (WITH OR WITHOUT PELVIS) 2-3V RIGHT COMPARISON:  None Available. FINDINGS: Degenerative changes in the right hip with joint space narrowing and sclerosis and prominent osteophyte formation on the femoral head. No evidence of acute fracture or dislocation of the hip. No focal bone lesions identified. Deformities demonstrated in the right superior and inferior pubic rami as well as in the right iliac bone. Age is indeterminate. Consider CT if clinical indications suggest acute pelvic fracture. Previous left hip arthroplasty. Degenerative changes in the lumbar spine with scoliosis convex towards the right. IMPRESSION: 1. Degenerative changes in the right hip. No acute displaced fractures identified. 2. Fracture deformities in the right hemipelvis including the iliac bone, superior and inferior pubic rami. Age is indeterminate and if acute fracture is suspected, consider CT. Electronically Signed   By: Lucienne Capers M.D.   On: 08/21/2022 21:14  Scheduled Meds:  acetaminophen  650 mg Oral Q8H   dronedarone  400 mg Oral BID WC   fentaNYL       isosorbide mononitrate  30 mg Oral Daily   loperamide  2 mg Oral TID AC   metoprolol succinate  25 mg Oral Daily    mometasone-formoterol  2 puff Inhalation BID   pravastatin  20 mg Oral Daily   Continuous Infusions:  acetaminophen      ceFAZolin (ANCEF) IV     methocarbamol (ROBAXIN) IV       LOS: 2 days    Time spent: 35 minutes    Barb Merino, MD [facility] Pager [phone]

## 2022-08-23 NOTE — Transfer of Care (Signed)
Immediate Anesthesia Transfer of Care Note  Patient: Dana Morrison  Procedure(s) Performed: CANNULATED HIP PINNING (Right: Hip)  Patient Location: PACU  Anesthesia Type:General  Level of Consciousness: awake and alert   Airway & Oxygen Therapy: Patient Spontanous Breathing and Patient connected to nasal cannula oxygen  Post-op Assessment: Report given to RN and Post -op Vital signs reviewed and stable  Post vital signs: Reviewed and stable  Last Vitals:  Vitals Value Taken Time  BP 127/64   Temp    Pulse 85 08/23/22 1002  Resp 28 08/23/22 1002  SpO2 90 % 08/23/22 1002  Vitals shown include unvalidated device data.  Last Pain:  Vitals:   08/23/22 0734  TempSrc: Oral  PainSc: 0-No pain         Complications: No notable events documented.

## 2022-08-24 ENCOUNTER — Encounter (HOSPITAL_COMMUNITY): Payer: Self-pay | Admitting: Orthopedic Surgery

## 2022-08-24 DIAGNOSIS — S72001A Fracture of unspecified part of neck of right femur, initial encounter for closed fracture: Secondary | ICD-10-CM | POA: Diagnosis not present

## 2022-08-24 LAB — BASIC METABOLIC PANEL
Anion gap: 8 (ref 5–15)
BUN: 26 mg/dL — ABNORMAL HIGH (ref 8–23)
CO2: 26 mmol/L (ref 22–32)
Calcium: 8.1 mg/dL — ABNORMAL LOW (ref 8.9–10.3)
Chloride: 102 mmol/L (ref 98–111)
Creatinine, Ser: 0.89 mg/dL (ref 0.44–1.00)
GFR, Estimated: 59 mL/min — ABNORMAL LOW (ref >60)
Glucose, Bld: 140 mg/dL — ABNORMAL HIGH (ref 70–99)
Potassium: 4.2 mmol/L (ref 3.5–5.1)
Sodium: 136 mmol/L (ref 135–145)

## 2022-08-24 LAB — CBC
HCT: 31.1 % — ABNORMAL LOW (ref 36.0–46.0)
Hemoglobin: 10.2 g/dL — ABNORMAL LOW (ref 12.0–15.0)
MCH: 27.9 pg (ref 26.0–34.0)
MCHC: 32.8 g/dL (ref 30.0–36.0)
MCV: 85 fL (ref 80.0–100.0)
Platelets: 142 10*3/uL — ABNORMAL LOW (ref 150–400)
RBC: 3.66 MIL/uL — ABNORMAL LOW (ref 3.87–5.11)
RDW: 14.9 % (ref 11.5–15.5)
WBC: 8.3 10*3/uL (ref 4.0–10.5)
nRBC: 0 % (ref 0.0–0.2)

## 2022-08-24 LAB — GLUCOSE, CAPILLARY: Glucose-Capillary: 137 mg/dL — ABNORMAL HIGH (ref 70–99)

## 2022-08-24 MED ORDER — KETOROLAC TROMETHAMINE 15 MG/ML IJ SOLN: 15.0000 mg | Freq: Three times a day (TID) | INTRAMUSCULAR | Status: DC

## 2022-08-24 MED ORDER — ACETAMINOPHEN 500 MG PO TABS
1000.0000 mg | ORAL_TABLET | Freq: Three times a day (TID) | ORAL | Status: DC
  Administered 2022-08-24 – 2022-08-29 (×15): 1000 mg via ORAL
  Filled 2022-08-24 (×15): qty 2

## 2022-08-24 MED ORDER — ACETAMINOPHEN 325 MG PO TABS: 650.0000 mg | ORAL_TABLET | Freq: Three times a day (TID) | ORAL | Status: DC | PRN

## 2022-08-24 MED ORDER — KETOROLAC TROMETHAMINE 15 MG/ML IJ SOLN
7.5000 mg | Freq: Three times a day (TID) | INTRAMUSCULAR | Status: AC
  Administered 2022-08-24 – 2022-08-26 (×5): 7.5 mg via INTRAVENOUS
  Filled 2022-08-24 (×5): qty 1

## 2022-08-24 MED ORDER — OXYCODONE HCL 5 MG PO TABS
5.0000 mg | ORAL_TABLET | ORAL | Status: DC | PRN
  Administered 2022-08-25 – 2022-08-29 (×15): 5 mg via ORAL
  Filled 2022-08-24 (×15): qty 1

## 2022-08-24 MED ORDER — APIXABAN 5 MG PO TABS
5.0000 mg | ORAL_TABLET | Freq: Two times a day (BID) | ORAL | Status: DC
  Administered 2022-08-24 – 2022-08-29 (×10): 5 mg via ORAL
  Filled 2022-08-24 (×10): qty 1

## 2022-08-24 NOTE — Discharge Instructions (Signed)
Orthopaedic Trauma Service Discharge Instructions   General Discharge Instructions  Orthopaedic Injuries:  Right femoral neck fracture treated with percutaneous screw fixation  WEIGHT BEARING STATUS: Weight-bear as tolerated right leg with assistance such as a walker  RANGE OF MOTION/ACTIVITY: Unrestricted range of motion of hip.  Slowly increase activity level  Bone health: Continue to optimize nutrition.  Fracture is a fragility fracture which is indicative of osteoporosis  Review the following resource for additional information regarding bone health  [email or web address]  Wound Care: Daily dressing changes to right hip.  Can use 4 x 4 gauze and tape or a Mepilex type dressing.  Once there is no drainage you may shower and clean wounds with soap and water only.  Do not apply ointments lotions or solutions until directed to do so   DVT/PE prophylaxis:resume home Eliquis  Diet: as you were eating previously.  Can use over the counter stool softeners and bowel preparations, such as Miralax, to help with bowel movements.  Narcotics can be constipating.  Be sure to drink plenty of fluids  PAIN MEDICATION USE AND EXPECTATIONS  You have likely been given narcotic medications to help control your pain.  After a traumatic event that results in an fracture (broken bone) with or without surgery, it is ok to use narcotic pain medications to help control one's pain.  We understand that everyone responds to pain differently and each individual patient will be evaluated on a regular basis for the continued need for narcotic medications. Ideally, narcotic medication use should last no more than 6-8 weeks (coinciding with fracture healing).   As a patient it is your responsibility as well to monitor narcotic medication use and report the amount and frequency you use these medications when you come to your office visit.   We would also advise that if you are using narcotic  medications, you should take a dose prior to therapy to maximize you participation.  IF YOU ARE ON NARCOTIC MEDICATIONS IT IS NOT PERMISSIBLE TO OPERATE A MOTOR VEHICLE (MOTORCYCLE/CAR/TRUCK/MOPED) OR HEAVY MACHINERY DO NOT MIX NARCOTICS WITH OTHER CNS (CENTRAL NERVOUS SYSTEM) DEPRESSANTS SUCH AS ALCOHOL   POST-OPERATIVE OPIOID TAPER INSTRUCTIONS: It is important to wean off of your opioid medication as soon as possible. If you do not need pain medication after your surgery it is ok to stop day one. Opioids include: Codeine, Hydrocodone(Norco, Vicodin), Oxycodone(Percocet, oxycontin) and hydromorphone amongst others.  Long term and even short term use of opiods can cause: Increased pain response Dependence Constipation Depression Respiratory depression And more.  Withdrawal symptoms can include Flu like symptoms Nausea, vomiting And more Techniques to manage these symptoms Hydrate well Eat regular healthy meals Stay active Use relaxation techniques(deep breathing, meditating, yoga) Do Not substitute Alcohol to help with tapering If you have been on opioids for less than two weeks and do not have pain than it is ok to stop all together.  Plan to wean off of opioids This plan should start within one week post op of your fracture surgery  Maintain the same interval or time between taking each dose and first decrease the dose.  Cut the total daily intake of opioids by one tablet each day Next start to increase the time between doses. The last dose that should be eliminated is the evening dose.    STOP SMOKING OR USING NICOTINE PRODUCTS!!!!  As discussed nicotine severely impairs your body's ability to heal surgical and traumatic wounds but also impairs bone healing.  Wounds and bone  heal by forming microscopic blood vessels (angiogenesis) and nicotine is a vasoconstrictor (essentially, shrinks blood vessels).  Therefore, if vasoconstriction occurs to these microscopic blood vessels  they essentially disappear and are unable to deliver necessary nutrients to the healing tissue.  This is one modifiable factor that you can do to dramatically increase your chances of healing your injury.    (This means no smoking, no nicotine gum, patches, etc)  DO NOT USE NONSTEROIDAL ANTI-INFLAMMATORY DRUGS (NSAID'S)  Using products such as Advil (ibuprofen), Aleve (naproxen), Motrin (ibuprofen) for additional pain control during fracture healing can delay and/or prevent the healing response.  If you would like to take over the counter (OTC) medication, Tylenol (acetaminophen) is ok.  However, some narcotic medications that are given for pain control contain acetaminophen as well. Therefore, you should not exceed more than 4000 mg of tylenol in a day if you do not have liver disease.  Also note that there are may OTC medicines, such as cold medicines and allergy medicines that my contain tylenol as well.  If you have any questions about medications and/or interactions please ask your doctor/PA or your pharmacist.      ICE AND ELEVATE INJURED/OPERATIVE EXTREMITY  Using ice and elevating the injured extremity above your heart can help with swelling and pain control.  Icing in a pulsatile fashion, such as 20 minutes on and 20 minutes off, can be followed.    Do not place ice directly on skin. Make sure there is a barrier between to skin and the ice pack.    Using frozen items such as frozen peas works well as the conform nicely to the are that needs to be iced.  USE AN ACE WRAP OR TED HOSE FOR SWELLING CONTROL  In addition to icing and elevation, Ace wraps or TED hose are used to help limit and resolve swelling.  It is recommended to use Ace wraps or TED hose until you are informed to stop.    When using Ace Wraps start the wrapping distally (farthest away from the body) and wrap proximally (closer to the body)   Example: If you had surgery on your leg or thing and you do not have a splint on, start  the ace wrap at the toes and work your way up to the thigh        If you had surgery on your upper extremity and do not have a splint on, start the ace wrap at your fingers and work your way up to the upper arm  IF YOU ARE IN A SPLINT OR CAST DO NOT Mobile   If your splint gets wet for any reason please contact the office immediately. You may shower in your splint or cast as long as you keep it dry.  This can be done by wrapping in a cast cover or garbage back (or similar)  Do Not stick any thing down your splint or cast such as pencils, money, or hangers to try and scratch yourself with.  If you feel itchy take benadryl as prescribed on the bottle for itching  IF YOU ARE IN A CAM BOOT (BLACK BOOT)  You may remove boot periodically. Perform daily dressing changes as noted below.  Wash the liner of the boot regularly and wear a sock when wearing the boot. It is recommended that you sleep in the boot until told otherwise    Call office for the following: Temperature greater than 101F Persistent nausea and vomiting  Severe uncontrolled pain Redness, tenderness, or signs of infection (pain, swelling, redness, odor or green/yellow discharge around the site) Difficulty breathing, headache or visual disturbances Hives Persistent dizziness or light-headedness Extreme fatigue Any other questions or concerns you may have after discharge  In an emergency, call 911 or go to an Emergency Department at a nearby hospital  HELPFUL INFORMATION  If you had a block, it will wear off between 8-24 hrs postop typically.  This is period when your pain may go from nearly zero to the pain you would have had postop without the block.  This is an abrupt transition but nothing dangerous is happening.  You may take an extra dose of narcotic when this happens.  You should wean off your narcotic medicines as soon as you are able.  Most patients will be off or using minimal narcotics before their first  postop appointment.   We suggest you use the pain medication the first night prior to going to bed, in order to ease any pain when the anesthesia wears off. You should avoid taking pain medications on an empty stomach as it will make you nauseous.  Do not drink alcoholic beverages or take illicit drugs when taking pain medications.  In most states it is against the law to drive while you are in a splint or sling.  And certainly against the law to drive while taking narcotics.  You may return to work/school in the next couple of days when you feel up to it.   Pain medication may make you constipated.  Below are a few solutions to try in this order: Decrease the amount of pain medication if you aren't having pain. Drink lots of decaffeinated fluids. Drink prune juice and/or each dried prunes  If the first 3 don't work start with additional solutions Take Colace - an over-the-counter stool softener Take Senokot - an over-the-counter laxative Take Miralax - a stronger over-the-counter laxative     CALL THE OFFICE WITH ANY QUESTIONS OR CONCERNS: [phone]   VISIT OUR WEBSITE FOR ADDITIONAL INFORMATION: [email or web address]

## 2022-08-24 NOTE — Progress Notes (Signed)
Patient reassessed and states her pain control is now better.

## 2022-08-24 NOTE — Progress Notes (Signed)
OT Cancellation Note  Patient Details Name: Margarete Worland MRN: [id number] DOB: Jan 04, 1924   Cancelled Treatment:    Reason Eval/Treat Not Completed: Pain limiting ability to participate (Pt premedicated. Positioned R LE on pillow and applied ice. RN aware pt remains in significant pain.) Will continue to follow.   Malka So 08/24/2022, 3:03 PM Cleta Alberts, OTR/L Acute Rehabilitation Services Office: [phone]

## 2022-08-24 NOTE — Progress Notes (Signed)
Orthopaedic Trauma Service Progress Note  Patient ID: Dana Morrison MRN: [id number] DOB/AGE: 1924/02/23 89 y.o.  Subjective:  Doing great Mild pain R hip but o/w no issues  Got up to chair with nurse this am   No ambulatory aides PTA    ROS As above Objective:   VITALS:   Vitals:   08/23/22 2057 08/24/22 0352 08/24/22 0726 08/24/22 0816  BP: (!) 112/53 (!) 120/56 124/65   Pulse: 73 69 65 65  Resp: 16 16 16 16  $ Temp: 98.1 F (36.7 C) (!) 97.5 F (36.4 C) 98 F (36.7 C)   TempSrc: Oral Oral Oral   SpO2: 93% 96% 95% 95%  Weight:      Height:        Estimated body mass index is 31.15 kg/m as calculated from the following:   Height as of this encounter: 5' 5.98" (1.676 m).   Weight as of this encounter: 87.5 kg.   Intake/Output      02/15 0701 02/16 0700 02/16 0701 02/17 0700   I.V. (mL/kg) 500 (5.7)    IV Piggyback 171.2    Total Intake(mL/kg) 671.2 (7.7)    Urine (mL/kg/hr) 850 (0.4)    Total Output 850    Net -178.8           LABS  Results for orders placed or performed during the hospital encounter of 08/21/22 (from the past 24 hour(s))  Glucose, capillary     Status: Abnormal   Collection Time: 08/23/22  9:16 PM  Result Value Ref Range   Glucose-Capillary 145 (H) 70 - 99 mg/dL  Glucose, capillary     Status: Abnormal   Collection Time: 08/24/22  4:11 AM  Result Value Ref Range   Glucose-Capillary 137 (H) 70 - 99 mg/dL  CBC     Status: Abnormal   Collection Time: 08/24/22  4:22 AM  Result Value Ref Range   WBC 8.3 4.0 - 10.5 K/uL   RBC 3.66 (L) 3.87 - 5.11 MIL/uL   Hemoglobin 10.2 (L) 12.0 - 15.0 g/dL   HCT 31.1 (L) 36.0 - 46.0 %   MCV 85.0 80.0 - 100.0 fL   MCH 27.9 26.0 - 34.0 pg   MCHC 32.8 30.0 - 36.0 g/dL   RDW 14.9 11.5 - 15.5 %   Platelets 142 (L) 150 - 400 K/uL   nRBC 0.0 0.0 - 0.2 %  Basic metabolic panel     Status: Abnormal   Collection Time: 08/24/22   4:22 AM  Result Value Ref Range   Sodium 136 135 - 145 mmol/L   Potassium 4.2 3.5 - 5.1 mmol/L   Chloride 102 98 - 111 mmol/L   CO2 26 22 - 32 mmol/L   Glucose, Bld 140 (H) 70 - 99 mg/dL   BUN 26 (H) 8 - 23 mg/dL   Creatinine, Ser 0.89 0.44 - 1.00 mg/dL   Calcium 8.1 (L) 8.9 - 10.3 mg/dL   GFR, Estimated 59 (L) >60 mL/min   Anion gap 8 5 - 15     PHYSICAL EXAM:   Gen: sitting up in chair, NAD, pleasant, looks good  Lungs: unlabored Cardiac: reg Ext:       Right Lower Extremity   Dressing R hip stable, scant strikethrough   Ext warm   Distal motor and  sensory functions intact  Length, alignment and rotation all look symmetric to contra-lateral side   No DCT  Compartments soft  + DP pulse    Assessment/Plan: 1 Day Post-Op      Anti-infectives (From admission, onward)    Start     Dose/Rate Route Frequency Ordered Stop   08/23/22 1600  ceFAZolin (ANCEF) IVPB 2g/100 mL premix        2 g 200 mL/hr over 30 Minutes Intravenous Every 8 hours 08/23/22 1059 08/24/22 0610     .  POD/HD#: 1  89 y/o female s/p fall with impacted right femoral neck fracture   -fall  - impacted R femoral neck fracture s/p percutaneous screw fixation  Weightbearing WBAT R leg with walker   ROM/Activity   Unrestricted ROM R hip    Activity as tolerated    Wound care   Daily dressing changes starting on 08/26/2022      Therapy evals   TOC consult for SNF  - Pain management:  Multimodal   Minimize narcotics  - ABL anemia/Hemodynamics  Stable  - Medical issues   Per Primary   - DVT/PE prophylaxis:  [place] tor resume home eliquis today   - ID:   Periop abx   - Metabolic Bone Disease:  This is a fragility fracture  Vitamin levels pending   - FEN/GI prophylaxis/Foley/Lines:  Reg diet  Appreciate RD consult to optimize nutrition for healing   - Impediments to fracture healing:  Osteoporosis   - Dispo:  Ortho issues stable  TOC consult for SNF  Follow up with ortho  in 2 weeks for suture removal and xrays     Jari Pigg, PA-C [phone] (C) 08/24/2022, 10:08 AM  [facility] [place] [phone] Jenetta [phone] (F)    After 5pm and on the weekends please log on to Amion, go to orthopaedics and the look under the [facility] Call for the provider(s) on call. You can also call our office at [phone] and then follow the prompts to be connected to the call team.  Patient ID: Dana Morrison, female   DOB: 1923-10-10, 89 y.o.   MRN: [id number]

## 2022-08-24 NOTE — Progress Notes (Signed)
PROGRESS NOTE    Dana Morrison  [id number] DOB: October 21, 1923 DOA: 08/21/2022 PCP: Sueanne Margarita, DO    Brief Narrative:  89 year old ambulatory female from home with history of hypertension, coronary artery disease, paroxysmal A-fib on Eliquis, sick sinus syndrome status post pacemaker, nocturnal oxygen dependent presented with mechanical fall and right hip fracture.  Last dose of Eliquis was 2/13 morning.  In the emergency room hemodynamically stable.  CT scan right femur with nondisplaced transcervical right femoral neck fracture.  Admitted with orthopedic consultation.   Assessment & Plan:   Closed traumatic right hip fracture: ORIF with IM nail 2/15.  Dr. Marcelino Scot.   Adequate pain medication, oral and IV opiates along with muscle relaxants and bowel regimen. Weightbearing as tolerated right lower extremity. DVT prophylaxis with Eliquis.  Resume today. Will likely need short-term rehab before going back to her apartment. Appropriate for acute inpatient rehab.  Consult placed.  Chronic medical issues including  paroxysmal A-fib: Continue metoprolol and Multaq.  Currently in sinus rhythm.  Resume Eliquis. Chronic diastolic heart failure: Appears compensated. Coronary artery disease, currently compensated.  Resume aspirin on discharge. Chronic bronchitis and nocturnal hypoxemia: On 2 L oxygen at night.  Continue bronchodilator as needed along with steroid inhalers. Electrolytes, potassium replaced and adequate.     DVT prophylaxis: SCDs Start: 08/21/22 2358 apixaban (ELIQUIS) tablet 5 mg   Code Status: Full code Family Communication: Daughter at bedside. Disposition Plan: Status is: Inpatient Remains inpatient appropriate because: Inpatient surgery     Consultants:  Orthopedics  Procedures:  None  Antimicrobials:  None   Subjective:  Seen and examined in the morning rounds.  Her daughter was at the bedside.  She was out of bed to the chair with the help of nurse.   Minimal pain at rest, she had some difficulties with stepping on the right foot however she thinks she did very well.  Objective: Vitals:   08/24/22 0352 08/24/22 0726 08/24/22 0816 08/24/22 1343  BP: (!) 120/56 124/65    Pulse: 69 65 65   Resp: 16 16 16   $ Temp: (!) 97.5 F (36.4 C) 98 F (36.7 C)    TempSrc: Oral Oral    SpO2: 96% 95% 95% 92%  Weight:      Height:        Intake/Output Summary (Last 24 hours) at 08/24/2022 1511 Last data filed at 08/24/2022 0538 Gross per 24 hour  Intake 171.21 ml  Output 550 ml  Net -378.79 ml    Filed Weights   08/21/22 1952 08/22/22 1458  Weight: 87.5 kg 87.5 kg    Examination:  General exam: Calm, comfortable.  Interactive. Respiratory system: Clear to auscultation. Respiratory effort normal.  On 2 L oxygen.  No added sounds. Cardiovascular system: S1 & S2 heard, RRR.  Pacemaker in place.   Gastrointestinal system: Abdomen is nondistended, soft and nontender. No organomegaly or masses felt. Normal bowel sounds heard. Central nervous system: Alert and oriented. No focal neurological deficits. Extremities: Right hip, mild swelling, no palpable tenderness or erythema.    Data Reviewed: I have personally reviewed following labs and imaging studies  CBC: Recent Labs  Lab 08/21/22 2021 08/22/22 0430 08/23/22 0100 08/24/22 0422  WBC 11.2* 7.5 7.2 8.3  NEUTROABS 9.0*  --   --   --   HGB 11.4* 10.4* 10.3* 10.2*  HCT 35.9* 33.2* 32.3* 31.1*  MCV 86.5 86.5 86.6 85.0  PLT 196 170 148* 142*    Basic Metabolic Panel: Recent Labs  Lab 08/21/22 2021 08/22/22 0430 08/23/22 0100 08/24/22 0422  NA 141 139 137 136  K 3.4* 4.2 4.2 4.2  CL 106 105 105 102  CO2 25 25 26 26  $ GLUCOSE 104* 102* 114* 140*  BUN 31* 28* 23 26*  CREATININE 1.00 0.92 0.87 0.89  CALCIUM 8.4* 8.0* 8.0* 8.1*  MG  --  2.1  --   --     GFR: Estimated Creatinine Clearance: 41.2 mL/min (by C-G formula based on SCr of 0.89 mg/dL). Liver Function Tests: No  results for input(s): "AST", "ALT", "ALKPHOS", "BILITOT", "PROT", "ALBUMIN" in the last 168 hours. No results for input(s): "LIPASE", "AMYLASE" in the last 168 hours. No results for input(s): "AMMONIA" in the last 168 hours. Coagulation Profile: No results for input(s): "INR", "PROTIME" in the last 168 hours. Cardiac Enzymes: No results for input(s): "CKTOTAL", "CKMB", "CKMBINDEX", "TROPONINI" in the last 168 hours. BNP (last 3 results) No results for input(s): "PROBNP" in the last 8760 hours. HbA1C: No results for input(s): "HGBA1C" in the last 72 hours. CBG: Recent Labs  Lab 08/23/22 2116 08/24/22 0411  GLUCAP 145* 137*   Lipid Profile: No results for input(s): "CHOL", "HDL", "LDLCALC", "TRIG", "CHOLHDL", "LDLDIRECT" in the last 72 hours. Thyroid Function Tests: No results for input(s): "TSH", "T4TOTAL", "FREET4", "T3FREE", "THYROIDAB" in the last 72 hours. Anemia Panel: No results for input(s): "VITAMINB12", "FOLATE", "FERRITIN", "TIBC", "IRON", "RETICCTPCT" in the last 72 hours. Sepsis Labs: No results for input(s): "PROCALCITON", "LATICACIDVEN" in the last 168 hours.  Recent Results (from the past 240 hour(s))  Surgical pcr screen     Status: None   Collection Time: 08/23/22  7:37 AM   Specimen: Nasal Mucosa; Nasal Swab  Result Value Ref Range Status   MRSA, PCR NEGATIVE NEGATIVE Final   Staphylococcus aureus NEGATIVE NEGATIVE Final    Comment: (NOTE) The Xpert SA Assay (FDA approved for NASAL specimens in patients 27 years of age and older), is one component of a comprehensive surveillance program. It is not intended to diagnose infection nor to guide or monitor treatment. Performed at [facility], [place] [address] [place], [place] [address]          Radiology Studies: DG HIP UNILAT WITH PELVIS 2-3 VIEWS RIGHT  Result Date: 08/23/2022 CLINICAL DATA:  89 year old female with right hip fracture status post pinning today. EXAM: DG HIP (WITH OR WITHOUT  PELVIS) 2-3V RIGHT COMPARISON:  Intraoperative images beginning 0851 hours today. Preoperative right hip series 08/21/2022. FINDINGS: AP view of the pelvis, AP and frogleg lateral views of the right hip demonstrate 3 cannulated screws traversing the proximal right femur now from the intertrochanteric segment across the neck into the head. Hardware appears intact. Right femoral head remains normally located. Stable underlying hip joint space loss and osteophytosis. Pre-existing left hip arthroplasty and chronic appearing right pubic rami fractures. No new osseous abnormality identified. Lumbar spine dextroconvex scoliosis and degeneration. Negative visible bowel gas. IMPRESSION: 1. Cannulated screw fixation of the proximal right femur with no adverse features. 2. Chronic right pubic rami fractures, left hip arthroplasty. Electronically Signed   By: Genevie Ann M.D.   On: 08/23/2022 12:49   DG FEMUR, MIN 2 VIEWS RIGHT  Result Date: 08/23/2022 CLINICAL DATA:  ORIF right femoral neck fracture EXAM: RIGHT FEMUR 2 VIEWS; DG C-ARM 1-60 MIN-NO REPORT COMPARISON:  08/21/2022 CT pelvis and right hip radiographs FLUOROSCOPY TIME:  Radiation Exposure Index (if provided by the fluoroscopic device): 28.4 mGy FINDINGS: Multiple nondiagnostic spot fluoroscopic  intraoperative radiographs demonstrate transfixation of the right femoral neck with three well-positioned pins. IMPRESSION: Intraoperative fluoroscopic guidance for ORIF right femoral neck fracture. Electronically Signed   By: Ilona Sorrel M.D.   On: 08/23/2022 10:10   DG C-Arm 1-60 Min-No Report  Result Date: 08/23/2022 Fluoroscopy was utilized by the requesting physician.  No radiographic interpretation.   DG C-Arm 1-60 Min-No Report  Result Date: 08/23/2022 Fluoroscopy was utilized by the requesting physician.  No radiographic interpretation.        Scheduled Meds:  acetaminophen  650 mg Oral Q8H   apixaban  5 mg Oral BID   dronedarone  400 mg Oral BID WC    feeding supplement  237 mL Oral BID BM   isosorbide mononitrate  30 mg Oral Daily   loperamide  2 mg Oral TID AC   metoprolol succinate  25 mg Oral Daily   mometasone-formoterol  2 puff Inhalation BID   multivitamin with minerals  1 tablet Oral Daily   pantoprazole  40 mg Oral Daily   pravastatin  20 mg Oral Daily   Continuous Infusions:  methocarbamol (ROBAXIN) IV       LOS: 3 days    Time spent: 35 minutes    Barb Merino, MD [facility] Pager [phone]

## 2022-08-24 NOTE — Progress Notes (Signed)
Patient reporting of uncontrolled pain to right hip despite pain medication being given. Surgery team made aware

## 2022-08-24 NOTE — Progress Notes (Addendum)
Physical Therapy Evaluation Patient Details Name: Dana Morrison MRN: [id number] DOB: [birth year] Today's Date: 08/24/2022  History of Present Illness  89 yo [place] on 2/13 for evaluation of R impacted femoral neck fracture, with cannulated screws for ORIF.  PMHx:  osteoporosis, HOH, a-fib, CHFN CAD, low vision, MI HTN, OA hip and knee, urticaria, venous insufficiency  Clinical Impression  Pt was seen for mobility on RW to stand and maneuver in her room, as well as to try to use BSC.  Pt is up to short walks with min assist, but is not safely able to return to [place].  Recommending her to CIR due to her PLOF, her motivation and willingness to work with therapy for a return home.  [place] is previously from [place] and should have access to more assist as needed.  Will follow acutely as her stay permits, and focus on standing endurance, strength and control of standing balance, as well as safety awareness.  Pt was put on room air during session, and was no lower than 91% on room air.  Pt was prepared for nursing to return her to bed with instructions for use of RW.     Recommendations for follow up therapy are one component of a multi-disciplinary discharge planning process, led by the attending physician.  Recommendations may be updated based on patient status, additional functional criteria and insurance authorization.  Follow Up Recommendations Acute inpatient rehab (3hours/day)      Assistance Recommended at Discharge Frequent or constant Supervision/Assistance  Patient can return home with the following  Two people to help with walking and/or transfers;A little help with bathing/dressing/bathroom;Assistance with cooking/housework;Direct supervision/assist for medications management;Direct supervision/assist for financial management;Assist for transportation;Help with stairs or ramp for entrance    Equipment Recommendations  Rolling walker (2 wheels)  Recommendations for Other Services  Rehab consult    Functional Status Assessment Patient has had a recent decline in their functional status and demonstrates the ability to make significant improvements in function in a reasonable and predictable amount of time.     Precautions / Restrictions Precautions Precautions: Fall Precaution Comments: HOH Restrictions Weight Bearing Restrictions: Yes RLE Weight Bearing: Weight bearing as tolerated      Mobility  Bed Mobility               General bed mobility comments: up in chair when PT arrived    Transfers Overall transfer level: Needs assistance Equipment used: Rolling walker (2 wheels), 1 person hand held assist Transfers: Sit to/from Stand Sit to Stand: Min assist           General transfer comment: pt is motivated and apparently well medicated for initiating standing    Ambulation/Gait Ambulation/Gait assistance: Min assist, +2 physical assistance, +2 safety/equipment Gait Distance (Feet): 35 Feet (15+10) Assistive device: Rolling walker (2 wheels), 1 person hand held assist, Pushed wheelchair Gait Pattern/deviations: Step-through pattern, Decreased stride length, Decreased weight shift to right, Wide base of support, Trunk flexed Gait velocity: reduced Gait velocity interpretation: <1.31 ft/sec, indicative of household ambulator Pre-gait activities: standing balance ck General Gait Details: pt is compensating well for R hip with RW, slow pace and minimizing with RW to manage hip  Stairs            Wheelchair Mobility    Modified Rankin (Stroke Patients Only)       Balance Overall balance assessment: Needs assistance Sitting-balance support: Feet supported Sitting balance-Leahy  Scale: Fair     Standing balance support: Bilateral upper extremity supported, During functional activity Standing balance-Leahy Scale: Poor                                Pertinent Vitals/Pain Pain Assessment Pain Assessment: Faces Faces Pain Scale: Hurts even more Pain Location: no pain on R hip with rest, mild to moderate with mobility Pain Descriptors / Indicators: Guarding, Grimacing Pain Intervention(s): Limited activity within patient's tolerance, Monitored during session, Premedicated before session, Repositioned    Home Living Family/patient expects to be discharged to:: Inpatient rehab                   Additional Comments: Pt was independent on [place] [facility]    Prior Function Prior Level of Function : Independent/Modified Independent             Mobility Comments: rollator with no assist in [place] situation ADLs Comments: I per pt     Hand Dominance   Dominant Hand: Right    Extremity/Trunk Assessment   Upper Extremity Assessment Upper Extremity Assessment: Overall WFL for tasks assessed    Lower Extremity Assessment Lower Extremity Assessment: RLE deficits/detail RLE Deficits / Details: new ORIF with hip weakness RLE Coordination: decreased gross motor    Cervical / Trunk Assessment Cervical / Trunk Assessment: Kyphotic (able to support to stand)  Communication   Communication: HOH (compensates well with increased volume from speaker)  Cognition Arousal/Alertness: Awake/alert Behavior During Therapy: WFL for tasks assessed/performed Overall Cognitive Status: Within Functional Limits for tasks assessed                                 General Comments: pt is at times answering inappropriately only over hearing but can answer questions about home        General Comments General comments (skin integrity, edema, etc.): Pt was seen with family in to provide support to pt and in support of  therapy goals.  [place] is from [place] and given her high functional level at baseline, would recommend CIR consider admission    Exercises     Assessment/Plan    PT Assessment Patient needs continued PT  services  PT Problem List Decreased strength;Decreased range of motion;Decreased activity tolerance;Decreased balance;Decreased mobility;Decreased coordination;Decreased knowledge of use of DME;Decreased skin integrity;Pain       PT Treatment Interventions DME instruction;Gait training;Functional mobility training;Therapeutic activities;Therapeutic exercise;Balance training;Neuromuscular re-education;Patient/family education    PT Goals (Current goals can be found in the Care Plan section)  Acute Rehab PT Goals Patient Stated Goal: to walk and get stronger PT Goal Formulation: With patient/family Time For Goal Achievement: 09/07/22 Potential to Achieve Goals: Good    Frequency Min 4X/week     Co-evaluation               AM-PAC PT "6 Clicks" Mobility  Outcome Measure Help needed turning from your back to your side while in a flat bed without using bedrails?: A Little Help needed moving from lying on your back to sitting on the side of a flat bed without using bedrails?: A Little Help needed moving to and from a bed to a chair (including a wheelchair)?: A Little Help needed standing up from a chair using your arms (e.g., wheelchair or bedside chair)?: A Little Help needed to walk in hospital room?: Total  Help needed climbing 3-5 steps with a railing? : Total 6 Click Score: 14    End of Session Equipment Utilized During Treatment: Gait belt Activity Tolerance: Patient tolerated treatment well;Patient limited by pain Patient left: in chair;with call bell/phone within reach;with family/visitor present Nurse Communication: Mobility status PT Visit Diagnosis: Unsteadiness on feet (R26.81);Muscle weakness (generalized) (M62.81);Pain Pain - Right/Left: Right Pain - part of body: Hip    Time: [id number] PT Time Calculation (min) (ACUTE ONLY): 40 min   Charges:   PT Evaluation $PT Eval Moderate Complexity: 1 Mod PT Treatments $Gait Training: 8-22 mins $Therapeutic Activity:  8-22 mins       Ramond Dial 08/24/2022, 2:12 PM  Mee Hives, PT PhD Acute Rehab Dept. Number: [place] and [place]

## 2022-08-25 DIAGNOSIS — S72001A Fracture of unspecified part of neck of right femur, initial encounter for closed fracture: Secondary | ICD-10-CM | POA: Diagnosis not present

## 2022-08-25 LAB — BASIC METABOLIC PANEL
Anion gap: 5 (ref 5–15)
BUN: 31 mg/dL — ABNORMAL HIGH (ref 8–23)
CO2: 27 mmol/L (ref 22–32)
Calcium: 7.9 mg/dL — ABNORMAL LOW (ref 8.9–10.3)
Chloride: 103 mmol/L (ref 98–111)
Creatinine, Ser: 1.15 mg/dL — ABNORMAL HIGH (ref 0.44–1.00)
GFR, Estimated: 44 mL/min — ABNORMAL LOW (ref >60)
Glucose, Bld: 118 mg/dL — ABNORMAL HIGH (ref 70–99)
Potassium: 4.6 mmol/L (ref 3.5–5.1)
Sodium: 135 mmol/L (ref 135–145)

## 2022-08-25 LAB — CBC
HCT: 31.6 % — ABNORMAL LOW (ref 36.0–46.0)
Hemoglobin: 10.1 g/dL — ABNORMAL LOW (ref 12.0–15.0)
MCH: 27.3 pg (ref 26.0–34.0)
MCHC: 32 g/dL (ref 30.0–36.0)
MCV: 85.4 fL (ref 80.0–100.0)
Platelets: 166 10*3/uL (ref 150–400)
RBC: 3.7 MIL/uL — ABNORMAL LOW (ref 3.87–5.11)
RDW: 15 % (ref 11.5–15.5)
WBC: 7.5 10*3/uL (ref 4.0–10.5)
nRBC: 0 % (ref 0.0–0.2)

## 2022-08-25 NOTE — Progress Notes (Signed)
Inpatient Rehab Admissions Coordinator:    I met with Pt. And family at bedside to discuss potential CIR admit. They state Pt. Will have 24/7 support at d/c and are interested in pursuing admit. I did let them know that we are unlikely to get insurance auth for Pt.'s diagnosis but they still wish to pursue it. I will open a case with insurance and pursue for potential admit.   Clemens Catholic, [place], [place] Admissions Coordinator  [phone] ([place]) [phone] (office)

## 2022-08-25 NOTE — Progress Notes (Signed)
Physical Therapy Treatment Patient Details Name: Dana Morrison MRN: [id number] DOB: [birth year] Today's Date: 08/25/2022   History of Present Illness 89 yo female with [place] was [place] on 2/13 for evaluation of R impacted femoral neck fracture, with cannulated screws for ORIF.  PMHx:  osteoporosis, HOH, a-fib, CHFN CAD, low vision, MI HTN, OA hip and knee, urticaria, venous insufficiency    PT Comments    Progressing patient towards functional goals. Focused on transfer training (Min A), and gait training (Min A). States she didn't sleep well last night due to pain and is more fatigue with ambulating today. No buckling of LEs however limited WB tolerated today. Cues for posture and symmetry of steps, assist to advance RW as she became more fatigued. Repositioned for comfort and ice applied to Rt hip. Patient will continue to benefit from skilled physical therapy services to further improve independence with functional mobility.    Recommendations for follow up therapy are one component of a multi-disciplinary discharge planning process, led by the attending physician.  Recommendations may be updated based on patient status, additional functional criteria and insurance authorization.  Follow Up Recommendations  Acute inpatient rehab (3hours/day)     Assistance Recommended at Discharge Frequent or constant Supervision/Assistance  Patient can return home with the following Two people to help with walking and/or transfers;A little help with bathing/dressing/bathroom;Assistance with cooking/housework;Direct supervision/assist for medications management;Direct supervision/assist for financial management;Assist for transportation;Help with stairs or ramp for entrance   Equipment Recommendations  Rolling walker (2 wheels)    Recommendations for Other Services Rehab consult     Precautions / Restrictions Precautions Precautions: Fall Precaution Comments:  HOH Restrictions Weight Bearing Restrictions: Yes RLE Weight Bearing: Weight bearing as tolerated     Mobility  Bed Mobility Overal bed mobility: Needs Assistance Bed Mobility: Supine to Sit     Supine to sit: Min assist, HOB elevated     General bed mobility comments: Very light min assist for RLE support out of bed. Cues for technique. Able to use rail and press self into seated position.    Transfers Overall transfer level: Needs assistance Equipment used: Rolling walker (2 wheels) Transfers: Sit to/from Stand Sit to Stand: Min assist           General transfer comment: Min assist for boost to stand. Practiced x2 from bed. First round pt with urinary incontinence. Cues for technique and hand placement.    Ambulation/Gait Ambulation/Gait assistance: Min assist Gait Distance (Feet): 30 Feet Assistive device: Rolling walker (2 wheels) Gait Pattern/deviations: Step-to pattern, Step-through pattern, Decreased stride length, Decreased stance time - right, Trunk flexed, Antalgic Gait velocity: reduced Gait velocity interpretation: <1.31 ft/sec, indicative of household ambulator   General Gait Details: RW adjusted for improve leverage. Cues for upright posture and larger step length to improve symmetry and efficency of gait. No buckling noted however limited WB tolerance through RLE. Min assist through second half of distance for upright posture and RW advancement.   Stairs             Wheelchair Mobility    Modified Rankin (Stroke Patients Only)       Balance Overall balance assessment: Needs assistance Sitting-balance support: Feet supported Sitting balance-Leahy Scale: Fair     Standing balance support: Bilateral upper extremity supported, During functional activity Standing balance-Leahy Scale: Poor  Cognition Arousal/Alertness: Awake/alert Behavior During Therapy: WFL for tasks assessed/performed Overall  Cognitive Status: Within Functional Limits for tasks assessed                                          Exercises      General Comments        Pertinent Vitals/Pain Pain Assessment Pain Assessment: Faces Faces Pain Scale: Hurts even more Pain Location: no pain on R hip with rest, mild to moderate with mobility Pain Descriptors / Indicators: Guarding, Grimacing Pain Intervention(s): Limited activity within patient's tolerance, Monitored during session, Repositioned, Ice applied    Home Living Family/patient expects to be discharged to:: Inpatient rehab                   Additional Comments: Pt was independent [place] at [place]    Prior Function            PT Goals (current goals can now be found in the care plan section) Acute Rehab PT Goals Patient Stated Goal: to walk and get stronger PT Goal Formulation: With patient/family Time For Goal Achievement: 09/07/22 Potential to Achieve Goals: Good Progress towards PT goals: Progressing toward goals    Frequency    Min 4X/week      PT Plan Current plan remains appropriate    Co-evaluation              AM-PAC PT "6 Clicks" Mobility   Outcome Measure  Help needed turning from your back to your side while in a flat bed without using bedrails?: A Little Help needed moving from lying on your back to sitting on the side of a flat bed without using bedrails?: A Little Help needed moving to and from a bed to a chair (including a wheelchair)?: A Little Help needed standing up from a chair using your arms (e.g., wheelchair or bedside chair)?: A Little Help needed to walk in hospital room?: Total Help needed climbing 3-5 steps with a railing? : Total 6 Click Score: 14    End of Session Equipment Utilized During Treatment: Gait belt Activity Tolerance: Patient limited by pain Patient left: in chair;with call bell/phone within reach Nurse Communication: Mobility status PT Visit  Diagnosis: Unsteadiness on feet (R26.81);Muscle weakness (generalized) (M62.81);Pain Pain - Right/Left: Right Pain - part of body: Hip     Time: [id number] PT Time Calculation (min) (ACUTE ONLY): 28 min  Charges:  $Gait Training: 8-22 mins $Therapeutic Activity: 8-22 mins                     Candie Mile, PT, DPT Physical Therapist Acute Rehabilitation Services [place]    Ellouise Newer 08/25/2022, 11:06 AM

## 2022-08-25 NOTE — Progress Notes (Signed)
ORTHOPAEDIC PROGRESS NOTE  s/p Procedure(s): CANNULATED HIP PINNING on 08/23/22 with Dr. Marcelino Scot  SUBJECTIVE: Reports moderate pain about operative site. Did not sleep well last night  OBJECTIVE: PE: General: sitting up in recliner, NAD RLE: incision CDI, intact EHL/TA/GSC, leg lengths equal, warm well perfused foot   Vitals:   08/25/22 0821 08/25/22 0822  BP: (!) 123/56   Pulse: 69   Resp:    Temp: 98 F (36.7 C)   SpO2: 97% 97%     ASSESSMENT: Dana Morrison is a 89 y.o. female POD#2  PLAN: Weightbearing: WBAT RLE Insicional and dressing care: Reinforce dressings as needed Orthopedic device(s): None Showering: Hold for now VTE prophylaxis: Eliquis Pain control: PRN pain medications, minimize narcotics as able Follow - up plan: 2 weeks in office for suture removal and x-rays Dispo: PT/OT recommending SNF. TOC following.   Contact information: After hours and holidays please check [email or web address] for group call information for [facility]   Noemi Chapel, PA-C 08/25/2022

## 2022-08-25 NOTE — Evaluation (Signed)
Occupational Therapy Evaluation Patient Details Name: Dana Morrison MRN: [id number] DOB: 01/07/1924 Today's Date: 08/25/2022   History of Present Illness 89 yo female with [place] was [place] on 2/13 for evaluation of R impacted femoral neck fracture, with cannulated screws for ORIF.  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 Upon eval, pt presents with pain, decreased balance, safety, strength, and knowledge of use of AE/DME. Pt performing UB ADL with up to max A and UB ADL with set-up A. Pt very motivated to return to PLOF. Due to pt support, significant change in functional status, and motivation, recommending discharge to AIR for multidisciplinary rehabilitation to optimize safety and independence in ADL and IADL.      Recommendations for follow up therapy are one component of a multi-disciplinary discharge planning process, led by the attending physician.  Recommendations may be updated based on patient status, additional functional criteria and insurance authorization.   Follow Up Recommendations  Acute inpatient rehab (3hours/day)     Assistance Recommended at Discharge Frequent or constant Supervision/Assistance  Patient can return home with the following A little help with walking and/or transfers;A lot of help with bathing/dressing/bathroom;Help with stairs or ramp for entrance;Assist for transportation;Assistance with cooking/housework    Functional Status Assessment  Patient has had a recent decline in their functional status and demonstrates the ability to make significant improvements in function in a reasonable and predictable amount of time.  Equipment Recommendations  Other (comment) (defer)    Recommendations  for Other Services Rehab consult     Precautions / Restrictions Precautions Precautions: Fall Precaution Comments: HOH Restrictions Weight Bearing Restrictions: Yes RLE Weight Bearing: Weight bearing as tolerated      Mobility Bed Mobility               General bed mobility comments: in chair on arrival and departure    Transfers Overall transfer level: Needs assistance Equipment used: Rolling walker (2 wheels), 1 person hand held assist Transfers: Sit to/from Stand Sit to Stand: Min assist           General transfer comment: Min A for steadying and  light lift assist from non-elevated surfaces      Balance Overall balance assessment: Needs assistance Sitting-balance support: Feet supported Sitting balance-Leahy Scale: Fair     Standing balance support: Bilateral upper extremity supported, During functional activity Standing balance-Leahy Scale: Poor                             ADL either performed or assessed with clinical judgement   ADL Overall ADL's : Needs assistance/impaired Eating/Feeding: Independent;Bed level   Grooming: Minimal assistance;Standing Grooming Details (indicate cue type and reason): MIn A for standing balance Upper Body Bathing: Set up;Sitting   Lower Body Bathing: Moderate assistance;Sit to/from stand   Upper Body Dressing : Set up;Sitting   Lower Body Dressing: Maximal assistance;Sit to/from stand Lower Body Dressing Details (indicate cue type and reason): unable to reach feet at this time to don socks. Unaware of AE options Toilet Transfer: Minimal assistance;Ambulation;Rolling walker (2 wheels) Toilet Transfer Details (indicate cue type and reason): simulated in room Toileting- Clothing Manipulation and Hygiene: Min guard;Sitting/lateral lean  Functional mobility during ADLs: Minimal assistance;Rolling walker (2 wheels)       Vision Baseline Vision/History: 1 Wears glasses Ability to See in Adequate  Light: 0 Adequate Patient Visual Report: No change from baseline Vision Assessment?: No apparent visual deficits Additional Comments: using iPad on arrival     Perception Perception Perception Tested?: No   Praxis Praxis Praxis tested?: Not tested    Pertinent Vitals/Pain Pain Assessment Pain Assessment: Faces Faces Pain Scale: Hurts even more Pain Location: no pain on R hip with rest, mild to moderate with mobility Pain Descriptors / Indicators: Guarding, Grimacing Pain Intervention(s): Limited activity within patient's tolerance, Monitored during session, Repositioned     Hand Dominance Right   Extremity/Trunk Assessment Upper Extremity Assessment Upper Extremity Assessment: Generalized weakness   Lower Extremity Assessment Lower Extremity Assessment: Defer to PT evaluation   Cervical / Trunk Assessment Cervical / Trunk Assessment: Kyphotic   Communication Communication Communication: HOH (compensates well with incr volume from speaker)   Cognition Arousal/Alertness: Awake/alert Behavior During Therapy: WFL for tasks assessed/performed Overall Cognitive Status: Within Functional Limits for tasks assessed                                 General Comments: Pt following all commands with incresaed time. MD stating happy saturday, and pt stating "oh, that's right, it's the weekend"     General Comments       Exercises     Shoulder Instructions      Home Living Family/patient expects to be discharged to:: Inpatient rehab                                 Additional Comments: Pt was independent on [place]ator [place] at [facility]      Prior Functioning/Environment Prior Level of Function : Independent/Modified Independent             Mobility Comments: rollator with no assist in [place] situation ADLs Comments: I per pt        OT Problem List: Decreased strength;Decreased activity tolerance;Impaired balance (sitting and/or  standing);Decreased knowledge of use of DME or AE;Decreased knowledge of precautions;Decreased safety awareness;Pain      OT Treatment/Interventions: Self-care/ADL training;Therapeutic exercise;DME and/or AE instruction;Balance training;Patient/family education;Therapeutic activities    OT Goals(Current goals can be found in the care plan section) Acute Rehab OT Goals Patient Stated Goal: get better OT Goal Formulation: With patient Time For Goal Achievement: 09/08/22 Potential to Achieve Goals: Good  OT Frequency: Min 2X/week    Co-evaluation              AM-PAC OT "6 Clicks" Daily Activity     Outcome Measure Help from another person eating meals?: None Help from another person taking care of personal grooming?: A Little Help from another person toileting, which includes using toliet, bedpan, or urinal?: A Little Help from another person bathing (including washing, rinsing, drying)?: A Little Help from another person to put on and taking off regular upper body clothing?: A Little Help from another person to put on and taking off regular lower body clothing?: A Little 6 Click Score: 19   End of Session Equipment Utilized During Treatment: Gait belt;Rolling walker (2 wheels) Nurse Communication: Mobility status (no chair alarm on on entry)  Activity Tolerance: Patient tolerated treatment well Patient left: in chair;with call bell/phone within reach  OT Visit  Diagnosis: Unsteadiness on feet (R26.81);Muscle weakness (generalized) (M62.81);Pain Pain - Right/Left: Right Pain - part of body: Hip                Time: [id number] OT Time Calculation (min): 18 min Charges:  OT General Charges $OT Visit: 1 Visit OT Evaluation $OT Eval Low Complexity: 1 Low  Elder Cyphers, OTR/L [facility] Acute Rehabilitation Office: [phone]   Magnus Ivan 08/25/2022, 10:21 AM

## 2022-08-25 NOTE — Progress Notes (Signed)
PROGRESS NOTE    Dana Morrison  [id number] DOB: Oct 03, 1923 DOA: 08/21/2022 PCP: Sueanne Margarita, DO    Brief Narrative:  89 year old ambulatory female from home with history of hypertension, coronary artery disease, paroxysmal A-fib on Eliquis, sick sinus syndrome status post pacemaker, nocturnal oxygen dependent presented with mechanical fall and right hip fracture.  Last dose of Eliquis was 2/13 morning.  In the emergency room hemodynamically stable.  CT scan right femur with nondisplaced transcervical right femoral neck fracture.  Admitted with orthopedic consultation.   Assessment & Plan:   Closed traumatic right hip fracture: ORIF with IM nail 2/15.  Dr. Marcelino Scot.   Adequate pain medication, oral and IV opiates along with muscle relaxants and bowel regimen. Weightbearing as tolerated right lower extremity. DVT prophylaxis with Eliquis.  Resume today. Will likely need short-term rehab before going back to her apartment. Appropriate for acute inpatient rehab.  Pending insurance authorization.  Chronic medical issues including  paroxysmal A-fib: Continue metoprolol and Multaq.  Currently in sinus rhythm.  Resume Eliquis. Chronic diastolic heart failure: Appears compensated. Coronary artery disease, currently compensated.  Resume aspirin on discharge. Chronic bronchitis and nocturnal hypoxemia: On 2 L oxygen at night.  Continue bronchodilator as needed along with steroid inhalers. Electrolytes, potassium replaced and adequate.     DVT prophylaxis: SCDs Start: 08/21/22 2358 apixaban (ELIQUIS) tablet 5 mg   Code Status: Full code Family Communication: None today. Disposition Plan: Status is: Inpatient Remains inpatient appropriate because: Pending rehab.     Consultants:  Orthopedics  Procedures:  None  Antimicrobials:  None   Subjective:  Patient seen and examined.  Working with occupational therapist.  She had episodes of severe pain on her right hip and was  waiting for it.  Currently pain is controlled.  Encouraged to use oral pain medication along with IV pain medication.  Waiting for rehab determination.  Objective: Vitals:   08/24/22 2126 08/25/22 0523 08/25/22 0821 08/25/22 0822  BP: (!) 113/59 100/65 (!) 123/56   Pulse: 71 68 69   Resp: 18 19 18   $ Temp: 98.1 F (36.7 C) 97.9 F (36.6 C) 98 F (36.7 C)   TempSrc: Oral Oral Oral   SpO2: 98% 96% 97% 97%  Weight:      Height:       No intake or output data in the 24 hours ending 08/25/22 1213  Filed Weights   08/21/22 1952 08/22/22 1458  Weight: 87.5 kg 87.5 kg    Examination:  General exam: Mildly anxious this.  Otherwise calm and comfortable. Respiratory system: Clear to auscultation. Respiratory effort normal.  On 2 L oxygen.  No added sounds. Cardiovascular system: S1 & S2 heard, RRR.  Pacemaker in place.   Gastrointestinal system: Abdomen is nondistended, soft and nontender. No organomegaly or masses felt. Normal bowel sounds heard. Central nervous system: Alert and oriented. No focal neurological deficits. Extremities: Right hip, mild swelling, no palpable tenderness or erythema.    Data Reviewed: I have personally reviewed following labs and imaging studies  CBC: Recent Labs  Lab 08/21/22 2021 08/22/22 0430 08/23/22 0100 08/24/22 0422 08/25/22 0208  WBC 11.2* 7.5 7.2 8.3 7.5  NEUTROABS 9.0*  --   --   --   --   HGB 11.4* 10.4* 10.3* 10.2* 10.1*  HCT 35.9* 33.2* 32.3* 31.1* 31.6*  MCV 86.5 86.5 86.6 85.0 85.4  PLT 196 170 148* 142* [id number]    Basic Metabolic Panel: Recent Labs  Lab 08/21/22 2021 08/22/22 0430 08/23/22 0100  08/24/22 0422 08/25/22 0208  NA 141 139 137 136 135  K 3.4* 4.2 4.2 4.2 4.6  CL 106 105 105 102 103  CO2 25 25 26 26 27  $ GLUCOSE 104* 102* 114* 140* 118*  BUN 31* 28* 23 26* 31*  CREATININE 1.00 0.92 0.87 0.89 1.15*  CALCIUM 8.4* 8.0* 8.0* 8.1* 7.9*  MG  --  2.1  --   --   --     GFR: Estimated Creatinine Clearance: 31.9  mL/min (A) (by C-G formula based on SCr of 1.15 mg/dL (H)). Liver Function Tests: No results for input(s): "AST", "ALT", "ALKPHOS", "BILITOT", "PROT", "ALBUMIN" in the last 168 hours. No results for input(s): "LIPASE", "AMYLASE" in the last 168 hours. No results for input(s): "AMMONIA" in the last 168 hours. Coagulation Profile: No results for input(s): "INR", "PROTIME" in the last 168 hours. Cardiac Enzymes: No results for input(s): "CKTOTAL", "CKMB", "CKMBINDEX", "TROPONINI" in the last 168 hours. BNP (last 3 results) No results for input(s): "PROBNP" in the last 8760 hours. HbA1C: No results for input(s): "HGBA1C" in the last 72 hours. CBG: Recent Labs  Lab 08/23/22 2116 08/24/22 0411  GLUCAP 145* 137*    Lipid Profile: No results for input(s): "CHOL", "HDL", "LDLCALC", "TRIG", "CHOLHDL", "LDLDIRECT" in the last 72 hours. Thyroid Function Tests: No results for input(s): "TSH", "T4TOTAL", "FREET4", "T3FREE", "THYROIDAB" in the last 72 hours. Anemia Panel: No results for input(s): "VITAMINB12", "FOLATE", "FERRITIN", "TIBC", "IRON", "RETICCTPCT" in the last 72 hours. Sepsis Labs: No results for input(s): "PROCALCITON", "LATICACIDVEN" in the last 168 hours.  Recent Results (from the past 240 hour(s))  Surgical pcr screen     Status: None   Collection Time: 08/23/22  7:37 AM   Specimen: Nasal Mucosa; Nasal Swab  Result Value Ref Range Status   MRSA, PCR NEGATIVE NEGATIVE Final   Staphylococcus aureus NEGATIVE NEGATIVE Final    Comment: (NOTE) The Xpert SA Assay (FDA approved for NASAL specimens in patients 38 years of age and older), is one component of a comprehensive surveillance program. It is not intended to diagnose infection nor to guide or monitor treatment. Performed at [facility], [place] [address] [place], [place] [address]          Radiology Studies: No results found.      Scheduled Meds:  acetaminophen  1,000 mg Oral Q8H   apixaban  5 mg  Oral BID   dronedarone  400 mg Oral BID WC   feeding supplement  237 mL Oral BID BM   isosorbide mononitrate  30 mg Oral Daily   ketorolac  7.5 mg Intravenous Q8H   loperamide  2 mg Oral TID AC   metoprolol succinate  25 mg Oral Daily   mometasone-formoterol  2 puff Inhalation BID   multivitamin with minerals  1 tablet Oral Daily   pantoprazole  40 mg Oral Daily   pravastatin  20 mg Oral Daily   Continuous Infusions:  methocarbamol (ROBAXIN) IV       LOS: 4 days    Time spent: 35 minutes    Barb Merino, MD [facility] Pager [phone]

## 2022-08-25 NOTE — Consult Note (Signed)
Physical Medicine and Rehabilitation Consult Reason for Consult: Assess candidacy for CIR Referring Physician: Fredda Hammed, MD   HPI: Dana Morrison is a 89 y.o. female with a history of HTN, CAD, paroxysmal afib on Eliquis, sick sinus syndrome s/p pacemaker, nocturnal oxygen dependence, who presented after a mechanical fall resulting in a right nondisplaced femoral neck fracture, s/p ORIF with IM nail on 2/15 by Dr. Marcelino Scot. Physical Medicine & Rehabilitation was consulted to assess candidacy for CIR.     Review of Systems  Constitutional: Negative.   Eyes: Negative.   Cardiovascular: Negative.   Skin: Negative.    Past Medical History:  Diagnosis Date   Atrial fibrillation ([facility])    Chronic diastolic heart failure ([facility]) 09/12/2017   Congestive heart failure (CHF) ([facility])    estimated ejection fraction is [id number]; diastolic   Coronary artery disease    Decreased vision 09/12/2017   Hx of myocardial infarction 2010   Hypertension    Osteoarthritis of hip 09/12/2017   Osteoarthritis of knee 09/12/2017   Paroxysmal atrial fibrillation ([facility]) 09/12/2017   Scalp psoriasis 09/12/2017   Seasonal allergic rhinitis due to pollen 09/12/2017   Urticaria 09/12/2017   Venous insufficiency 09/12/2017   Past Surgical History:  Procedure Laterality Date   APPENDECTOMY     ARM FRACTURE Left    FRACTURE SURGERY Bilateral    WRISTS   HIP PINNING,CANNULATED Right 08/23/2022   Procedure: CANNULATED HIP PINNING;  Surgeon: Altamese Oxford, MD;  Location: [facility];  Service: Orthopedics;  Laterality: Right;   LAPAROSCOPIC LYSIS OF ADHESIONS     PACEMAKER INSERTION     PLACEMENT OF STENTS     X5   REPLACEMENT TOTAL KNEE     REVISION TOTAL HIP ARTHROPLASTY Left    Family History  Problem Relation Age of Onset   Microcephaly Mother    Heart attack Mother    Heart attack Father    Lung cancer Brother    Other Brother        BACK PROBLEMS   Healthy Daughter    Healthy Daughter    Healthy Daughter    Healthy  Son    Social History:  reports that she quit smoking about 27 years ago. Her smoking use included cigarettes. She has a 3.75 pack-year smoking history. She has never used smokeless tobacco. She reports current alcohol use. She reports that she does not use drugs. Allergies:  Allergies  Allergen Reactions   Psyllium Diarrhea   Other     Per patient she has an intolerance to some legumes. She does not have a true allergy. Pt's son confirmed this as well.    Medications Prior to Admission  Medication Sig Dispense Refill   apixaban (ELIQUIS) 5 MG TABS tablet Take 1 tablet (5 mg total) by mouth 2 (two) times daily. 60 tablet 5   aspirin 81 MG chewable tablet Chew 1 tablet by mouth daily.     bumetanide (BUMEX) 1 MG tablet Take 1 tablet (1 mg total) by mouth daily. 90 tablet 1   fluticasone-salmeterol (WIXELA INHUB) 100-50 MCG/ACT AEPB Inhale 1 puff into the lungs 2 (two) times daily. 60 each 5   isosorbide mononitrate (IMDUR) 30 MG 24 hr tablet TAKE 1 TABLET EVERY DAY (Patient taking differently: Take 30 mg by mouth daily.) 90 tablet 2   metoprolol succinate (TOPROL-XL) 25 MG 24 hr tablet TAKE 1 TABLET EVERY DAY (Patient taking differently: Take 25 mg by mouth daily.) 90 tablet 2  MULTAQ 400 MG tablet TAKE 1 TABLET TWICE DAILY WITH MEALS 180 tablet 3   Multiple Vitamin (MULTIVITAMIN) capsule Take 1 capsule by mouth daily.     pravastatin (PRAVACHOL) 20 MG tablet TAKE 1 TABLET EVERY DAY (NEED MD APPOINTMENT) (Patient taking differently: Take 20 mg by mouth daily.) 90 tablet 1   nitroGLYCERIN (NITROSTAT) 0.4 MG SL tablet Place 0.4 mg under the tongue every 5 (five) minutes as needed for chest pain.      Home: Home Living Family/patient expects to be discharged to:: Inpatient rehab Additional Comments: Pt was independent on Rollator in [place] at [facility] History: Prior Function Prior Level of Function : Independent/Modified Independent Mobility Comments: rollator with no assist in  [place] situation ADLs Comments: I per pt Functional Status:  Mobility: Bed Mobility Overal bed mobility: Needs Assistance Bed Mobility: Supine to Sit Supine to sit: Min assist, HOB elevated General bed mobility comments: Very light min assist for RLE support out of bed. Cues for technique. Able to use rail and press self into seated position. Transfers Overall transfer level: Needs assistance Equipment used: Rolling walker (2 wheels) Transfers: Sit to/from Stand Sit to Stand: Min assist General transfer comment: Min assist for boost to stand. Practiced x2 from bed. First round pt with urinary incontinence. Cues for technique and hand placement. Ambulation/Gait Ambulation/Gait assistance: Min assist Gait Distance (Feet): 30 Feet Assistive device: Rolling walker (2 wheels) Gait Pattern/deviations: Step-to pattern, Step-through pattern, Decreased stride length, Decreased stance time - right, Trunk flexed, Antalgic General Gait Details: RW adjusted for improve leverage. Cues for upright posture and larger step length to improve symmetry and efficency of gait. No buckling noted however limited WB tolerance through RLE. Min assist through second half of distance for upright posture and RW advancement. Gait velocity: reduced Gait velocity interpretation: <1.31 ft/sec, indicative of household ambulator Pre-gait activities: standing balance ck    ADL: ADL Overall ADL's : Needs assistance/impaired Eating/Feeding: Independent, Bed level Grooming: Minimal assistance, Standing Grooming Details (indicate cue type and reason): MIn A for standing balance Upper Body Bathing: Set up, Sitting Lower Body Bathing: Moderate assistance, Sit to/from stand Upper Body Dressing : Set up, Sitting Lower Body Dressing: Maximal assistance, Sit to/from stand Lower Body Dressing Details (indicate cue type and reason): unable to reach feet at this time to don socks. Unaware of AE options Toilet Transfer: Minimal  assistance, Ambulation, Rolling walker (2 wheels) Toilet Transfer Details (indicate cue type and reason): simulated in room Toileting- Clothing Manipulation and Hygiene: Min guard, Sitting/lateral lean Functional mobility during ADLs: Minimal assistance, Rolling walker (2 wheels)  Cognition: Cognition Overall Cognitive Status: Within Functional Limits for tasks assessed Orientation Level: Oriented X4 Cognition Arousal/Alertness: Awake/alert Behavior During Therapy: WFL for tasks assessed/performed Overall Cognitive Status: Within Functional Limits for tasks assessed General Comments: Pt following all commands with incresaed time. MD stating happy saturday, and pt stating "oh, that's right, it's the weekend"  Blood pressure (!) 112/54, pulse 75, temperature 98.6 F (37 C), temperature source Oral, resp. rate 18, height 5' 5.98" (1.676 m), weight 87.5 kg, SpO2 96 %. Physical Exam Gen: no distress, normal appearing HEENT: oral mucosa pink and moist, NCAT Cardio: Reg rate Chest: normal effort, normal rate of breathing Abd: soft, non-distended Ext: no edema Psych: pleasant, normal affect Skin: intact Neuro: Alert and oriented x3 MSK; incision is c/d/I, foot is well perfused  Results for orders placed or performed during the hospital encounter of 08/21/22 (from the past 24 hour(s))  Basic  metabolic panel     Status: Abnormal   Collection Time: 08/25/22  2:08 AM  Result Value Ref Range   Sodium 135 135 - 145 mmol/L   Potassium 4.6 3.5 - 5.1 mmol/L   Chloride 103 98 - 111 mmol/L   CO2 27 22 - 32 mmol/L   Glucose, Bld 118 (H) 70 - 99 mg/dL   BUN 31 (H) 8 - 23 mg/dL   Creatinine, Ser 1.15 (H) 0.44 - 1.00 mg/dL   Calcium 7.9 (L) 8.9 - 10.3 mg/dL   GFR, Estimated 44 (L) >60 mL/min   Anion gap 5 5 - 15  CBC     Status: Abnormal   Collection Time: 08/25/22  2:08 AM  Result Value Ref Range   WBC 7.5 4.0 - 10.5 K/uL   RBC 3.70 (L) 3.87 - 5.11 MIL/uL   Hemoglobin 10.1 (L) 12.0 - 15.0  g/dL   HCT 31.6 (L) 36.0 - 46.0 %   MCV 85.4 80.0 - 100.0 fL   MCH 27.3 26.0 - 34.0 pg   MCHC 32.0 30.0 - 36.0 g/dL   RDW 15.0 11.5 - 15.5 %   Platelets 166 150 - 400 K/uL   nRBC 0.0 0.0 - 0.2 %   No results found.   Assessment/Plan: Diagnosis: Right femoral neck nondisplaced fracture Does the need for close, 24 hr/day medical supervision in concert with the patient's rehab needs make it unreasonable for this patient to be served in a less intensive setting? Yes Co-Morbidities requiring supervision/potential complications:  Hypotension: consider decreasing imdur Screening for Vitamin D deficiency: consider checking vitamin D level Impaired fasting blood glucose: consider checking HgbA1c AKI hypocalcemia Due to bladder management, bowel management, safety, skin/wound care, disease management, medication administration, pain management, and patient education, does the patient require 24 hr/day rehab nursing? Yes Does the patient require coordinated care of a physician, rehab nurse, therapy disciplines of PT, OT to address physical and functional deficits in the context of the above medical diagnosis(es)? Yes Addressing deficits in the following areas: balance, endurance, locomotion, strength, transferring, bowel/bladder control, bathing, dressing, feeding, grooming, and toileting Can the patient actively participate in an intensive therapy program of at least 3 hrs of therapy per day at least 5 days per week? Yes The potential for patient to make measurable gains while on inpatient rehab is excellent Anticipated functional outcomes upon discharge from inpatient rehab are supervision  with PT, supervision with OT, independent with SLP. Estimated rehab length of stay to reach the above functional goals is: 7-10 days Anticipated discharge destination: Home Overall Rehab/Functional Prognosis: excellent  RECOMMENDATIONS: This patient's condition is appropriate for continued rehabilitative  care in the following setting: CIR Patient has agreed to participate in recommended program. Yes Note that insurance prior authorization may be required for reimbursement for recommended care.   Izora Ribas, MD 08/25/2022

## 2022-08-26 DIAGNOSIS — S72001A Fracture of unspecified part of neck of right femur, initial encounter for closed fracture: Secondary | ICD-10-CM | POA: Diagnosis not present

## 2022-08-26 NOTE — PMR Pre-admission (Shared)
PMR Admission Coordinator Pre-Admission Assessment  Patient: Dana Morrison is an 89 y.o., female MRN: [id number] DOB: 09/07/1923 Height: 5' 5.98" (167.6 cm) Weight: 87.5 kg  Insurance Information HMO: ***    PPO: ***     PCP: ***     IPA: ***     80/20: ***     OTHER: *** PRIMARY: [employer]      Policy#: [id number]      Subscriber: *** CM Name: ***      Phone#: ***     Fax#: *** Pre-Cert#: [id number]      Employer: *** Benefits:  Phone #: ***     Name: *** Eff. Date: ***     Deduct: ***      Out of Pocket Max: ***      Life Max: *** CIR: ***      SNF: *** Outpatient: ***     Co-Pay: *** Home Health: ***      Co-Pay: *** DME: ***     Co-Pay: *** Providers: *** SECONDARY: ***      Policy#: ***     Phone#: ***  Financial Counselor: ***      Phone#: ***  The "Data Collection Information Summary" for patients in Inpatient Rehabilitation Facilities with attached "Privacy Act [place] Records" was provided and verbally reviewed with: Patient  Emergency Contact Information Contact Information     Name Relation Home Work [place], [place] Son   [phone]   [place], Streeval Relative   [phone]       Current Medical History  Patient Admitting Diagnosis: Hip fracture  History of Present Illness: Dana Morrison is a 89 y.o. female with a history of HTN, CAD, paroxysmal afib on Eliquis, sick sinus syndrome s/p pacemaker, nocturnal oxygen dependence, who presented to [facility] ED 08/23/22  after a mechanical fall resulting in a right nondisplaced femoral neck fracture, s/p ORIF with IM nail on 2/15 by Dr. Marcelino Scot. Pt. Seen by PT/OT post op and they recommended CIR to assist return to PLOF.     Patient's medical record from [facility] ED  has been reviewed by the rehabilitation admission coordinator and physician.  Past Medical History  Past Medical History:  Diagnosis Date   Atrial fibrillation ([place])    Chronic diastolic heart failure ([place]) 09/12/2017    Congestive heart failure (CHF) (HCC)    estimated ejection fraction is [id number]; diastolic   Coronary artery disease    Decreased vision 09/12/2017   Hx of myocardial infarction 2010   Hypertension    Osteoarthritis of hip 09/12/2017   Osteoarthritis of knee 09/12/2017   Paroxysmal atrial fibrillation (HCC) 09/12/2017   Scalp psoriasis 09/12/2017   Seasonal allergic rhinitis due to pollen 09/12/2017   Urticaria 09/12/2017   Venous insufficiency 09/12/2017    Has the patient had major surgery during 100 days prior to admission? {Yes/No/Unknown:304600602}  Family History   family history includes Healthy in her daughter, daughter, daughter, and son; Heart attack in her father and mother; Lung cancer in her brother; Microcephaly in her mother; Other in her brother.  Current Medications  Current Facility-Administered Medications:    acetaminophen (TYLENOL) tablet 1,000 mg, 1,000 mg, Oral, Q8H, Ainsley Spinner, PA-C, 1,000 mg at 08/26/22 [id number]   apixaban (ELIQUIS) tablet 5 mg, 5 mg, Oral, BID, Ghimire, Kuber, MD, 5 mg at 08/26/22 0933   dronedarone (MULTAQ) tablet 400 mg, 400 mg, Oral, BID WC, Ainsley Spinner, PA-C, 400 mg at 08/26/22 0934   feeding supplement (  ENSURE ENLIVE / ENSURE PLUS) liquid 237 mL, 237 mL, Oral, BID BM, Barb Merino, MD, 237 mL at 08/26/22 0934   ipratropium-albuterol (DUONEB) 0.5-2.5 (3) MG/3ML nebulizer solution 3 mL, 3 mL, Nebulization, Q4H PRN, Ainsley Spinner, PA-C   isosorbide mononitrate (IMDUR) 24 hr tablet 30 mg, 30 mg, Oral, Daily, Ainsley Spinner, PA-C, 30 mg at 08/26/22 [id number]   loperamide (IMODIUM) capsule 2 mg, 2 mg, Oral, TID Eldridge Dace, PA-C, 2 mg at 08/26/22 1106   methocarbamol (ROBAXIN) tablet 500 mg, 500 mg, Oral, Q6H PRN, 500 mg at 08/26/22 0354 **OR** methocarbamol (ROBAXIN) 500 mg in dextrose 5 % 50 mL IVPB, 500 mg, Intravenous, Q6H PRN, Ainsley Spinner, PA-C   metoprolol succinate (TOPROL-XL) 24 hr tablet 25 mg, 25 mg, Oral, Daily, Ainsley Spinner, PA-C, 25 mg at 08/26/22 0933    mometasone-formoterol (DULERA) 100-5 MCG/ACT inhaler 2 puff, 2 puff, Inhalation, BID, Ainsley Spinner, PA-C, 2 puff at 08/26/22 0934   morphine (PF) 2 MG/ML injection 2 mg, 2 mg, Intravenous, Q2H PRN, Ainsley Spinner, PA-C, 2 mg at 08/25/22 0304   multivitamin with minerals tablet 1 tablet, 1 tablet, Oral, Daily, Ghimire, Dante Gang, MD, 1 tablet at 08/26/22 0934   oxyCODONE (Oxy IR/ROXICODONE) immediate release tablet 5 mg, 5 mg, Oral, Q4H PRN, Ainsley Spinner, PA-C, 5 mg at 08/26/22 1106   pantoprazole (PROTONIX) EC tablet 40 mg, 40 mg, Oral, Daily, Ghimire, Kuber, MD, 40 mg at 08/26/22 0933   pravastatin (PRAVACHOL) tablet 20 mg, 20 mg, Oral, Daily, Ainsley Spinner, PA-C, 20 mg at 08/26/22 [id number]   senna-docusate (Senokot-S) tablet 1 tablet, 1 tablet, Oral, QHS PRN, Ainsley Spinner, PA-C  Facility-Administered Medications Ordered in Other Encounters:    bupivacaine-epinephrine (PF) (MARCAINE W/ EPI) 0.5% -1:200000 injection, , Peri-NEURAL, Anesthesia Intra-op, Audry Pili, MD, 25 mL at 08/22/22 1256  Patients Current Diet:  Diet Order             Diet regular Fluid consistency: Thin  Diet effective now                   Precautions / Restrictions Precautions Precautions: Fall Precaution Comments: HOH Restrictions Weight Bearing Restrictions: Yes RLE Weight Bearing: Weight bearing as tolerated   Has the patient had 2 or more falls or a fall with injury in the past year? Yes  Prior Activity Level Community (5-7x/wk): Pt. actice in the community PTA  Prior Functional Level Self Care: Did the patient need help bathing, dressing, using the toilet or eating? Independent  Indoor Mobility: Did the patient need assistance with walking from room to room (with or without device)? Independent  Stairs: Did the patient need assistance with internal or external stairs (with or without device)? Independent  Functional Cognition: Did the patient need help planning regular tasks such as shopping or remembering  to take medications? Independent  Patient Information Are you of Hispanic, Latino/a,or Spanish origin?: A. No, not of Hispanic, Latino/a, or Spanish origin What is your race?: A. White Do you need or want an interpreter to communicate with a doctor or health care staff?: 0. No  Patient's Response To:  Health Literacy and Transportation Is the patient able to respond to health literacy and transportation needs?: Yes Health Literacy - How often do you need to have someone help you when you read instructions, pamphlets, or other written material from your doctor or pharmacy?: Never In the past 12 months, has lack of transportation kept you from medical appointments or from getting medications?: No  In the past 12 months, has lack of transportation kept you from meetings, work, or from getting things needed for daily living?: No  [place] Devices/Equipment: Environmental consultant (specify type)  Prior Device Use: Indicate devices/aids used by the patient prior to current illness, exacerbation or injury? Walker  Current Functional Level Cognition  Overall Cognitive Status: Within Functional Limits for tasks assessed Orientation Level: Oriented X4 General Comments: Pt following all commands with incresaed time. MD stating happy saturday, and pt stating "oh, that's right, it's the weekend"    Extremity Assessment (includes Sensation/Coordination)  Upper Extremity Assessment: Generalized weakness  Lower Extremity Assessment: Defer to PT evaluation RLE Deficits / Details: new ORIF with hip weakness RLE Coordination: decreased gross motor    ADLs  Overall ADL's : Needs assistance/impaired Eating/Feeding: Independent, Bed level Grooming: Minimal assistance, Standing Grooming Details (indicate cue type and reason): MIn A for standing balance Upper Body Bathing: Set up, Sitting Lower Body Bathing: Moderate assistance, Sit to/from stand Upper Body Dressing : Set up,  Sitting Lower Body Dressing: Maximal assistance, Sit to/from stand Lower Body Dressing Details (indicate cue type and reason): unable to reach feet at this time to don socks. Unaware of AE options Toilet Transfer: Minimal assistance, Ambulation, Rolling walker (2 wheels) Toilet Transfer Details (indicate cue type and reason): simulated in room Toileting- Clothing Manipulation and Hygiene: Min guard, Sitting/lateral lean Functional mobility during ADLs: Minimal assistance, Rolling walker (2 wheels)    Mobility  Overal bed mobility: Needs Assistance Bed Mobility: Supine to Sit Supine to sit: Min assist, HOB elevated General bed mobility comments: Very light min assist for RLE support out of bed. Cues for technique. Able to use rail and press self into seated position.    Transfers  Overall transfer level: Needs assistance Equipment used: Rolling walker (2 wheels) Transfers: Sit to/from Stand Sit to Stand: Min assist General transfer comment: Min assist for boost to stand. Practiced x2 from bed. First round pt with urinary incontinence. Cues for technique and hand placement.    Ambulation / Gait / Stairs / Wheelchair Mobility  Ambulation/Gait Ambulation/Gait assistance: Herbalist (Feet): 30 Feet Assistive device: Rolling walker (2 wheels) Gait Pattern/deviations: Step-to pattern, Step-through pattern, Decreased stride length, Decreased stance time - right, Trunk flexed, Antalgic General Gait Details: RW adjusted for improve leverage. Cues for upright posture and larger step length to improve symmetry and efficency of gait. No buckling noted however limited WB tolerance through RLE. Min assist through second half of distance for upright posture and RW advancement. Gait velocity: reduced Gait velocity interpretation: <1.31 ft/sec, indicative of household ambulator Pre-gait activities: standing balance ck    Posture / Balance Balance Overall balance assessment: Needs  assistance Sitting-balance support: Feet supported Sitting balance-Leahy Scale: Fair Standing balance support: Bilateral upper extremity supported, During functional activity Standing balance-Leahy Scale: Poor    Special needs/care consideration Skin surgical site   Previous Home Environment (from acute therapy documentation) Living Arrangements: Alone, Spouse/significant other, Other (Comment)  Lives With: Alone, Other (Comment) (In [facility]) Available Help at Discharge: Family Type of Home: Apartment Home Layout: One level Home Access: Level entry Bathroom Shower/Tub: Multimedia programmer: Handicapped height Bathroom Accessibility: Yes How Accessible: Accessible via walker, Accessible via wheelchair [place]: No Additional Comments: Pt was independent on [place] in [place] at [facility]  Discharge Living Setting Plans for Discharge Living Setting: Patient's home Type of Home at Discharge: House Discharge Home Layout: One level Discharge  Home Access: Level entry Discharge Bathroom Shower/Tub: Tub/shower unit Discharge Bathroom Toilet: Handicapped height Discharge Bathroom Accessibility: Yes How Accessible: Accessible via walker, Accessible via wheelchair Does the patient have any problems obtaining your medications?: No  Social/Family/Support Systems Patient Roles: Other (Comment) Contact Information: [phone] Anticipated Caregiver: Amayla Heitner (grandaughter is directing care, Pt.'s daughter to come from [place] to provide 24/7 care Caregiver Availability: 24/7 Discharge Plan Discussed with Primary Caregiver: Yes Is Caregiver In Agreement with Plan?: Yes Does Caregiver/Family have Issues with Lodging/Transportation while Pt is in Rehab?: No  Goals Patient/Family Goal for Rehab: PT/OT Supervision level Expected length of stay: 10-12 days Pt/Family Agrees to Admission and willing to participate: Yes Program Orientation Provided & Reviewed with  Pt/Caregiver Including Roles  & Responsibilities: Yes  Decrease burden of Care through IP rehab admission: n/a  Possible need for SNF placement upon discharge: not anticipated  Patient Condition: This patient's medical and functional status has changed since the consult dated: *** in which the Rehabilitation Physician determined and documented that the patient's condition is appropriate for intensive rehabilitative care in an inpatient rehabilitation facility. See "History of Present Illness" (above) for medical update. Functional changes are: ***. Patient's medical and functional status update has been discussed with the Rehabilitation physician and patient remains appropriate for inpatient rehabilitation. Will admit to inpatient rehab today.  Preadmission Screen Completed By:  Genella Mech, 08/26/2022 2:14 PM ______________________________________________________________________   Discussed status with Dr. Marland Kitchen on *** at *** and received approval for admission today.  Admission Coordinator:  Genella Mech, CCC-SLP, time Marland KitchenSudie Grumbling ***   Assessment/Plan: Diagnosis: Does the need for close, 24 hr/day Medical supervision in concert with the patient's rehab needs make it unreasonable for this patient to be served in a less intensive setting? {yes_no_potentially:3041433} Co-Morbidities requiring supervision/potential complications: *** Due to {due RE:257123, does the patient require 24 hr/day rehab nursing? {yes_no_potentially:3041433} Does the patient require coordinated care of a physician, rehab nurse, PT, OT, and SLP to address physical and functional deficits in the context of the above medical diagnosis(es)? {yes_no_potentially:3041433} Addressing deficits in the following areas: {deficits:3041436} Can the patient actively participate in an intensive therapy program of at least 3 hrs of therapy 5 days a week? {yes_no_potentially:3041433} The potential for patient to make measurable gains  while on inpatient rehab is {potential:3041437} Anticipated functional outcomes upon discharge from inpatient rehab: {functional outcomes:304600100} PT, {functional outcomes:304600100} OT, {functional outcomes:304600100} SLP Estimated rehab length of stay to reach the above functional goals is: *** Anticipated discharge destination: {anticipated dc setting:21604} 10. Overall Rehab/Functional Prognosis: {potential:3041437}   MD Signature: ***

## 2022-08-26 NOTE — Progress Notes (Signed)
PROGRESS NOTE    Dana Morrison  [id number] DOB: 1923-08-20 DOA: 08/21/2022 PCP: Sueanne Margarita, DO    Brief Narrative:  89 year old ambulatory female from home with history of hypertension, coronary artery disease, paroxysmal A-fib on Eliquis, sick sinus syndrome status post pacemaker, nocturnal oxygen dependent presented with mechanical fall and right hip fracture.  Last dose of Eliquis was 2/13 morning.  In the emergency room hemodynamically stable.  CT scan right femur with nondisplaced transcervical right femoral neck fracture.  Admitted with orthopedic consultation.   Assessment & Plan:   Closed traumatic right hip fracture: ORIF with IM nail 2/15.  Dr. Marcelino Scot.   Adequate pain medication, oral and IV opiates along with muscle relaxants and bowel regimen. Weightbearing as tolerated right lower extremity. DVT prophylaxis with Eliquis.  Tolerating. Will likely need short-term rehab before going back to her apartment. Appropriate for acute inpatient rehab.  Pending insurance authorization.  Chronic medical issues including  paroxysmal A-fib: Continue metoprolol and Multaq.  Currently in sinus rhythm.  Resume Eliquis. Chronic diastolic heart failure: Appears compensated. Coronary artery disease, currently compensated.  Resume aspirin on discharge. Chronic bronchitis and nocturnal hypoxemia: On 2 L oxygen at night.  Continue bronchodilator as needed along with steroid inhalers. Electrolytes, potassium replaced and adequate.     DVT prophylaxis: SCDs Start: 08/21/22 2358 apixaban (ELIQUIS) tablet 5 mg   Code Status: Full code Family Communication: None today. Disposition Plan: Status is: Inpatient Remains inpatient appropriate because: Pending rehab.     Consultants:  Orthopedics  Procedures:  None  Antimicrobials:  None   Subjective:  No new events.  Pain is controlled.  Patient was sleeping in the morning.  Nursing reported that she has been using oral pain  medications less frequent than yesterday.  Objective: Vitals:   08/25/22 1645 08/25/22 2054 08/26/22 0609 08/26/22 0732  BP: (!) 112/54 (!) 109/46 131/66 136/62  Pulse: 75 73 75 74  Resp:   18 18  Temp: 98.6 F (37 C) 98.1 F (36.7 C) (!) 97.5 F (36.4 C) 97.6 F (36.4 C)  TempSrc: Oral Oral Oral Oral  SpO2: 96% 97% 98% 97%  Weight:      Height:        Intake/Output Summary (Last 24 hours) at 08/26/2022 1228 Last data filed at 08/26/2022 0747 Gross per 24 hour  Intake --  Output 850 ml  Net -850 ml    Filed Weights   08/21/22 1952 08/22/22 1458  Weight: 87.5 kg 87.5 kg    Examination:  Looks comfortable lying in the bed.    Data Reviewed: I have personally reviewed following labs and imaging studies  CBC: Recent Labs  Lab 08/21/22 2021 08/22/22 0430 08/23/22 0100 08/24/22 0422 08/25/22 0208  WBC 11.2* 7.5 7.2 8.3 7.5  NEUTROABS 9.0*  --   --   --   --   HGB 11.4* 10.4* 10.3* 10.2* 10.1*  HCT 35.9* 33.2* 32.3* 31.1* 31.6*  MCV 86.5 86.5 86.6 85.0 85.4  PLT 196 170 148* 142* [id number]    Basic Metabolic Panel: Recent Labs  Lab 08/21/22 2021 08/22/22 0430 08/23/22 0100 08/24/22 0422 08/25/22 0208  NA 141 139 137 136 135  K 3.4* 4.2 4.2 4.2 4.6  CL 106 105 105 102 103  CO2 25 25 26 26 27  $ GLUCOSE 104* 102* 114* 140* 118*  BUN 31* 28* 23 26* 31*  CREATININE 1.00 0.92 0.87 0.89 1.15*  CALCIUM 8.4* 8.0* 8.0* 8.1* 7.9*  MG  --  2.1  --   --   --  GFR: Estimated Creatinine Clearance: 31.9 mL/min (A) (by C-G formula based on SCr of 1.15 mg/dL (H)). Liver Function Tests: No results for input(s): "AST", "ALT", "ALKPHOS", "BILITOT", "PROT", "ALBUMIN" in the last 168 hours. No results for input(s): "LIPASE", "AMYLASE" in the last 168 hours. No results for input(s): "AMMONIA" in the last 168 hours. Coagulation Profile: No results for input(s): "INR", "PROTIME" in the last 168 hours. Cardiac Enzymes: No results for input(s): "CKTOTAL", "CKMB",  "CKMBINDEX", "TROPONINI" in the last 168 hours. BNP (last 3 results) No results for input(s): "PROBNP" in the last 8760 hours. HbA1C: No results for input(s): "HGBA1C" in the last 72 hours. CBG: Recent Labs  Lab 08/23/22 2116 08/24/22 0411  GLUCAP 145* 137*    Lipid Profile: No results for input(s): "CHOL", "HDL", "LDLCALC", "TRIG", "CHOLHDL", "LDLDIRECT" in the last 72 hours. Thyroid Function Tests: No results for input(s): "TSH", "T4TOTAL", "FREET4", "T3FREE", "THYROIDAB" in the last 72 hours. Anemia Panel: No results for input(s): "VITAMINB12", "FOLATE", "FERRITIN", "TIBC", "IRON", "RETICCTPCT" in the last 72 hours. Sepsis Labs: No results for input(s): "PROCALCITON", "LATICACIDVEN" in the last 168 hours.  Recent Results (from the past 240 hour(s))  Surgical pcr screen     Status: None   Collection Time: 08/23/22  7:37 AM   Specimen: Nasal Mucosa; Nasal Swab  Result Value Ref Range Status   MRSA, PCR NEGATIVE NEGATIVE Final   Staphylococcus aureus NEGATIVE NEGATIVE Final    Comment: (NOTE) The Xpert SA Assay (FDA approved for NASAL specimens in patients 3 years of age and older), is one component of a comprehensive surveillance program. It is not intended to diagnose infection nor to guide or monitor treatment. Performed at [facility], [address] [place], [place] [address]          Radiology Studies: No results found.      Scheduled Meds:  acetaminophen  1,000 mg Oral Q8H   apixaban  5 mg Oral BID   dronedarone  400 mg Oral BID WC   feeding supplement  237 mL Oral BID BM   isosorbide mononitrate  30 mg Oral Daily   loperamide  2 mg Oral TID AC   metoprolol succinate  25 mg Oral Daily   mometasone-formoterol  2 puff Inhalation BID   multivitamin with minerals  1 tablet Oral Daily   pantoprazole  40 mg Oral Daily   pravastatin  20 mg Oral Daily   Continuous Infusions:  methocarbamol (ROBAXIN) IV       LOS: 5 days    Time spent: 25  minutes    Barb Merino, MD [facility] Pager [phone]

## 2022-08-27 ENCOUNTER — Inpatient Hospital Stay (HOSPITAL_COMMUNITY): Payer: Medicare HMO

## 2022-08-27 DIAGNOSIS — S72001A Fracture of unspecified part of neck of right femur, initial encounter for closed fracture: Secondary | ICD-10-CM | POA: Diagnosis not present

## 2022-08-27 MED ORDER — GUAIFENESIN-DM 100-10 MG/5ML PO SYRP
10.0000 mL | ORAL_SOLUTION | Freq: Four times a day (QID) | ORAL | Status: DC | PRN
  Administered 2022-08-27 – 2022-08-29 (×4): 10 mL via ORAL
  Filled 2022-08-27 (×7): qty 10

## 2022-08-27 MED ORDER — ASPIRIN 81 MG PO TBEC
81.0000 mg | DELAYED_RELEASE_TABLET | Freq: Every day | ORAL | Status: DC
  Administered 2022-08-27 – 2022-08-29 (×3): 81 mg via ORAL
  Filled 2022-08-27 (×3): qty 1

## 2022-08-27 NOTE — Progress Notes (Signed)
Physical Therapy Treatment Patient Details Name: Dana Morrison MRN: [id number] DOB: 1924/05/24 Today's Date: 08/27/2022   History of Present Illness 89 yo [place] was admitted on 2/13 for evaluation of R impacted femoral neck fracture, with cannulated screws for ORIF.  PMHx:  osteoporosis, HOH, a-fib, CHFN CAD, low vision, MI HTN, OA hip and knee, urticaria, venous insufficiency    PT Comments    Pt was seen for mobility assessment, noted her control of balance is improving but is having difficulties with urine incontinence and then had bleeding from hemorrhoids on the floor after using BSC.  Had her sit in chair and nursing was contacted to help with the issue.  Will recommend her to pursue CIR if possible, with default plan to SNF for rehab after her R hip ORIF.  Follow along for goals of acute PT and continue to encourage her to get stronger and stand with greater balance control to support her safety and independence with gait.   Recommendations for follow up therapy are one component of a multi-disciplinary discharge planning process, led by the attending physician.  Recommendations may be updated based on patient status, additional functional criteria and insurance authorization.  Follow Up Recommendations  Acute inpatient rehab (3hours/day)     Assistance Recommended at Discharge Frequent or constant Supervision/Assistance  Patient can return home with the following Two people to help with walking and/or transfers;A lot of help with bathing/dressing/bathroom;Assistance with cooking/housework;Direct supervision/assist for medications management;Direct supervision/assist for financial management;Assist for transportation;Help with stairs or ramp for entrance   Equipment Recommendations  Rolling walker (2 wheels)    Recommendations for Other Services Rehab consult     Precautions / Restrictions Precautions Precautions: Fall Precaution  Comments: HOH Restrictions Weight Bearing Restrictions: Yes RLE Weight Bearing: Weight bearing as tolerated     Mobility  Bed Mobility Overal bed mobility: Needs Assistance Bed Mobility: Supine to Sit, Sit to Supine     Supine to sit: Min assist, Mod assist     General bed mobility comments: assistance to RLE and to slide hips    Transfers Overall transfer level: Needs assistance Equipment used: Rolling walker (2 wheels) Transfers: Sit to/from Stand Sit to Stand: Min assist   Step pivot transfers: Min guard       General transfer comment: min guard to get to chair    Ambulation/Gait Ambulation/Gait assistance: Min assist Gait Distance (Feet): 5 Feet Assistive device: Rolling walker (2 wheels)         General Gait Details: short walk as pt is both urine incontinent and bleeding from hemorrhoids on the floor   Stairs             Wheelchair Mobility    Modified Rankin (Stroke Patients Only)       Balance Overall balance assessment: Needs assistance Sitting-balance support: Feet supported Sitting balance-Leahy Scale: Fair     Standing balance support: Bilateral upper extremity supported, During functional activity Standing balance-Leahy Scale: Poor Standing balance comment: pt was not walked far due to bleeding from her bowel movement                            Cognition Arousal/Alertness: Awake/alert Behavior During Therapy: WFL for tasks assessed/performed Overall Cognitive Status: Within Functional Limits for tasks assessed  General Comments: pt is able to identify her issues with continence and with mobility        Exercises      General Comments General comments (skin integrity, edema, etc.): Pt was moving to [facility] initially and was unsure if she had finished with bowel movement and did not know initially it had started      Pertinent Vitals/Pain Pain Assessment Pain  Assessment: Faces Faces Pain Scale: Hurts even more Pain Location: pain with transitions but distracted by her GI issues Pain Descriptors / Indicators: Guarding, Grimacing Pain Intervention(s): Limited activity within patient's tolerance, Monitored during session, Premedicated before session, Repositioned    Home Living                          Prior Function            PT Goals (current goals can now be found in the care plan section) Acute Rehab PT Goals Patient Stated Goal: to walk and get stronger Progress towards PT goals: Not progressing toward goals - comment (had moment of being mobile but is bleeding from hemorrhoids)    Frequency    Min 4X/week      PT Plan Current plan remains appropriate    Co-evaluation              AM-PAC PT "6 Clicks" Mobility   Outcome Measure  Help needed turning from your back to your side while in a flat bed without using bedrails?: A Little Help needed moving from lying on your back to sitting on the side of a flat bed without using bedrails?: A Lot Help needed moving to and from a bed to a chair (including a wheelchair)?: A Lot Help needed standing up from a chair using your arms (e.g., wheelchair or bedside chair)?: A Little Help needed to walk in hospital room?: Total Help needed climbing 3-5 steps with a railing? : Total 6 Click Score: 12    End of Session Equipment Utilized During Treatment: Gait belt Activity Tolerance: Patient limited by pain Patient left: in chair;with call bell/phone within reach Nurse Communication: Mobility status PT Visit Diagnosis: Unsteadiness on feet (R26.81);Muscle weakness (generalized) (M62.81);Pain Pain - Right/Left: Right Pain - part of body: Hip     Time: [id number] PT Time Calculation (min) (ACUTE ONLY): 32 min  Charges:  $Therapeutic Activity: 23-37 mins                    Ramond Dial 08/27/2022, 4:16 PM  Mee Hives, PT PhD Acute Rehab Dept. Number: [place]  and [place]

## 2022-08-27 NOTE — Progress Notes (Signed)
Inpatient Rehab Admissions Coordinator:    Following for potential admit pending insurance auth.   Clemens Catholic, [place], [place] Admissions Coordinator  [phone] ([place]) [phone] (office)

## 2022-08-27 NOTE — TOC Progression Note (Addendum)
Transition of Care [facility]) - Progression Note    Patient Details  Name: Dana Morrison MRN: [id number] Date of Birth: [birth year]  [facility] CM/SW [place], [place] Phone Number: 08/27/2022, 4:47 PM  Clinical Narrative:     CSW informed that CIR Josem Kaufmann was denied and that family wanting to pursue SNF. CSW met with pt bedside. She is agreeable to SNF w/u. She requests CSW speak with her daughter in law [place]. CSW left voicemail with DIL. Left SNF list and medicare star ratings bedside.  Fl2 completed and bed requests sent in hub.   Received call back from DIL and provided update. She explains they would be interested in facilities in [place] areas. She provides a few SNF's they would be interested in including [place], [place], [place], [place]. CSW sent referrals to these additional facilities that referral wasn't already sent for. Will follow up with bed offers.    Expected Discharge Plan: Skilled Nursing Facility Barriers to Discharge: Insurance Authorization, SNF Pending bed offer, Continued Medical Work up  Expected Discharge Plan and Services       Living arrangements for the past 2 months: Apartment                                       Social Determinants of Health (SDOH) Interventions SDOH Screenings   Food Insecurity: No Food Insecurity (08/22/2022)  Housing: Low Risk  (08/22/2022)  Transportation Needs: Unknown (08/22/2022)  Utilities: Not At Risk (08/22/2022)  Tobacco Use: Medium Risk (08/24/2022)    Readmission Risk Interventions     No data to display

## 2022-08-27 NOTE — NC FL2 (Signed)
[place] LEVEL OF CARE FORM     IDENTIFICATION  Patient Name: Dana Morrison Birthdate: 1923/09/09 Sex: female Admission Date (Current Location): 08/21/2022  [facility] and [place] Number:  Herbalist and Address:  [facility] [place] [place] [address] [place], [place] [address]      Provider Number: [id number]  Attending Physician Name and Address:  Barb Merino, MD  Relative Name and Phone Number:  [phone] DAUGHTER-IN-LAW    Current Level of Care: Hospital Recommended Level of Care: [place] Prior Approval Number:    Date Approved/Denied:   PASRR Number: [id number]  Discharge Plan: SNF    Current Diagnoses: Patient Active Problem List   Diagnosis Date Noted   Closed right hip fracture, initial encounter ([place]) 08/21/2022   Coronary artery disease 08/21/2022   Chronic bronchitis ([place]) 08/21/2022   Sick sinus syndrome ([place]) 09/22/2020   Pacemaker 09/22/2020   Seasonal allergic rhinitis due to pollen 09/12/2017   Paroxysmal atrial fibrillation ([place]) 09/12/2017   Chronic diastolic heart failure ([place]) 09/12/2017   Decreased vision 09/12/2017   Venous insufficiency 09/12/2017   Scalp psoriasis 09/12/2017   Urticaria 09/12/2017   Osteoarthritis of knee 09/12/2017   Osteoarthritis of hip 09/12/2017   Hypertension 09/12/2017    Orientation RESPIRATION BLADDER Height & Weight     Self, Time, Situation, Place  O2 (2L [place]) Incontinent, External catheter Weight: 192 lb 14.4 oz (87.5 kg) Height:  5' 5.98" (167.6 cm)  BEHAVIORAL SYMPTOMS/MOOD NEUROLOGICAL BOWEL NUTRITION STATUS      Continent Diet (see d/c summary)  AMBULATORY STATUS COMMUNICATION OF NEEDS Skin   Extensive Assist   Surgical wounds (incision right hip)                       Personal Care Assistance Level of Assistance  Bathing, Feeding, Dressing Bathing Assistance: Limited assistance Feeding assistance: Independent Dressing Assistance:  Limited assistance     Functional Limitations Info  Sight, Hearing, Speech Sight Info: Impaired Hearing Info: Impaired Speech Info: Adequate    SPECIAL CARE FACTORS FREQUENCY  PT (By licensed PT), OT (By licensed OT)     PT Frequency: 5x/week OT Frequency: 5x/week            Contractures Contractures Info: Not present    Additional Factors Info  Code Status, Allergies Code Status Info: Full code Allergies Info: Psyllium, Per patient she has an intolerance to some legumes. She does not have a true allergy. Pt's son confirmed this as well.           Current Medications (08/27/2022):  This is the current hospital active medication list Current Facility-Administered Medications  Medication Dose Route Frequency Provider Last Rate Last Admin   acetaminophen (TYLENOL) tablet 1,000 mg  1,000 mg Oral Q8H Ainsley Spinner, PA-C   1,000 mg at 08/27/22 1420   apixaban (ELIQUIS) tablet 5 mg  5 mg Oral BID Barb Merino, MD   5 mg at 08/27/22 [id number]   aspirin EC tablet 81 mg  81 mg Oral Daily Barb Merino, MD   81 mg at 08/27/22 1106   dronedarone (MULTAQ) tablet 400 mg  400 mg Oral BID WC Ainsley Spinner, PA-C   400 mg at 08/27/22 0912   feeding supplement (ENSURE ENLIVE / ENSURE PLUS) liquid 237 mL  237 mL Oral BID BM Barb Merino, MD   237 mL at 08/27/22 1425   guaiFENesin-dextromethorphan (ROBITUSSIN DM) 100-10 MG/5ML syrup 10 mL  10  mL Oral Q6H PRN Barb Merino, MD   10 mL at 08/27/22 0914   ipratropium-albuterol (DUONEB) 0.5-2.5 (3) MG/3ML nebulizer solution 3 mL  3 mL Nebulization Q4H PRN Ainsley Spinner, PA-C       isosorbide mononitrate (IMDUR) 24 hr tablet 30 mg  30 mg Oral Daily Ainsley Spinner, PA-C   30 mg at 08/27/22 0913   loperamide (IMODIUM) capsule 2 mg  2 mg Oral TID Eldridge Dace, PA-C   2 mg at 08/27/22 0913   methocarbamol (ROBAXIN) tablet 500 mg  500 mg Oral Q6H PRN Ainsley Spinner, PA-C   500 mg at 08/27/22 1420   Or   methocarbamol (ROBAXIN) 500 mg in dextrose 5 % 50 mL IVPB   500 mg Intravenous Q6H PRN Ainsley Spinner, PA-C       metoprolol succinate (TOPROL-XL) 24 hr tablet 25 mg  25 mg Oral Daily Ainsley Spinner, PA-C   25 mg at 08/27/22 0913   mometasone-formoterol (DULERA) 100-5 MCG/ACT inhaler 2 puff  2 puff Inhalation BID Ainsley Spinner, PA-C   2 puff at 08/27/22 [id number]   morphine (PF) 2 MG/ML injection 2 mg  2 mg Intravenous Q2H PRN Ainsley Spinner, PA-C   2 mg at 08/27/22 1106   multivitamin with minerals tablet 1 tablet  1 tablet Oral Daily Barb Merino, MD   1 tablet at 08/27/22 0913   oxyCODONE (Oxy IR/ROXICODONE) immediate release tablet 5 mg  5 mg Oral Q4H PRN Ainsley Spinner, PA-C   5 mg at 08/27/22 1420   pantoprazole (PROTONIX) EC tablet 40 mg  40 mg Oral Daily Barb Merino, MD   40 mg at 08/27/22 0912   pravastatin (PRAVACHOL) tablet 20 mg  20 mg Oral Daily Ainsley Spinner, PA-C   20 mg at 08/27/22 0913   senna-docusate (Senokot-S) tablet 1 tablet  1 tablet Oral QHS PRN Ainsley Spinner, PA-C       Facility-Administered Medications Ordered in Other Encounters  Medication Dose Route Frequency Provider Last Rate Last Admin   bupivacaine-epinephrine (PF) (MARCAINE W/ EPI) 0.5% -1:200000 injection   Peri-NEURAL Anesthesia Intra-op Audry Pili, MD   25 mL at 08/22/22 1256     Discharge Medications: Please see discharge summary for a list of discharge medications.  Relevant Imaging Results:  Relevant Lab Results:   Additional Information SSN [employer] Tiara Bartoli, [place]

## 2022-08-27 NOTE — Progress Notes (Signed)
Occupational Therapy Treatment Patient Details Name: Dana Morrison MRN: [id number] DOB: 05/02/1924 Today's Date: 08/27/2022   History of present illness 89 yo female with onset of mechanical fall in [place] facility was admitted on 2/13 for evaluation of R impacted femoral neck fracture, with cannulated screws for ORIF.  PMHx:  osteoporosis, HOH, a-fib, CHFN CAD, low vision, MI HTN, OA hip and knee, urticaria, venous insufficiency   OT comments  Pt reports having difficulty with her bowels today and couldn't do much in her PT session although is willing to participate in OT treatment session. Session focused on functional mobility and self-care while seated on EOB. Pt with intermittent coughing which is productive. Pt reports that this has been ongoing for months but it's getting better. Pt continues to be appropriate for CIR at discharge. OT will continue to follow patient acutely.    Recommendations for follow up therapy are one component of a multi-disciplinary discharge planning process, led by the attending physician.  Recommendations may be updated based on patient status, additional functional criteria and insurance authorization.    Follow Up Recommendations  Acute inpatient rehab (3hours/day)     Assistance Recommended at Discharge Frequent or constant Supervision/Assistance  Patient can return home with the following  A little help with walking and/or transfers;A lot of help with bathing/dressing/bathroom;Help with stairs or ramp for entrance;Assist for transportation;Assistance with cooking/housework   Equipment Recommendations  Other (comment) (TBD)       Precautions / Restrictions Precautions Precautions: Fall Precaution Comments: HOH Restrictions Weight Bearing Restrictions: Yes RLE Weight Bearing: Weight bearing as tolerated       Mobility Bed Mobility Overal bed mobility: Needs Assistance Bed Mobility: Supine to Sit, Sit to Supine     Supine to sit: HOB elevated, Min  guard Sit to supine: Mod assist   General bed mobility comments: Very light min assist for RLE support out of bed. Assist to pull trunk off HOB Patient Response: Cooperative  Transfers Overall transfer level: Needs assistance Equipment used: Rolling walker (2 wheels) Transfers: Bed to chair/wheelchair/BSC, Sit to/from Stand Sit to Stand: Min guard, From elevated surface     Step pivot transfers: Min guard           Balance Overall balance assessment: Needs assistance Sitting-balance support: Feet supported Sitting balance-Leahy Scale: Fair Sitting balance - Comments: seated EOB to brush teeth   Standing balance support: Bilateral upper extremity supported, During functional activity Standing balance-Leahy Scale: Poor Standing balance comment: Pt completed functional mobility from bed to sink and back to bed utilizing RW       ADL either performed or assessed with clinical judgement   ADL Overall ADL's : Needs assistance/impaired     Grooming: Oral care;Sitting;Set up        Cognition Arousal/Alertness: Awake/alert Behavior During Therapy: WFL for tasks assessed/performed Overall Cognitive Status: Within Functional Limits for tasks assessed                           Pertinent Vitals/ Pain       Pain Assessment Pain Assessment: Faces Faces Pain Scale: Hurts whole lot Pain Location: Pt experiences intermittent pain at rest in right hip. Pain felt with mobility. Pain Descriptors / Indicators: Grimacing, Guarding, Sharp Pain Intervention(s): Limited activity within patient's tolerance, Monitored during session, Repositioned         Frequency  Min 2X/week        Progress Toward Goals  OT Goals(current goals can  now be found in the care plan section)  Progress towards OT goals: Progressing toward goals     Plan Discharge plan remains appropriate;Frequency remains appropriate       AM-PAC OT "6 Clicks" Daily Activity     Outcome Measure    Help from another person eating meals?: None Help from another person taking care of personal grooming?: A Little Help from another person toileting, which includes using toliet, bedpan, or urinal?: A Little Help from another person bathing (including washing, rinsing, drying)?: A Little Help from another person to put on and taking off regular upper body clothing?: A Little Help from another person to put on and taking off regular lower body clothing?: A Little 6 Click Score: 19    End of Session Equipment Utilized During Treatment: Gait belt;Rolling walker (2 wheels)  OT Visit Diagnosis: Unsteadiness on feet (R26.81);Muscle weakness (generalized) (M62.81);Pain Pain - Right/Left: Right Pain - part of body: Hip   Activity Tolerance Patient tolerated treatment well   Patient Left with call bell/phone within reach;in bed;with bed alarm set           Time: [id number] OT Time Calculation (min): 25 min  Charges: OT General Charges $OT Visit: 1 Visit OT Treatments $Self Care/Home Management : 23-37 mins  Ailene Ravel, OTR/L,CBIS  Supplemental OT - [facility] and [facility] Secure Chat Preferred    Arafat Cocuzza, Clarene Duke 08/27/2022, 3:14 PM

## 2022-08-27 NOTE — Progress Notes (Signed)
PROGRESS NOTE    Dana Morrison  [id number] DOB: Oct 30, 1923 DOA: 08/21/2022 PCP: Sueanne Margarita, DO    Brief Narrative:  89 year old ambulatory female from home with history of hypertension, coronary artery disease, paroxysmal A-fib on Eliquis, sick sinus syndrome status post pacemaker, nocturnal oxygen dependent presented with mechanical fall and right hip fracture.  Last dose of Eliquis was 2/13 morning.  In the emergency room hemodynamically stable.  CT scan right femur with nondisplaced transcervical right femoral neck fracture.  Admitted with orthopedic consultation.   Assessment & Plan:   Closed traumatic pathological fracture right hip due to osteoporosis: Dilutional anemia due to long bone fracture and surgery. ORIF with IM nail 2/15.  Dr. Marcelino Scot.   Pain currently managed with oral opiates and muscle relaxants along with bowel regimen. Weightbearing as tolerated right lower extremity. DVT prophylaxis with Eliquis.  Tolerating. Will need short-term rehab before going back to her apartment. Appropriate for acute inpatient rehab.  Pending insurance authorization.  Chronic medical issues including  paroxysmal A-fib: Continue metoprolol and Multaq.  Currently in sinus rhythm.  Resume Eliquis. Chronic diastolic heart failure: Appears compensated. Coronary artery disease, currently compensated.  Resume aspirin today. Chronic bronchitis and nocturnal hypoxemia: On 2 L oxygen at night.  Continue bronchodilator as needed along with steroid inhalers.  Will add mucolytic's. Electrolytes, potassium replaced and adequate.     DVT prophylaxis: SCDs Start: 08/21/22 2358 apixaban (ELIQUIS) tablet 5 mg   Code Status: Full code Family Communication: None today. Disposition Plan: Status is: Inpatient Remains inpatient appropriate because: Pending rehab.  Medically stable.     Consultants:  Orthopedics  Procedures:  None  Antimicrobials:  None   Subjective:  Patient seen  and examined.  Pain is controlled however she did not get good sleep overnight.  She is coughing but she tells me that she does this all the time.  Anxious about disturbed day and night cycle.  Objective: Vitals:   08/26/22 2100 08/27/22 0516 08/27/22 0833 08/27/22 0910  BP: (!) 111/59 130/70  (!) 106/58  Pulse: 76 79  84  Resp: 18 18  18  $ Temp: 98.7 F (37.1 C) 98.4 F (36.9 C)  98.5 F (36.9 C)  TempSrc: Oral Oral  Oral  SpO2: 99% 99% 99% 100%  Weight:      Height:        Intake/Output Summary (Last 24 hours) at 08/27/2022 1053 Last data filed at 08/27/2022 0830 Gross per 24 hour  Intake 240 ml  Output 600 ml  Net -360 ml   Filed Weights   08/21/22 1952 08/22/22 1458  Weight: 87.5 kg 87.5 kg    Examination:  General: Looks comfortable.  Mildly anxious.  On 2 L oxygen. Cardiovascular: S1-S2 normal.  Regular rhythm. Respiratory: Bilateral conducted upper airway sounds.  Intermittent nagging cough.  Not in any distress.  On 2 L oxygen. Gastrointestinal: Soft.  Nontender. Ext: Right lateral thigh incision clean and dry.      Data Reviewed: I have personally reviewed following labs and imaging studies  CBC: Recent Labs  Lab 08/21/22 2021 08/22/22 0430 08/23/22 0100 08/24/22 0422 08/25/22 0208  WBC 11.2* 7.5 7.2 8.3 7.5  NEUTROABS 9.0*  --   --   --   --   HGB 11.4* 10.4* 10.3* 10.2* 10.1*  HCT 35.9* 33.2* 32.3* 31.1* 31.6*  MCV 86.5 86.5 86.6 85.0 85.4  PLT 196 170 148* 142* [id number]   Basic Metabolic Panel: Recent Labs  Lab 08/21/22 2021 08/22/22 0430 08/23/22  0100 08/24/22 0422 08/25/22 0208  NA 141 139 137 136 135  K 3.4* 4.2 4.2 4.2 4.6  CL 106 105 105 102 103  CO2 25 25 26 26 27  $ GLUCOSE 104* 102* 114* 140* 118*  BUN 31* 28* 23 26* 31*  CREATININE 1.00 0.92 0.87 0.89 1.15*  CALCIUM 8.4* 8.0* 8.0* 8.1* 7.9*  MG  --  2.1  --   --   --    GFR: Estimated Creatinine Clearance: 31.9 mL/min (A) (by C-G formula based on SCr of 1.15 mg/dL (H)). Liver  Function Tests: No results for input(s): "AST", "ALT", "ALKPHOS", "BILITOT", "PROT", "ALBUMIN" in the last 168 hours. No results for input(s): "LIPASE", "AMYLASE" in the last 168 hours. No results for input(s): "AMMONIA" in the last 168 hours. Coagulation Profile: No results for input(s): "INR", "PROTIME" in the last 168 hours. Cardiac Enzymes: No results for input(s): "CKTOTAL", "CKMB", "CKMBINDEX", "TROPONINI" in the last 168 hours. BNP (last 3 results) No results for input(s): "PROBNP" in the last 8760 hours. HbA1C: No results for input(s): "HGBA1C" in the last 72 hours. CBG: Recent Labs  Lab 08/23/22 2116 08/24/22 0411  GLUCAP 145* 137*   Lipid Profile: No results for input(s): "CHOL", "HDL", "LDLCALC", "TRIG", "CHOLHDL", "LDLDIRECT" in the last 72 hours. Thyroid Function Tests: No results for input(s): "TSH", "T4TOTAL", "FREET4", "T3FREE", "THYROIDAB" in the last 72 hours. Anemia Panel: No results for input(s): "VITAMINB12", "FOLATE", "FERRITIN", "TIBC", "IRON", "RETICCTPCT" in the last 72 hours. Sepsis Labs: No results for input(s): "PROCALCITON", "LATICACIDVEN" in the last 168 hours.  Recent Results (from the past 240 hour(s))  Surgical pcr screen     Status: None   Collection Time: 08/23/22  7:37 AM   Specimen: Nasal Mucosa; Nasal Swab  Result Value Ref Range Status   MRSA, PCR NEGATIVE NEGATIVE Final   Staphylococcus aureus NEGATIVE NEGATIVE Final    Comment: (NOTE) The Xpert SA Assay (FDA approved for NASAL specimens in patients 4 years of age and older), is one component of a comprehensive surveillance program. It is not intended to diagnose infection nor to guide or monitor treatment. Performed at [facility], [place] [address] [place], [place] [address]          Radiology Studies: No results found.      Scheduled Meds:  acetaminophen  1,000 mg Oral Q8H   apixaban  5 mg Oral BID   aspirin EC  81 mg Oral Daily   dronedarone  400 mg Oral BID  WC   feeding supplement  237 mL Oral BID BM   isosorbide mononitrate  30 mg Oral Daily   loperamide  2 mg Oral TID AC   metoprolol succinate  25 mg Oral Daily   mometasone-formoterol  2 puff Inhalation BID   multivitamin with minerals  1 tablet Oral Daily   pantoprazole  40 mg Oral Daily   pravastatin  20 mg Oral Daily   Continuous Infusions:  methocarbamol (ROBAXIN) IV       LOS: 6 days    Time spent: 25 minutes    Barb Merino, MD [facility] Pager [phone]

## 2022-08-27 NOTE — Progress Notes (Addendum)
Inpatient Rehab Admissions Coordinator:   Insurance denied case for CIR. I notified family. I likely could not win appeal on this case and I let them know that. They are thinking over their options (HH, SNF, vs appeal for CIR) and will get back to me.   Update, family called me back to let me know they want SNF   Clemens Catholic, [place], [place] Admissions Coordinator  [phone] (celll) [phone] (office)

## 2022-08-27 NOTE — Progress Notes (Signed)
Awaiting Insurance auth for CIR admit.

## 2022-08-28 DIAGNOSIS — S72001A Fracture of unspecified part of neck of right femur, initial encounter for closed fracture: Secondary | ICD-10-CM | POA: Diagnosis not present

## 2022-08-28 MED ORDER — GUAIFENESIN-DM 100-10 MG/5ML PO SYRP: 10.0000 mL | ORAL_SOLUTION | Freq: Four times a day (QID) | ORAL | 0 refills | Status: AC | PRN

## 2022-08-28 MED ORDER — METHOCARBAMOL 500 MG PO TABS: 500.0000 mg | ORAL_TABLET | Freq: Four times a day (QID) | ORAL | 0 refills | Status: AC | PRN

## 2022-08-28 MED ORDER — OXYCODONE HCL 5 MG PO TABS: 5.0000 mg | ORAL_TABLET | ORAL | 0 refills | Status: AC | PRN

## 2022-08-28 MED ORDER — PANTOPRAZOLE SODIUM 40 MG PO TBEC: 40.0000 mg | DELAYED_RELEASE_TABLET | Freq: Every day | ORAL | Status: AC

## 2022-08-28 MED ORDER — BUMETANIDE 1 MG PO TABS
1.0000 mg | ORAL_TABLET | Freq: Every day | ORAL | Status: DC
  Administered 2022-08-28 – 2022-08-29 (×2): 1 mg via ORAL
  Filled 2022-08-28 (×2): qty 1

## 2022-08-28 NOTE — Plan of Care (Signed)

## 2022-08-28 NOTE — Progress Notes (Signed)
PROGRESS NOTE    Dana Morrison  [id number] DOB: September 30, 1923 DOA: 08/21/2022 PCP: Sueanne Margarita, DO    Brief Narrative:  89 year old ambulatory female from home with history of hypertension, coronary artery disease, paroxysmal A-fib on Eliquis, sick sinus syndrome status post pacemaker, nocturnal oxygen dependent presented with mechanical fall and right hip fracture.  Last dose of Eliquis was 2/13 morning.  In the emergency room hemodynamically stable.  CT scan right femur with nondisplaced transcervical right femoral neck fracture.  Admitted with orthopedic consultation.   Assessment & Plan:   Closed traumatic pathological fracture right hip due to osteoporosis: Dilutional anemia due to long bone fracture and surgery. ORIF with IM nail 2/15.  Dr. Marcelino Scot.   Pain currently managed with oral opiates and muscle relaxants along with bowel regimen. Weightbearing as tolerated right lower extremity. DVT prophylaxis with Eliquis.  Tolerating. Will need short-term rehab before going back to her apartment. Insurance declined CIR, transfer to skilled nursing facility when bed available.  Chronic medical issues including  paroxysmal A-fib: Continue metoprolol and Multaq.  Currently in sinus rhythm.  Resumed Eliquis. Chronic diastolic heart failure: Appears compensated.  She takes Bumex at home.  Will resume today. Coronary artery disease, currently compensated.  Resume aspirin today. Chronic bronchitis and nocturnal hypoxemia: On 2 L oxygen at night.  Continue bronchodilator as needed along with steroid inhalers.  Will add mucolytic's.  She will need incentive and flutter. Electrolytes, potassium replaced and adequate.  Medically stable to transfer to SNF when bed available.     DVT prophylaxis: SCDs Start: 08/21/22 2358 apixaban (ELIQUIS) tablet 5 mg   Code Status: Full code Family Communication: None today. Disposition Plan: Status is: Inpatient Remains inpatient appropriate because:  Pending SNF bed.  Medically stable.     Consultants:  Orthopedics  Procedures:  None  Antimicrobials:  None   Subjective:  Patient seen and examined.  She had some severe pain last night and now relieved with pain medication.  Continues to have cough and some mucoid sputum.  Afebrile.  Objective: Vitals:   08/27/22 1821 08/27/22 2100 08/28/22 0420 08/28/22 0910  BP:  124/76 (!) 119/58 (!) 122/59  Pulse:  83 73 73  Resp:  18 19   Temp:  97.7 F (36.5 C) 97.6 F (36.4 C) 98.5 F (36.9 C)  TempSrc:  Oral Oral Oral  SpO2: 100% 99% 98% 97%  Weight:      Height:        Intake/Output Summary (Last 24 hours) at 08/28/2022 1151 Last data filed at 08/28/2022 0438 Gross per 24 hour  Intake --  Output 1025 ml  Net -1025 ml   Filed Weights   08/21/22 1952 08/22/22 1458  Weight: 87.5 kg 87.5 kg    Examination:  General: Looks comfortable.  Anxious with intractable cough. Cardiovascular: S1-S2 normal.  Regular rhythm. Respiratory: Bilateral conducted upper airway sounds.  Intermittent nagging cough.  Not in any distress.  On 2 L oxygen. Gastrointestinal: Soft.  Nontender. Ext: Right lateral thigh incision clean and dry.      Data Reviewed: I have personally reviewed following labs and imaging studies  CBC: Recent Labs  Lab 08/21/22 2021 08/22/22 0430 08/23/22 0100 08/24/22 0422 08/25/22 0208  WBC 11.2* 7.5 7.2 8.3 7.5  NEUTROABS 9.0*  --   --   --   --   HGB 11.4* 10.4* 10.3* 10.2* 10.1*  HCT 35.9* 33.2* 32.3* 31.1* 31.6*  MCV 86.5 86.5 86.6 85.0 85.4  PLT 196 170 148*  142* [id number]   Basic Metabolic Panel: Recent Labs  Lab 08/21/22 2021 08/22/22 0430 08/23/22 0100 08/24/22 0422 08/25/22 0208  NA 141 139 137 136 135  K 3.4* 4.2 4.2 4.2 4.6  CL 106 105 105 102 103  CO2 25 25 26 26 27  $ GLUCOSE 104* 102* 114* 140* 118*  BUN 31* 28* 23 26* 31*  CREATININE 1.00 0.92 0.87 0.89 1.15*  CALCIUM 8.4* 8.0* 8.0* 8.1* 7.9*  MG  --  2.1  --   --   --     GFR: Estimated Creatinine Clearance: 31.9 mL/min (A) (by C-G formula based on SCr of 1.15 mg/dL (H)). Liver Function Tests: No results for input(s): "AST", "ALT", "ALKPHOS", "BILITOT", "PROT", "ALBUMIN" in the last 168 hours. No results for input(s): "LIPASE", "AMYLASE" in the last 168 hours. No results for input(s): "AMMONIA" in the last 168 hours. Coagulation Profile: No results for input(s): "INR", "PROTIME" in the last 168 hours. Cardiac Enzymes: No results for input(s): "CKTOTAL", "CKMB", "CKMBINDEX", "TROPONINI" in the last 168 hours. BNP (last 3 results) No results for input(s): "PROBNP" in the last 8760 hours. HbA1C: No results for input(s): "HGBA1C" in the last 72 hours. CBG: Recent Labs  Lab 08/23/22 2116 08/24/22 0411  GLUCAP 145* 137*   Lipid Profile: No results for input(s): "CHOL", "HDL", "LDLCALC", "TRIG", "CHOLHDL", "LDLDIRECT" in the last 72 hours. Thyroid Function Tests: No results for input(s): "TSH", "T4TOTAL", "FREET4", "T3FREE", "THYROIDAB" in the last 72 hours. Anemia Panel: No results for input(s): "VITAMINB12", "FOLATE", "FERRITIN", "TIBC", "IRON", "RETICCTPCT" in the last 72 hours. Sepsis Labs: No results for input(s): "PROCALCITON", "LATICACIDVEN" in the last 168 hours.  Recent Results (from the past 240 hour(s))  Surgical pcr screen     Status: None   Collection Time: 08/23/22  7:37 AM   Specimen: Nasal Mucosa; Nasal Swab  Result Value Ref Range Status   MRSA, PCR NEGATIVE NEGATIVE Final   Staphylococcus aureus NEGATIVE NEGATIVE Final    Comment: (NOTE) The Xpert SA Assay (FDA approved for NASAL specimens in patients 64 years of age and older), is one component of a comprehensive surveillance program. It is not intended to diagnose infection nor to guide or monitor treatment. Performed at [facility], [place] [address] [place], [place] [address]          Radiology Studies: DG CHEST PORT 1 VIEW  Result Date: 08/27/2022 CLINICAL  DATA:  Cough EXAM: PORTABLE CHEST 1 VIEW COMPARISON:  08/21/2022 FINDINGS: Cardiac shadow is enlarged but stable. Pacing device is again seen. Aortic calcifications are noted. Lungs are well aerated bilaterally. No focal infiltrate or sizable effusion is seen. IMPRESSION: No acute abnormality noted. Electronically Signed   By: Inez Catalina M.D.   On: 08/27/2022 18:45        Scheduled Meds:  acetaminophen  1,000 mg Oral Q8H   apixaban  5 mg Oral BID   aspirin EC  81 mg Oral Daily   bumetanide  1 mg Oral Daily   dronedarone  400 mg Oral BID WC   feeding supplement  237 mL Oral BID BM   isosorbide mononitrate  30 mg Oral Daily   loperamide  2 mg Oral TID AC   metoprolol succinate  25 mg Oral Daily   mometasone-formoterol  2 puff Inhalation BID   multivitamin with minerals  1 tablet Oral Daily   pantoprazole  40 mg Oral Daily   pravastatin  20 mg Oral Daily   Continuous Infusions:  methocarbamol (  ROBAXIN) IV       LOS: 7 days    Time spent: 25 minutes    Barb Merino, MD [facility] Pager [phone]

## 2022-08-28 NOTE — TOC Progression Note (Signed)
Transition of Care [facility]) - Progression Note    Patient Details  Name: Dana Morrison MRN: [id number] Date of Birth: Apr 14, 1924  Transition of Care [facility]) CM/SW [place], [place] Phone Number: 08/28/2022, 2:54 PM  Clinical Narrative:     CSW met with pt in room and provides pt with SNF offer and medicare star rating list. She defers to her daughter in law. CSW calls daughter in-law to provide SNF offer. She is interested in [facility] and [facility] who have not offered. CSW agreed to follow up with those two facilities prior to DIL making SNF choice.   CSW re-sent referrals and contacted liaisons to those two facilities requesting review; Awaiting decisions.  Expected Discharge Plan: Skilled Nursing Facility Barriers to Discharge: Insurance Authorization, SNF Pending bed offer, Continued Medical Work up  Expected Discharge Plan and Services       Living arrangements for the past 2 months: Apartment                                       Social Determinants of Health (SDOH) Interventions SDOH Screenings   Food Insecurity: No Food Insecurity (08/22/2022)  Housing: Low Risk  (08/22/2022)  Transportation Needs: Unknown (08/22/2022)  Utilities: Not At Risk (08/22/2022)  Tobacco Use: Medium Risk (08/24/2022)    Readmission Risk Interventions     No data to display

## 2022-08-28 NOTE — Progress Notes (Signed)
Mobility Specialist - Progress Note   08/28/22 1420  Mobility  Activity Transferred from chair to bed  Level of Assistance Minimal assist, patient does 75% or more  Assistive Device Front wheel walker  Activity Response Tolerated well  Mobility Referral Yes  $Mobility charge 1 Mobility   Pt was received in chair and needing assistance back to bed. Pt was MinA throughout transfer. Pt c/o hip pain throughout transfer. Pt was left in bed with all needs met. RN notified.   Franki Monte  Mobility Specialist Please contact via Solicitor or Rehab office at [phone]

## 2022-08-29 DIAGNOSIS — I2510 Atherosclerotic heart disease of native coronary artery without angina pectoris: Secondary | ICD-10-CM | POA: Diagnosis not present

## 2022-08-29 DIAGNOSIS — S72001A Fracture of unspecified part of neck of right femur, initial encounter for closed fracture: Secondary | ICD-10-CM | POA: Diagnosis not present

## 2022-08-29 DIAGNOSIS — K921 Melena: Secondary | ICD-10-CM | POA: Diagnosis not present

## 2022-08-29 DIAGNOSIS — S72001D Fracture of unspecified part of neck of right femur, subsequent encounter for closed fracture with routine healing: Secondary | ICD-10-CM | POA: Diagnosis not present

## 2022-08-29 DIAGNOSIS — I700 Atherosclerosis of aorta: Secondary | ICD-10-CM | POA: Diagnosis not present

## 2022-08-29 DIAGNOSIS — Z950 Presence of cardiac pacemaker: Secondary | ICD-10-CM | POA: Diagnosis not present

## 2022-08-29 DIAGNOSIS — K922 Gastrointestinal hemorrhage, unspecified: Secondary | ICD-10-CM | POA: Diagnosis not present

## 2022-08-29 DIAGNOSIS — I495 Sick sinus syndrome: Secondary | ICD-10-CM | POA: Diagnosis not present

## 2022-08-29 DIAGNOSIS — R58 Hemorrhage, not elsewhere classified: Secondary | ICD-10-CM | POA: Diagnosis not present

## 2022-08-29 DIAGNOSIS — W19XXXD Unspecified fall, subsequent encounter: Secondary | ICD-10-CM | POA: Diagnosis not present

## 2022-08-29 DIAGNOSIS — S72034D Nondisplaced midcervical fracture of right femur, subsequent encounter for closed fracture with routine healing: Secondary | ICD-10-CM | POA: Diagnosis not present

## 2022-08-29 DIAGNOSIS — S0003XD Contusion of scalp, subsequent encounter: Secondary | ICD-10-CM | POA: Diagnosis not present

## 2022-08-29 DIAGNOSIS — I480 Paroxysmal atrial fibrillation: Secondary | ICD-10-CM | POA: Diagnosis not present

## 2022-08-29 DIAGNOSIS — I110 Hypertensive heart disease with heart failure: Secondary | ICD-10-CM | POA: Diagnosis not present

## 2022-08-29 DIAGNOSIS — R059 Cough, unspecified: Secondary | ICD-10-CM | POA: Diagnosis not present

## 2022-08-29 DIAGNOSIS — M25551 Pain in right hip: Secondary | ICD-10-CM | POA: Diagnosis not present

## 2022-08-29 DIAGNOSIS — K5730 Diverticulosis of large intestine without perforation or abscess without bleeding: Secondary | ICD-10-CM | POA: Diagnosis not present

## 2022-08-29 DIAGNOSIS — R0989 Other specified symptoms and signs involving the circulatory and respiratory systems: Secondary | ICD-10-CM | POA: Diagnosis not present

## 2022-08-29 DIAGNOSIS — M810 Age-related osteoporosis without current pathological fracture: Secondary | ICD-10-CM | POA: Diagnosis not present

## 2022-08-29 DIAGNOSIS — S72009A Fracture of unspecified part of neck of unspecified femur, initial encounter for closed fracture: Secondary | ICD-10-CM | POA: Diagnosis not present

## 2022-08-29 DIAGNOSIS — S40022D Contusion of left upper arm, subsequent encounter: Secondary | ICD-10-CM | POA: Diagnosis not present

## 2022-08-29 DIAGNOSIS — S0003XA Contusion of scalp, initial encounter: Secondary | ICD-10-CM | POA: Diagnosis not present

## 2022-08-29 DIAGNOSIS — I5032 Chronic diastolic (congestive) heart failure: Secondary | ICD-10-CM | POA: Diagnosis not present

## 2022-08-29 DIAGNOSIS — Z7401 Bed confinement status: Secondary | ICD-10-CM | POA: Diagnosis not present

## 2022-08-29 DIAGNOSIS — Z9049 Acquired absence of other specified parts of digestive tract: Secondary | ICD-10-CM | POA: Diagnosis not present

## 2022-08-29 DIAGNOSIS — Z955 Presence of coronary angioplasty implant and graft: Secondary | ICD-10-CM | POA: Diagnosis not present

## 2022-08-29 DIAGNOSIS — D649 Anemia, unspecified: Secondary | ICD-10-CM | POA: Diagnosis not present

## 2022-08-29 DIAGNOSIS — S0093XA Contusion of unspecified part of head, initial encounter: Secondary | ICD-10-CM | POA: Diagnosis not present

## 2022-08-29 MED ORDER — SENNOSIDES-DOCUSATE SODIUM 8.6-50 MG PO TABS: 1.0000 | ORAL_TABLET | Freq: Every day | ORAL | Status: DC

## 2022-08-29 NOTE — Progress Notes (Signed)
PROGRESS NOTE    Dana Morrison  [id number] DOB: 1923/09/29 DOA: 08/21/2022 PCP: Sueanne Margarita, DO    Brief Narrative:  89 year old ambulatory female from home with history of hypertension, coronary artery disease, paroxysmal A-fib on Eliquis, sick sinus syndrome status post pacemaker, nocturnal oxygen dependent presented with mechanical fall and right hip fracture.  Last dose of Eliquis was 2/13 morning.  In the emergency room hemodynamically stable.  CT scan right femur with nondisplaced transcervical right femoral neck fracture.  Admitted with orthopedic consultation.  Remains in the hospital, not found a SNF bed yet.   Assessment & Plan:   Closed traumatic pathological fracture right hip due to osteoporosis: Dilutional anemia due to long bone fracture and surgery. ORIF with IM nail 2/15.  Dr. Marcelino Scot.   Pain currently managed with oral opiates and muscle relaxants along with bowel regimen. Weightbearing as tolerated right lower extremity. DVT prophylaxis with Eliquis.  Tolerating. Will need short-term rehab before going back to her apartment. Insurance declined CIR, transfer to skilled nursing facility when bed available.  Chronic medical issues including  paroxysmal A-fib: Continue metoprolol and Multaq.  Currently in sinus rhythm.  Resumed Eliquis. Chronic diastolic heart failure: Appears compensated.  She takes Bumex at home.  Will resume today. Coronary artery disease, currently compensated.  Resume aspirin today. Chronic bronchitis and nocturnal hypoxemia: On 2 L oxygen at night.  Continue bronchodilator as needed along with steroid inhalers.  Will add mucolytic's.  She will need incentive and flutter. Electrolytes, potassium replaced and adequate.  Medically stable to transfer to SNF when bed available.     DVT prophylaxis: SCDs Start: 08/21/22 2358 apixaban (ELIQUIS) tablet 5 mg   Code Status: Full code Family Communication: None today. Disposition Plan: Status  is: Inpatient Remains inpatient appropriate because: Pending SNF bed.  Medically stable.     Consultants:  Orthopedics  Procedures:  None  Antimicrobials:  None   Subjective:  Patient seen and examined.  Coughing much better today and clear.  She was able to expectorate sputum. She had severe pain on the right hip and waited for pain medications to arrive.  Relieved after dose of morphine.  Discussed about using more frequent oxycodone and Tylenol to avoid reaching to severe pain condition.  Objective: Vitals:   08/28/22 1618 08/28/22 1956 08/29/22 0141 08/29/22 0838  BP: (!) 119/56 (!) 105/51 129/62 (!) 142/82  Pulse: 74 77 75 91  Resp:  17 17 16  $ Temp: 98.1 F (36.7 C) 98 F (36.7 C) 97.7 F (36.5 C) 97.6 F (36.4 C)  TempSrc: Oral Oral Oral Oral  SpO2: 94% 93% 97% 100%  Weight:      Height:        Intake/Output Summary (Last 24 hours) at 08/29/2022 1118 Last data filed at 08/29/2022 0134 Gross per 24 hour  Intake 100 ml  Output 2525 ml  Net -2425 ml    Filed Weights   08/21/22 1952 08/22/22 1458  Weight: 87.5 kg 87.5 kg    Examination:  General: Looks comfortable.  On room air. Cardiovascular: S1-S2 normal.  Regular rhythm. Respiratory: Bilateral clear.  No added sounds.  On room air today. Gastrointestinal: Soft.  Nontender. Ext: Right lateral thigh incision clean and dry.      Data Reviewed: I have personally reviewed following labs and imaging studies  CBC: Recent Labs  Lab 08/23/22 0100 08/24/22 0422 08/25/22 0208  WBC 7.2 8.3 7.5  HGB 10.3* 10.2* 10.1*  HCT 32.3* 31.1* 31.6*  MCV 86.6  85.0 85.4  PLT 148* 142* [id number]    Basic Metabolic Panel: Recent Labs  Lab 08/23/22 0100 08/24/22 0422 08/25/22 0208  NA 137 136 135  K 4.2 4.2 4.6  CL 105 102 103  CO2 26 26 27  $ GLUCOSE 114* 140* 118*  BUN 23 26* 31*  CREATININE 0.87 0.89 1.15*  CALCIUM 8.0* 8.1* 7.9*    GFR: Estimated Creatinine Clearance: 31.9 mL/min (A) (by C-G formula  based on SCr of 1.15 mg/dL (H)). Liver Function Tests: No results for input(s): "AST", "ALT", "ALKPHOS", "BILITOT", "PROT", "ALBUMIN" in the last 168 hours. No results for input(s): "LIPASE", "AMYLASE" in the last 168 hours. No results for input(s): "AMMONIA" in the last 168 hours. Coagulation Profile: No results for input(s): "INR", "PROTIME" in the last 168 hours. Cardiac Enzymes: No results for input(s): "CKTOTAL", "CKMB", "CKMBINDEX", "TROPONINI" in the last 168 hours. BNP (last 3 results) No results for input(s): "PROBNP" in the last 8760 hours. HbA1C: No results for input(s): "HGBA1C" in the last 72 hours. CBG: Recent Labs  Lab 08/23/22 2116 08/24/22 0411  GLUCAP 145* 137*    Lipid Profile: No results for input(s): "CHOL", "HDL", "LDLCALC", "TRIG", "CHOLHDL", "LDLDIRECT" in the last 72 hours. Thyroid Function Tests: No results for input(s): "TSH", "T4TOTAL", "FREET4", "T3FREE", "THYROIDAB" in the last 72 hours. Anemia Panel: No results for input(s): "VITAMINB12", "FOLATE", "FERRITIN", "TIBC", "IRON", "RETICCTPCT" in the last 72 hours. Sepsis Labs: No results for input(s): "PROCALCITON", "LATICACIDVEN" in the last 168 hours.  Recent Results (from the past 240 hour(s))  Surgical pcr screen     Status: None   Collection Time: 08/23/22  7:37 AM   Specimen: Nasal Mucosa; Nasal Swab  Result Value Ref Range Status   MRSA, PCR NEGATIVE NEGATIVE Final   Staphylococcus aureus NEGATIVE NEGATIVE Final    Comment: (NOTE) The Xpert SA Assay (FDA approved for NASAL specimens in patients 66 years of age and older), is one component of a comprehensive surveillance program. It is not intended to diagnose infection nor to guide or monitor treatment. Performed at [facility], [place] [address], [place], [place] [address]          Radiology Studies: DG CHEST PORT 1 VIEW  Result Date: 08/27/2022 CLINICAL DATA:  Cough EXAM: PORTABLE CHEST 1 VIEW COMPARISON:  08/21/2022  FINDINGS: Cardiac shadow is enlarged but stable. Pacing device is again seen. Aortic calcifications are noted. Lungs are well aerated bilaterally. No focal infiltrate or sizable effusion is seen. IMPRESSION: No acute abnormality noted. Electronically Signed   By: Inez Catalina M.D.   On: 08/27/2022 18:45        Scheduled Meds:  acetaminophen  1,000 mg Oral Q8H   apixaban  5 mg Oral BID   aspirin EC  81 mg Oral Daily   bumetanide  1 mg Oral Daily   dronedarone  400 mg Oral BID WC   feeding supplement  237 mL Oral BID BM   isosorbide mononitrate  30 mg Oral Daily   loperamide  2 mg Oral TID AC   metoprolol succinate  25 mg Oral Daily   mometasone-formoterol  2 puff Inhalation BID   multivitamin with minerals  1 tablet Oral Daily   pantoprazole  40 mg Oral Daily   pravastatin  20 mg Oral Daily   Continuous Infusions:  methocarbamol (ROBAXIN) IV       LOS: 8 days    Time spent: 25 minutes    Barb Merino, MD [facility] Pager  [phone] 

## 2022-08-29 NOTE — Discharge Summary (Signed)
Physician Discharge Summary  Dana Morrison [id number] DOB: May 27, 1924 DOA: 08/21/2022  PCP: Sueanne Margarita, DO  Admit date: 08/21/2022 Discharge date: 08/29/2022  Admitted From: Home Disposition: Skilled nursing facility  Recommendations for Outpatient Follow-up:  Follow up with PCP in 1-2 weeks Please obtain BMP/CBC in one week Orthopedic to schedule follow-up  Home Health: N/A Equipment/Devices: N/A  Discharge Condition: Stable CODE STATUS: Full code Diet recommendation: Regular diet  Discharge summary: 89 year old ambulatory female from home with history of hypertension, coronary artery disease, paroxysmal A-fib on Eliquis, sick sinus syndrome status post pacemaker, nocturnal oxygen dependent presented with mechanical fall and right hip fracture.  Last dose of Eliquis was 2/13 morning.  In the emergency room hemodynamically stable.  CT scan right femur with nondisplaced transcervical right femoral neck fracture.  Admitted with orthopedic consultation. Underwent surgery.  Remain in the hospital waiting for SNF bed.   Assessment & plan of care:   Closed traumatic pathological fracture right hip due to osteoporosis: Dilutional anemia due to long bone fracture and surgery. ORIF with IM nail 2/15.  Dr. Marcelino Scot.   Pain currently managed with oral opiates and muscle relaxants along with bowel regimen. Weightbearing as tolerated right lower extremity. DVT prophylaxis with Eliquis.  Tolerating.   Chronic medical issues including  paroxysmal A-fib: Continue metoprolol and Multaq.  Currently in sinus rhythm.  Resumed Eliquis.  Chronic diastolic heart failure: Appears compensated.  She takes Bumex at home.  Will resume today.  Coronary artery disease, currently compensated.  Patient is on both aspirin and Eliquis.  Chronic bronchitis and nocturnal hypoxemia: On 2 L oxygen at night.  Continue bronchodilator as needed along with steroid inhalers.  add mucolytic's.  She will need  incentive and flutter.  Electrolytes, potassium replaced and adequate.   Medically stable to transfer to SNF when bed available.  Discharge Diagnoses:  Principal Problem:   Closed right hip fracture, initial encounter [facility]) Active Problems:   Paroxysmal atrial fibrillation (HCC)   Chronic diastolic heart failure (HCC)   Hypertension   Sick sinus syndrome (HCC)   Coronary artery disease   Chronic bronchitis (HCC)    Discharge Instructions  Discharge Instructions     Call MD for:  redness, tenderness, or signs of infection (pain, swelling, redness, odor or green/yellow discharge around incision site)   Complete by: As directed    Call MD for:  severe uncontrolled pain   Complete by: As directed    Diet general   Complete by: As directed    Increase activity slowly   Complete by: As directed       Allergies as of 08/29/2022       Reactions   Psyllium Diarrhea   Other    Per patient she has an intolerance to some legumes. She does not have a true allergy. Pt's son confirmed this as well.         Medication List     TAKE these medications    acetaminophen 325 MG tablet Commonly known as: TYLENOL Take 2 tablets (650 mg total) by mouth every 8 (eight) hours as needed for moderate pain or mild pain.   apixaban 5 MG Tabs tablet Commonly known as: Eliquis Take 1 tablet (5 mg total) by mouth 2 (two) times daily.   aspirin 81 MG chewable tablet Chew 1 tablet by mouth daily.   bumetanide 1 MG tablet Commonly known as: BUMEX Take 1 tablet (1 mg total) by mouth daily.   fluticasone-salmeterol 100-50 MCG/ACT Aepb Commonly known  as: Wixela Inhub Inhale 1 puff into the lungs 2 (two) times daily.   guaiFENesin-dextromethorphan 100-10 MG/5ML syrup Commonly known as: ROBITUSSIN DM Take 10 mLs by mouth every 6 (six) hours as needed for cough.   isosorbide mononitrate 30 MG 24 hr tablet Commonly known as: IMDUR TAKE 1 TABLET EVERY DAY   methocarbamol 500 MG  tablet Commonly known as: ROBAXIN Take 1 tablet (500 mg total) by mouth every 6 (six) hours as needed for up to 7 days for muscle spasms.   metoprolol succinate 25 MG 24 hr tablet Commonly known as: TOPROL-XL TAKE 1 TABLET EVERY DAY   Multaq 400 MG tablet Generic drug: dronedarone TAKE 1 TABLET TWICE DAILY WITH MEALS   multivitamin capsule Take 1 capsule by mouth daily.   nitroGLYCERIN 0.4 MG SL tablet Commonly known as: NITROSTAT Place 0.4 mg under the tongue every 5 (five) minutes as needed for chest pain.   oxyCODONE 5 MG immediate release tablet Commonly known as: Oxy IR/ROXICODONE Take 1 tablet (5 mg total) by mouth every 4 (four) hours as needed for up to 5 days for moderate pain or severe pain.   pantoprazole 40 MG tablet Commonly known as: PROTONIX Take 1 tablet (40 mg total) by mouth daily.   pravastatin 20 MG tablet Commonly known as: PRAVACHOL TAKE 1 TABLET EVERY DAY (NEED MD APPOINTMENT) What changed: See the new instructions.   senna-docusate 8.6-50 MG tablet Commonly known as: Senokot-S Take 1 tablet by mouth at bedtime.        Follow-up Information     Altamese Secaucus, MD. Schedule an appointment as soon as possible for a visit in 2 week(s).   Specialty: Orthopedic Surgery Contact information: [place] [address] [phone]                Allergies  Allergen Reactions   Psyllium Diarrhea   Other     Per patient she has an intolerance to some legumes. She does not have a true allergy. Pt's son confirmed this as well.     Consultations: Orthopedics   Procedures/Studies: DG CHEST PORT 1 VIEW  Result Date: 08/27/2022 CLINICAL DATA:  Cough EXAM: PORTABLE CHEST 1 VIEW COMPARISON:  08/21/2022 FINDINGS: Cardiac shadow is enlarged but stable. Pacing device is again seen. Aortic calcifications are noted. Lungs are well aerated bilaterally. No focal infiltrate or sizable effusion is seen. IMPRESSION: No acute abnormality  noted. Electronically Signed   By: Inez Catalina M.D.   On: 08/27/2022 18:45   DG HIP UNILAT WITH PELVIS 2-3 VIEWS RIGHT  Result Date: 08/23/2022 CLINICAL DATA:  89 year old female with right hip fracture status post pinning today. EXAM: DG HIP (WITH OR WITHOUT PELVIS) 2-3V RIGHT COMPARISON:  Intraoperative images beginning 0851 hours today. Preoperative right hip series 08/21/2022. FINDINGS: AP view of the pelvis, AP and frogleg lateral views of the right hip demonstrate 3 cannulated screws traversing the proximal right femur now from the intertrochanteric segment across the neck into the head. Hardware appears intact. Right femoral head remains normally located. Stable underlying hip joint space loss and osteophytosis. Pre-existing left hip arthroplasty and chronic appearing right pubic rami fractures. No new osseous abnormality identified. Lumbar spine dextroconvex scoliosis and degeneration. Negative visible bowel gas. IMPRESSION: 1. Cannulated screw fixation of the proximal right femur with no adverse features. 2. Chronic right pubic rami fractures, left hip arthroplasty. Electronically Signed   By: Genevie Ann M.D.   On: 08/23/2022 12:49   DG FEMUR, MIN  2 VIEWS RIGHT  Result Date: 08/23/2022 CLINICAL DATA:  ORIF right femoral neck fracture EXAM: RIGHT FEMUR 2 VIEWS; DG C-ARM 1-60 MIN-NO REPORT COMPARISON:  08/21/2022 CT pelvis and right hip radiographs FLUOROSCOPY TIME:  Radiation Exposure Index (if provided by the fluoroscopic device): 28.4 mGy FINDINGS: Multiple nondiagnostic spot fluoroscopic intraoperative radiographs demonstrate transfixation of the right femoral neck with three well-positioned pins. IMPRESSION: Intraoperative fluoroscopic guidance for ORIF right femoral neck fracture. Electronically Signed   By: Ilona Sorrel M.D.   On: 08/23/2022 10:10   DG C-Arm 1-60 Min-No Report  Result Date: 08/23/2022 Fluoroscopy was utilized by the requesting physician.  No radiographic interpretation.    DG C-Arm 1-60 Min-No Report  Result Date: 08/23/2022 Fluoroscopy was utilized by the requesting physician.  No radiographic interpretation.   CT PELVIS WO CONTRAST  Result Date: 08/21/2022 CLINICAL DATA:  Hip trauma, fracture suspected, xray done. Right hip pain EXAM: CT PELVIS WITHOUT CONTRAST TECHNIQUE: Multidetector CT imaging of the pelvis was performed following the standard protocol without intravenous contrast. RADIATION DOSE REDUCTION: This exam was performed according to the departmental dose-optimization program which includes automated exposure control, adjustment of the mA and/or kV according to patient size and/or use of iterative reconstruction technique. COMPARISON:  None Available. FINDINGS: Urinary Tract:  No abnormality visualized. Bowel: Marked sigmoid diverticulosis. Visualized bowel loops are otherwise unremarkable. Vascular/Lymphatic: Extensive aortoiliac atherosclerotic calcification. No aortic aneurysm. No pathologic adenopathy. Reproductive: Gas within the uterine cavity is nonspecific and may relate to instrumentation though endometritis could appear similarly. The pelvic organs are otherwise unremarkable on this noncontrast examination. Other:  None Musculoskeletal: The osseous structures are diffusely osteopenic. There is abnormal angulation of the right femoral neck as well as a subtle sagittally oriented fracture plane best seen on coronal image # 69/9 in keeping with a minimally angulated, nondisplaced transcervical femoral neck fracture. No dislocation. Remote healed fractures of the right superior and inferior pubic rami. Left hip bipolar hemiarthroplasty has been performed. Advanced degenerative changes are seen within the right hip. IMPRESSION: 1. Faintly visualized, minimally angulated, nondisplaced transcervical right femoral neck fracture. The fracture is poorly visualized, however, due to marked osteopenia. If indicated, this fracture could be confirmed with MRI  examination. 2. Gas within the uterine cavity is nonspecific and may relate to instrumentation though endometritis could appear similarly. Correlation with clinical and laboratory examination may be helpful. 3. Marked sigmoid diverticulosis. 4. Extensive aortoiliac atherosclerotic calcification. Aortic Atherosclerosis (ICD10-I70.0). Electronically Signed   By: Fidela Salisbury M.D.   On: 08/21/2022 22:13   DG Chest Port 1 View  Result Date: 08/21/2022 CLINICAL DATA:  Hip pain after a fall. EXAM: PORTABLE CHEST 1 VIEW COMPARISON:  10/19/2019 FINDINGS: Cardiac pacemaker. Mild cardiac enlargement. Emphysematous changes in the lungs. Peribronchial thickening and bronchiectasis consistent with chronic bronchitis and unchanged. No airspace disease or consolidation. No pleural effusions. No pneumothorax. Mediastinal contours appear intact. Calcification of the aorta. Old appearing bilateral rib fractures. IMPRESSION: Mild cardiac enlargement. No evidence of active pulmonary disease. Emphysematous and chronic bronchitic changes in the lungs. Electronically Signed   By: Lucienne Capers M.D.   On: 08/21/2022 21:15   DG Hip Unilat W or Wo Pelvis 2-3 Views Right  Result Date: 08/21/2022 CLINICAL DATA:  Hip pain after a fall.  Trip and fall injury. EXAM: DG HIP (WITH OR WITHOUT PELVIS) 2-3V RIGHT COMPARISON:  None Available. FINDINGS: Degenerative changes in the right hip with joint space narrowing and sclerosis and prominent osteophyte formation on the femoral head. No  evidence of acute fracture or dislocation of the hip. No focal bone lesions identified. Deformities demonstrated in the right superior and inferior pubic rami as well as in the right iliac bone. Age is indeterminate. Consider CT if clinical indications suggest acute pelvic fracture. Previous left hip arthroplasty. Degenerative changes in the lumbar spine with scoliosis convex towards the right. IMPRESSION: 1. Degenerative changes in the right hip. No acute  displaced fractures identified. 2. Fracture deformities in the right hemipelvis including the iliac bone, superior and inferior pubic rami. Age is indeterminate and if acute fracture is suspected, consider CT. Electronically Signed   By: Lucienne Capers M.D.   On: 08/21/2022 21:14   (Echo, Carotid, EGD, Colonoscopy, ERCP)    Subjective: Patient seen and examined.  No overnight events.  She had pain in her right hip today morning, did not take adequate oral pain medicine so had to use morphine.  Cough and shortness of breath is better today.  On room air today.   Discharge Exam: Vitals:   08/29/22 0141 08/29/22 0838  BP: 129/62 (!) 142/82  Pulse: 75 91  Resp: 17 16  Temp: 97.7 F (36.5 C) 97.6 F (36.4 C)  SpO2: 97% 100%   Vitals:   08/28/22 1618 08/28/22 1956 08/29/22 0141 08/29/22 0838  BP: (!) 119/56 (!) 105/51 129/62 (!) 142/82  Pulse: 74 77 75 91  Resp:  17 17 16  $ Temp: 98.1 F (36.7 C) 98 F (36.7 C) 97.7 F (36.5 C) 97.6 F (36.4 C)  TempSrc: Oral Oral Oral Oral  SpO2: 94% 93% 97% 100%  Weight:      Height:        General: Pt is alert, awake, not in acute distress Cardiovascular: RRR, S1/S2 +, no rubs, no gallops, pacemaker intact left precordium. Respiratory: CTA bilaterally, no wheezing, no rhonchi.  No added sound.  On room air today. Abdominal: Soft, NT, ND, bowel sounds + Extremities: no edema, no cyanosis Right lateral thigh incision clean and dry.    The results of significant diagnostics from this hospitalization (including imaging, microbiology, ancillary and laboratory) are listed below for reference.     Microbiology: Recent Results (from the past 240 hour(s))  Surgical pcr screen     Status: None   Collection Time: 08/23/22  7:37 AM   Specimen: Nasal Mucosa; Nasal Swab  Result Value Ref Range Status   MRSA, PCR NEGATIVE NEGATIVE Final   Staphylococcus aureus NEGATIVE NEGATIVE Final    Comment: (NOTE) The Xpert SA Assay (FDA approved for NASAL  specimens in patients 4 years of age and older), is one component of a comprehensive surveillance program. It is not intended to diagnose infection nor to guide or monitor treatment. Performed at [facility], [place] [address], [place], [place] [address]      Labs: BNP (last 3 results) No results for input(s): "BNP" in the last 8760 hours. Basic Metabolic Panel: Recent Labs  Lab 08/23/22 0100 08/24/22 0422 08/25/22 0208  NA 137 136 135  K 4.2 4.2 4.6  CL 105 102 103  CO2 26 26 27  $ GLUCOSE 114* 140* 118*  BUN 23 26* 31*  CREATININE 0.87 0.89 1.15*  CALCIUM 8.0* 8.1* 7.9*   Liver Function Tests: No results for input(s): "AST", "ALT", "ALKPHOS", "BILITOT", "PROT", "ALBUMIN" in the last 168 hours. No results for input(s): "LIPASE", "AMYLASE" in the last 168 hours. No results for input(s): "AMMONIA" in the last 168 hours. CBC: Recent Labs  Lab 08/23/22 0100  08/24/22 0422 08/25/22 0208  WBC 7.2 8.3 7.5  HGB 10.3* 10.2* 10.1*  HCT 32.3* 31.1* 31.6*  MCV 86.6 85.0 85.4  PLT 148* 142* 166   Cardiac Enzymes: No results for input(s): "CKTOTAL", "CKMB", "CKMBINDEX", "TROPONINI" in the last 168 hours. BNP: Invalid input(s): "POCBNP" CBG: Recent Labs  Lab 08/23/22 2116 08/24/22 0411  GLUCAP 145* 137*   D-Dimer No results for input(s): "DDIMER" in the last 72 hours. Hgb A1c No results for input(s): "HGBA1C" in the last 72 hours. Lipid Profile No results for input(s): "CHOL", "HDL", "LDLCALC", "TRIG", "CHOLHDL", "LDLDIRECT" in the last 72 hours. Thyroid function studies No results for input(s): "TSH", "T4TOTAL", "T3FREE", "THYROIDAB" in the last 72 hours.  Invalid input(s): "FREET3" Anemia work up No results for input(s): "VITAMINB12", "FOLATE", "FERRITIN", "TIBC", "IRON", "RETICCTPCT" in the last 72 hours. Urinalysis No results found for: "COLORURINE", "APPEARANCEUR", "LABSPEC", "PHURINE", "GLUCOSEU", "HGBUR", "BILIRUBINUR", "KETONESUR", "PROTEINUR",  "UROBILINOGEN", "NITRITE", "LEUKOCYTESUR" Sepsis Labs Recent Labs  Lab 08/23/22 0100 08/24/22 0422 08/25/22 0208  WBC 7.2 8.3 7.5   Microbiology Recent Results (from the past 240 hour(s))  Surgical pcr screen     Status: None   Collection Time: 08/23/22  7:37 AM   Specimen: Nasal Mucosa; Nasal Swab  Result Value Ref Range Status   MRSA, PCR NEGATIVE NEGATIVE Final   Staphylococcus aureus NEGATIVE NEGATIVE Final    Comment: (NOTE) The Xpert SA Assay (FDA approved for NASAL specimens in patients 40 years of age and older), is one component of a comprehensive surveillance program. It is not intended to diagnose infection nor to guide or monitor treatment. Performed at [facility], [place] [address] [place], [place] [address]      Time coordinating discharge: 32 minutes  SIGNED:   Barb Merino, MD  [facility] 08/29/2022, 12:12 PM

## 2022-08-29 NOTE — Progress Notes (Signed)
Report given to [employer].

## 2022-08-29 NOTE — TOC Transition Note (Signed)
Transition of Care [facility]) - CM/SW Discharge Note   Patient Details  Name: Dana Morrison MRN: [id number] Date of Birth: 11-10-1923  [facility] CM/SW Contact:  Bethann Berkshire, LCSW Phone Number: 08/29/2022, 12:55 PM   Clinical Narrative:     DIL Anderson Malta chooses [facility] after being informed of additional bed offers. Family has agreed to cover 43$/day out of pocket fee.   Auth approved 08/29/22-- 08/31/22 [id number]  Patient will DC to: [facility] Anticipated DC date:08/29/22 Family notified: DIL Anderson Malta Transport by: Corey Harold   Per MD patient ready for DC to [facility]. RN, patient, patient's family, and facility notified of DC. Discharge Summary and FL2 sent to facility. RN to call report prior to discharge [phone] Room 115). DC packet on chart. Ambulance transport requested for patient.   CSW will sign off for now as social work intervention is no longer needed. Please consult [place] again if new needs arise.   Final next level of care: Skilled Nursing Facility Barriers to Discharge: No Barriers Identified   Patient Goals and CMS Choice      Discharge Placement                Patient chooses bed at: [facility] Patient to be transferred to facility by: [place] Name of family member notified: Daughter In-law Anderson [place] Patient and family notified of of transfer: 08/29/22  Discharge Plan and Services Additional resources added to the After Visit Summary for                                       Social Determinants of Health (SDOH) Interventions SDOH Screenings   Food Insecurity: No Food Insecurity (08/22/2022)  Housing: Low Risk  (08/22/2022)  Transportation Needs: Unknown (08/22/2022)  Utilities: Not At Risk (08/22/2022)  Tobacco Use: Medium Risk (08/24/2022)     Readmission Risk Interventions     No data to display

## 2022-08-29 NOTE — Plan of Care (Signed)
  Problem: Education: Goal: Knowledge of General Education information will improve Description: Including pain rating scale, medication(s)/side effects and non-pharmacologic comfort measures 08/29/2022 1241 by Luiz Iron, RN Outcome: Adequate for Discharge 08/29/2022 1042 by Luiz Iron, RN Outcome: Progressing   Problem: Health Behavior/Discharge Planning: Goal: Ability to manage health-related needs will improve 08/29/2022 1241 by Luiz Iron, RN Outcome: Adequate for Discharge 08/29/2022 1042 by Luiz Iron, RN Outcome: Progressing   Problem: Clinical Measurements: Goal: Ability to maintain clinical measurements within normal limits will improve Outcome: Adequate for Discharge Goal: Will remain free from infection Outcome: Adequate for Discharge Goal: Diagnostic test results will improve Outcome: Adequate for Discharge Goal: Respiratory complications will improve Outcome: Adequate for Discharge Goal: Cardiovascular complication will be avoided Outcome: Adequate for Discharge   Problem: Activity: Goal: Risk for activity intolerance will decrease Outcome: Adequate for Discharge   Problem: Nutrition: Goal: Adequate nutrition will be maintained Outcome: Adequate for Discharge   Problem: Coping: Goal: Level of anxiety will decrease 08/29/2022 1241 by Luiz Iron, RN Outcome: Adequate for Discharge 08/29/2022 1042 by Luiz Iron, RN Outcome: Progressing   Problem: Elimination: Goal: Will not experience complications related to bowel motility Outcome: Adequate for Discharge Goal: Will not experience complications related to urinary retention Outcome: Adequate for Discharge   Problem: Pain Managment: Goal: General experience of comfort will improve Outcome: Adequate for Discharge   Problem: Safety: Goal: Ability to remain free from injury will improve Outcome: Adequate for Discharge   Problem: Skin Integrity: Goal: Risk for  impaired skin integrity will decrease Outcome: Adequate for Discharge

## 2022-08-29 NOTE — Plan of Care (Signed)
?  Problem: Education: ?Goal: Knowledge of General Education information will improve ?Description: Including pain rating scale, medication(s)/side effects and non-pharmacologic comfort measures ?Outcome: Progressing ?  ?Problem: Health Behavior/Discharge Planning: ?Goal: Ability to manage health-related needs will improve ?Outcome: Progressing ?  ?Problem: Coping: ?Goal: Level of anxiety will decrease ?Outcome: Progressing ?  ?

## 2022-08-30 LAB — MISC LABCORP TEST (SEND OUT): Labcorp test code: 81950

## 2022-09-12 DIAGNOSIS — S72034D Nondisplaced midcervical fracture of right femur, subsequent encounter for closed fracture with routine healing: Secondary | ICD-10-CM | POA: Diagnosis not present

## 2022-09-15 DIAGNOSIS — M810 Age-related osteoporosis without current pathological fracture: Secondary | ICD-10-CM | POA: Diagnosis not present

## 2022-09-15 DIAGNOSIS — D649 Anemia, unspecified: Secondary | ICD-10-CM | POA: Diagnosis not present

## 2022-09-15 DIAGNOSIS — S72001D Fracture of unspecified part of neck of right femur, subsequent encounter for closed fracture with routine healing: Secondary | ICD-10-CM | POA: Diagnosis not present

## 2022-09-15 DIAGNOSIS — I110 Hypertensive heart disease with heart failure: Secondary | ICD-10-CM | POA: Diagnosis not present

## 2022-09-21 DIAGNOSIS — I480 Paroxysmal atrial fibrillation: Secondary | ICD-10-CM | POA: Diagnosis not present

## 2022-09-21 DIAGNOSIS — S0003XD Contusion of scalp, subsequent encounter: Secondary | ICD-10-CM | POA: Diagnosis not present

## 2022-09-21 DIAGNOSIS — I5032 Chronic diastolic (congestive) heart failure: Secondary | ICD-10-CM | POA: Diagnosis not present

## 2022-09-21 DIAGNOSIS — K921 Melena: Secondary | ICD-10-CM | POA: Diagnosis not present

## 2022-09-21 DIAGNOSIS — S40022D Contusion of left upper arm, subsequent encounter: Secondary | ICD-10-CM | POA: Diagnosis not present

## 2022-09-21 DIAGNOSIS — K922 Gastrointestinal hemorrhage, unspecified: Secondary | ICD-10-CM | POA: Diagnosis not present

## 2022-09-21 DIAGNOSIS — K5730 Diverticulosis of large intestine without perforation or abscess without bleeding: Secondary | ICD-10-CM | POA: Diagnosis not present

## 2022-09-21 DIAGNOSIS — I110 Hypertensive heart disease with heart failure: Secondary | ICD-10-CM | POA: Diagnosis not present

## 2022-09-21 DIAGNOSIS — I700 Atherosclerosis of aorta: Secondary | ICD-10-CM | POA: Diagnosis not present

## 2022-09-21 DIAGNOSIS — D649 Anemia, unspecified: Secondary | ICD-10-CM | POA: Diagnosis not present

## 2022-09-21 DIAGNOSIS — S0093XA Contusion of unspecified part of head, initial encounter: Secondary | ICD-10-CM | POA: Diagnosis not present

## 2022-09-21 DIAGNOSIS — S0003XA Contusion of scalp, initial encounter: Secondary | ICD-10-CM | POA: Diagnosis not present

## 2022-09-21 DIAGNOSIS — I2510 Atherosclerotic heart disease of native coronary artery without angina pectoris: Secondary | ICD-10-CM | POA: Diagnosis not present

## 2022-09-21 DIAGNOSIS — Z9049 Acquired absence of other specified parts of digestive tract: Secondary | ICD-10-CM | POA: Diagnosis not present

## 2022-09-22 DIAGNOSIS — K922 Gastrointestinal hemorrhage, unspecified: Secondary | ICD-10-CM | POA: Diagnosis not present

## 2022-09-23 DIAGNOSIS — I2510 Atherosclerotic heart disease of native coronary artery without angina pectoris: Secondary | ICD-10-CM | POA: Diagnosis not present

## 2022-09-23 DIAGNOSIS — D500 Iron deficiency anemia secondary to blood loss (chronic): Secondary | ICD-10-CM | POA: Diagnosis not present

## 2022-09-23 DIAGNOSIS — I480 Paroxysmal atrial fibrillation: Secondary | ICD-10-CM | POA: Diagnosis not present

## 2022-09-23 DIAGNOSIS — I110 Hypertensive heart disease with heart failure: Secondary | ICD-10-CM | POA: Diagnosis not present

## 2022-09-23 DIAGNOSIS — R55 Syncope and collapse: Secondary | ICD-10-CM | POA: Diagnosis not present

## 2022-09-23 DIAGNOSIS — D509 Iron deficiency anemia, unspecified: Secondary | ICD-10-CM | POA: Diagnosis not present

## 2022-09-23 DIAGNOSIS — I482 Chronic atrial fibrillation, unspecified: Secondary | ICD-10-CM | POA: Diagnosis not present

## 2022-09-23 DIAGNOSIS — S40022D Contusion of left upper arm, subsequent encounter: Secondary | ICD-10-CM | POA: Diagnosis not present

## 2022-09-23 DIAGNOSIS — S72001D Fracture of unspecified part of neck of right femur, subsequent encounter for closed fracture with routine healing: Secondary | ICD-10-CM | POA: Diagnosis not present

## 2022-09-23 DIAGNOSIS — I700 Atherosclerosis of aorta: Secondary | ICD-10-CM | POA: Diagnosis not present

## 2022-09-23 DIAGNOSIS — Z955 Presence of coronary angioplasty implant and graft: Secondary | ICD-10-CM | POA: Diagnosis not present

## 2022-09-23 DIAGNOSIS — Z950 Presence of cardiac pacemaker: Secondary | ICD-10-CM | POA: Diagnosis not present

## 2022-09-23 DIAGNOSIS — K922 Gastrointestinal hemorrhage, unspecified: Secondary | ICD-10-CM | POA: Diagnosis not present

## 2022-09-23 DIAGNOSIS — I495 Sick sinus syndrome: Secondary | ICD-10-CM | POA: Diagnosis not present

## 2022-09-23 DIAGNOSIS — K921 Melena: Secondary | ICD-10-CM | POA: Diagnosis not present

## 2022-09-23 DIAGNOSIS — W19XXXD Unspecified fall, subsequent encounter: Secondary | ICD-10-CM | POA: Diagnosis not present

## 2022-09-23 DIAGNOSIS — K625 Hemorrhage of anus and rectum: Secondary | ICD-10-CM | POA: Diagnosis not present

## 2022-09-23 DIAGNOSIS — I5032 Chronic diastolic (congestive) heart failure: Secondary | ICD-10-CM | POA: Diagnosis not present

## 2022-09-23 DIAGNOSIS — K5730 Diverticulosis of large intestine without perforation or abscess without bleeding: Secondary | ICD-10-CM | POA: Diagnosis not present

## 2022-09-23 DIAGNOSIS — D649 Anemia, unspecified: Secondary | ICD-10-CM | POA: Diagnosis not present

## 2022-09-23 DIAGNOSIS — S0003XD Contusion of scalp, subsequent encounter: Secondary | ICD-10-CM | POA: Diagnosis not present

## 2022-09-27 DIAGNOSIS — I5032 Chronic diastolic (congestive) heart failure: Secondary | ICD-10-CM | POA: Diagnosis not present

## 2022-09-27 DIAGNOSIS — S72001D Fracture of unspecified part of neck of right femur, subsequent encounter for closed fracture with routine healing: Secondary | ICD-10-CM | POA: Diagnosis not present

## 2022-09-27 DIAGNOSIS — I2510 Atherosclerotic heart disease of native coronary artery without angina pectoris: Secondary | ICD-10-CM | POA: Diagnosis not present

## 2022-09-27 DIAGNOSIS — K625 Hemorrhage of anus and rectum: Secondary | ICD-10-CM | POA: Diagnosis not present

## 2022-09-27 DIAGNOSIS — I482 Chronic atrial fibrillation, unspecified: Secondary | ICD-10-CM | POA: Diagnosis not present

## 2022-09-27 DIAGNOSIS — D509 Iron deficiency anemia, unspecified: Secondary | ICD-10-CM | POA: Diagnosis not present

## 2022-09-28 ENCOUNTER — Telehealth: Payer: Self-pay | Admitting: Cardiovascular Disease

## 2022-09-28 NOTE — Telephone Encounter (Signed)
Called to schedule over due f/u with Dr. Kyla Balzarine office - pt's son said pt was in Rehab for broken hip and will c/b

## 2022-10-01 ENCOUNTER — Telehealth: Payer: Self-pay | Admitting: Pulmonary Disease

## 2022-10-01 DIAGNOSIS — G4733 Obstructive sleep apnea (adult) (pediatric): Secondary | ICD-10-CM

## 2022-10-01 NOTE — Telephone Encounter (Signed)
Nathaneil Canary son would like to know if patient needs to be on oxygen and CPAP machine. Nathaneil Canary phone number is [phone]. May leave detailed voicemail.

## 2022-10-02 NOTE — Telephone Encounter (Signed)
Called and spoke with patient's son Nathaneil Canary. He stated that the patient has received her cpap machine but she has been struggling with the cpap mask. He wanted to know if she could go back to using the oxygen only at night. I advised him that I would send a message over to Dr. Loanne Drilling.   Below is a copy of her cpap compliance. It looks like she was recently setup on her cpap.       Dr. Loanne Drilling, can you please advise? Thanks!

## 2022-10-02 NOTE — Telephone Encounter (Signed)
Ok to discontinue CPAP if that is what patient wants.  Please continue oxygen 1L at night

## 2022-10-02 NOTE — Telephone Encounter (Signed)
Called and spoke with patient's son. He verbalized understanding and wants to have an order to D/C the cpap machine. I advised him that I would go ahead and place the order.   Order has been placed.   Nothing further needed at time of call.

## 2022-10-07 DIAGNOSIS — E785 Hyperlipidemia, unspecified: Secondary | ICD-10-CM | POA: Diagnosis not present

## 2022-10-07 DIAGNOSIS — K219 Gastro-esophageal reflux disease without esophagitis: Secondary | ICD-10-CM | POA: Diagnosis not present

## 2022-10-07 DIAGNOSIS — I2510 Atherosclerotic heart disease of native coronary artery without angina pectoris: Secondary | ICD-10-CM | POA: Diagnosis not present

## 2022-10-07 DIAGNOSIS — I5032 Chronic diastolic (congestive) heart failure: Secondary | ICD-10-CM | POA: Diagnosis not present

## 2022-10-07 DIAGNOSIS — I252 Old myocardial infarction: Secondary | ICD-10-CM | POA: Diagnosis not present

## 2022-10-07 DIAGNOSIS — M1990 Unspecified osteoarthritis, unspecified site: Secondary | ICD-10-CM | POA: Diagnosis not present

## 2022-10-07 DIAGNOSIS — M80051D Age-related osteoporosis with current pathological fracture, right femur, subsequent encounter for fracture with routine healing: Secondary | ICD-10-CM | POA: Diagnosis not present

## 2022-10-07 DIAGNOSIS — I110 Hypertensive heart disease with heart failure: Secondary | ICD-10-CM | POA: Diagnosis not present

## 2022-10-07 DIAGNOSIS — I480 Paroxysmal atrial fibrillation: Secondary | ICD-10-CM | POA: Diagnosis not present

## 2022-10-09 DIAGNOSIS — I2510 Atherosclerotic heart disease of native coronary artery without angina pectoris: Secondary | ICD-10-CM | POA: Diagnosis not present

## 2022-10-09 DIAGNOSIS — I252 Old myocardial infarction: Secondary | ICD-10-CM | POA: Diagnosis not present

## 2022-10-09 DIAGNOSIS — M80051D Age-related osteoporosis with current pathological fracture, right femur, subsequent encounter for fracture with routine healing: Secondary | ICD-10-CM | POA: Diagnosis not present

## 2022-10-09 DIAGNOSIS — M1990 Unspecified osteoarthritis, unspecified site: Secondary | ICD-10-CM | POA: Diagnosis not present

## 2022-10-09 DIAGNOSIS — E785 Hyperlipidemia, unspecified: Secondary | ICD-10-CM | POA: Diagnosis not present

## 2022-10-09 DIAGNOSIS — K219 Gastro-esophageal reflux disease without esophagitis: Secondary | ICD-10-CM | POA: Diagnosis not present

## 2022-10-09 DIAGNOSIS — I110 Hypertensive heart disease with heart failure: Secondary | ICD-10-CM | POA: Diagnosis not present

## 2022-10-09 DIAGNOSIS — I480 Paroxysmal atrial fibrillation: Secondary | ICD-10-CM | POA: Diagnosis not present

## 2022-10-09 DIAGNOSIS — I5032 Chronic diastolic (congestive) heart failure: Secondary | ICD-10-CM | POA: Diagnosis not present

## 2022-10-10 DIAGNOSIS — I5030 Unspecified diastolic (congestive) heart failure: Secondary | ICD-10-CM | POA: Diagnosis not present

## 2022-10-10 DIAGNOSIS — I495 Sick sinus syndrome: Secondary | ICD-10-CM | POA: Diagnosis not present

## 2022-10-10 DIAGNOSIS — D509 Iron deficiency anemia, unspecified: Secondary | ICD-10-CM | POA: Diagnosis not present

## 2022-10-10 DIAGNOSIS — K224 Dyskinesia of esophagus: Secondary | ICD-10-CM | POA: Diagnosis not present

## 2022-10-10 DIAGNOSIS — M810 Age-related osteoporosis without current pathological fracture: Secondary | ICD-10-CM | POA: Diagnosis not present

## 2022-10-10 DIAGNOSIS — I442 Atrioventricular block, complete: Secondary | ICD-10-CM | POA: Diagnosis not present

## 2022-10-10 DIAGNOSIS — K649 Unspecified hemorrhoids: Secondary | ICD-10-CM | POA: Diagnosis not present

## 2022-10-10 DIAGNOSIS — R053 Chronic cough: Secondary | ICD-10-CM | POA: Diagnosis not present

## 2022-10-12 ENCOUNTER — Ambulatory Visit (INDEPENDENT_AMBULATORY_CARE_PROVIDER_SITE_OTHER): Payer: Medicare HMO

## 2022-10-12 DIAGNOSIS — I2510 Atherosclerotic heart disease of native coronary artery without angina pectoris: Secondary | ICD-10-CM | POA: Diagnosis not present

## 2022-10-12 DIAGNOSIS — I480 Paroxysmal atrial fibrillation: Secondary | ICD-10-CM | POA: Diagnosis not present

## 2022-10-12 DIAGNOSIS — K219 Gastro-esophageal reflux disease without esophagitis: Secondary | ICD-10-CM | POA: Diagnosis not present

## 2022-10-12 DIAGNOSIS — M80051D Age-related osteoporosis with current pathological fracture, right femur, subsequent encounter for fracture with routine healing: Secondary | ICD-10-CM | POA: Diagnosis not present

## 2022-10-12 DIAGNOSIS — I5032 Chronic diastolic (congestive) heart failure: Secondary | ICD-10-CM | POA: Diagnosis not present

## 2022-10-12 DIAGNOSIS — E785 Hyperlipidemia, unspecified: Secondary | ICD-10-CM | POA: Diagnosis not present

## 2022-10-12 DIAGNOSIS — I252 Old myocardial infarction: Secondary | ICD-10-CM | POA: Diagnosis not present

## 2022-10-12 DIAGNOSIS — I495 Sick sinus syndrome: Secondary | ICD-10-CM | POA: Diagnosis not present

## 2022-10-12 DIAGNOSIS — I110 Hypertensive heart disease with heart failure: Secondary | ICD-10-CM | POA: Diagnosis not present

## 2022-10-12 DIAGNOSIS — M1990 Unspecified osteoarthritis, unspecified site: Secondary | ICD-10-CM | POA: Diagnosis not present

## 2022-10-12 LAB — CUP PACEART REMOTE DEVICE CHECK
Battery Remaining Longevity: 34 mo
Battery Remaining Percentage: 31 %
Battery Voltage: 2.9 V
Brady Statistic AP VP Percent: 5.5 %
Brady Statistic AP VS Percent: 1 %
Brady Statistic AS VP Percent: 93 %
Brady Statistic AS VS Percent: 1 %
Brady Statistic RA Percent Paced: 5 %
Brady Statistic RV Percent Paced: 99 %
Date Time Interrogation Session: 20240405020021
Implantable Lead Connection Status: 753985
Implantable Lead Connection Status: 753985
Implantable Lead Implant Date: 20161230
Implantable Lead Implant Date: 20161230
Implantable Lead Location: 753859
Implantable Lead Location: 753860
Implantable Pulse Generator Implant Date: 20161230
Lead Channel Impedance Value: 430 Ohm
Lead Channel Impedance Value: 550 Ohm
Lead Channel Pacing Threshold Amplitude: 0.5 V
Lead Channel Pacing Threshold Amplitude: 1.125 V
Lead Channel Pacing Threshold Pulse Width: 0.4 ms
Lead Channel Pacing Threshold Pulse Width: 1 ms
Lead Channel Sensing Intrinsic Amplitude: 2.8 mV
Lead Channel Sensing Intrinsic Amplitude: 6.7 mV
Lead Channel Setting Pacing Amplitude: 1.375
Lead Channel Setting Pacing Amplitude: 2 V
Lead Channel Setting Pacing Pulse Width: 1 ms
Lead Channel Setting Sensing Sensitivity: 2 mV
Pulse Gen Model: 2240
Pulse Gen Serial Number: 7845839

## 2022-10-15 DIAGNOSIS — I252 Old myocardial infarction: Secondary | ICD-10-CM | POA: Diagnosis not present

## 2022-10-15 DIAGNOSIS — K219 Gastro-esophageal reflux disease without esophagitis: Secondary | ICD-10-CM | POA: Diagnosis not present

## 2022-10-15 DIAGNOSIS — R0602 Shortness of breath: Secondary | ICD-10-CM | POA: Diagnosis not present

## 2022-10-15 DIAGNOSIS — I5032 Chronic diastolic (congestive) heart failure: Secondary | ICD-10-CM | POA: Diagnosis not present

## 2022-10-15 DIAGNOSIS — I272 Pulmonary hypertension, unspecified: Secondary | ICD-10-CM | POA: Diagnosis not present

## 2022-10-15 DIAGNOSIS — I5033 Acute on chronic diastolic (congestive) heart failure: Secondary | ICD-10-CM | POA: Diagnosis not present

## 2022-10-15 DIAGNOSIS — M1990 Unspecified osteoarthritis, unspecified site: Secondary | ICD-10-CM | POA: Diagnosis not present

## 2022-10-15 DIAGNOSIS — I110 Hypertensive heart disease with heart failure: Secondary | ICD-10-CM | POA: Diagnosis not present

## 2022-10-15 DIAGNOSIS — I480 Paroxysmal atrial fibrillation: Secondary | ICD-10-CM | POA: Diagnosis not present

## 2022-10-15 DIAGNOSIS — M80051D Age-related osteoporosis with current pathological fracture, right femur, subsequent encounter for fracture with routine healing: Secondary | ICD-10-CM | POA: Diagnosis not present

## 2022-10-15 DIAGNOSIS — R051 Acute cough: Secondary | ICD-10-CM | POA: Diagnosis not present

## 2022-10-15 DIAGNOSIS — E785 Hyperlipidemia, unspecified: Secondary | ICD-10-CM | POA: Diagnosis not present

## 2022-10-15 DIAGNOSIS — I5030 Unspecified diastolic (congestive) heart failure: Secondary | ICD-10-CM | POA: Diagnosis not present

## 2022-10-15 DIAGNOSIS — I2510 Atherosclerotic heart disease of native coronary artery without angina pectoris: Secondary | ICD-10-CM | POA: Diagnosis not present

## 2022-10-17 DIAGNOSIS — I110 Hypertensive heart disease with heart failure: Secondary | ICD-10-CM | POA: Diagnosis not present

## 2022-10-17 DIAGNOSIS — I5032 Chronic diastolic (congestive) heart failure: Secondary | ICD-10-CM | POA: Diagnosis not present

## 2022-10-17 DIAGNOSIS — I480 Paroxysmal atrial fibrillation: Secondary | ICD-10-CM | POA: Diagnosis not present

## 2022-10-17 DIAGNOSIS — M1990 Unspecified osteoarthritis, unspecified site: Secondary | ICD-10-CM | POA: Diagnosis not present

## 2022-10-17 DIAGNOSIS — I2510 Atherosclerotic heart disease of native coronary artery without angina pectoris: Secondary | ICD-10-CM | POA: Diagnosis not present

## 2022-10-17 DIAGNOSIS — K219 Gastro-esophageal reflux disease without esophagitis: Secondary | ICD-10-CM | POA: Diagnosis not present

## 2022-10-17 DIAGNOSIS — M80051D Age-related osteoporosis with current pathological fracture, right femur, subsequent encounter for fracture with routine healing: Secondary | ICD-10-CM | POA: Diagnosis not present

## 2022-10-17 DIAGNOSIS — I252 Old myocardial infarction: Secondary | ICD-10-CM | POA: Diagnosis not present

## 2022-10-17 DIAGNOSIS — E785 Hyperlipidemia, unspecified: Secondary | ICD-10-CM | POA: Diagnosis not present

## 2022-10-24 DIAGNOSIS — I480 Paroxysmal atrial fibrillation: Secondary | ICD-10-CM | POA: Diagnosis not present

## 2022-10-24 DIAGNOSIS — I110 Hypertensive heart disease with heart failure: Secondary | ICD-10-CM | POA: Diagnosis not present

## 2022-10-24 DIAGNOSIS — I2510 Atherosclerotic heart disease of native coronary artery without angina pectoris: Secondary | ICD-10-CM | POA: Diagnosis not present

## 2022-10-24 DIAGNOSIS — M1990 Unspecified osteoarthritis, unspecified site: Secondary | ICD-10-CM | POA: Diagnosis not present

## 2022-10-24 DIAGNOSIS — I252 Old myocardial infarction: Secondary | ICD-10-CM | POA: Diagnosis not present

## 2022-10-24 DIAGNOSIS — E785 Hyperlipidemia, unspecified: Secondary | ICD-10-CM | POA: Diagnosis not present

## 2022-10-24 DIAGNOSIS — K219 Gastro-esophageal reflux disease without esophagitis: Secondary | ICD-10-CM | POA: Diagnosis not present

## 2022-10-24 DIAGNOSIS — I5032 Chronic diastolic (congestive) heart failure: Secondary | ICD-10-CM | POA: Diagnosis not present

## 2022-10-24 DIAGNOSIS — M80051D Age-related osteoporosis with current pathological fracture, right femur, subsequent encounter for fracture with routine healing: Secondary | ICD-10-CM | POA: Diagnosis not present

## 2022-10-27 ENCOUNTER — Inpatient Hospital Stay (HOSPITAL_COMMUNITY)
Admission: EM | Admit: 2022-10-27 | Discharge: 2022-10-30 | DRG: 291 | Disposition: A | Discharge status: Home Health | Payer: Medicare HMO | Attending: Internal Medicine | Admitting: Internal Medicine

## 2022-10-27 ENCOUNTER — Encounter (HOSPITAL_COMMUNITY): Payer: Self-pay | Admitting: Internal Medicine

## 2022-10-27 ENCOUNTER — Emergency Department (HOSPITAL_COMMUNITY): Payer: Medicare HMO

## 2022-10-27 ENCOUNTER — Other Ambulatory Visit: Payer: Self-pay

## 2022-10-27 DIAGNOSIS — J9601 Acute respiratory failure with hypoxia: Secondary | ICD-10-CM | POA: Diagnosis not present

## 2022-10-27 DIAGNOSIS — Z801 Family history of malignant neoplasm of trachea, bronchus and lung: Secondary | ICD-10-CM | POA: Diagnosis not present

## 2022-10-27 DIAGNOSIS — Z8249 Family history of ischemic heart disease and other diseases of the circulatory system: Secondary | ICD-10-CM | POA: Diagnosis not present

## 2022-10-27 DIAGNOSIS — R0681 Apnea, not elsewhere classified: Secondary | ICD-10-CM | POA: Diagnosis not present

## 2022-10-27 DIAGNOSIS — I110 Hypertensive heart disease with heart failure: Principal | ICD-10-CM | POA: Diagnosis present

## 2022-10-27 DIAGNOSIS — E876 Hypokalemia: Secondary | ICD-10-CM | POA: Diagnosis present

## 2022-10-27 DIAGNOSIS — I2510 Atherosclerotic heart disease of native coronary artery without angina pectoris: Secondary | ICD-10-CM | POA: Diagnosis present

## 2022-10-27 DIAGNOSIS — Z950 Presence of cardiac pacemaker: Secondary | ICD-10-CM

## 2022-10-27 DIAGNOSIS — Z87891 Personal history of nicotine dependence: Secondary | ICD-10-CM

## 2022-10-27 DIAGNOSIS — I10 Essential (primary) hypertension: Secondary | ICD-10-CM | POA: Diagnosis not present

## 2022-10-27 DIAGNOSIS — Z7982 Long term (current) use of aspirin: Secondary | ICD-10-CM

## 2022-10-27 DIAGNOSIS — S0003XA Contusion of scalp, initial encounter: Secondary | ICD-10-CM | POA: Diagnosis not present

## 2022-10-27 DIAGNOSIS — E782 Mixed hyperlipidemia: Secondary | ICD-10-CM

## 2022-10-27 DIAGNOSIS — I480 Paroxysmal atrial fibrillation: Secondary | ICD-10-CM | POA: Diagnosis present

## 2022-10-27 DIAGNOSIS — Z79899 Other long term (current) drug therapy: Secondary | ICD-10-CM

## 2022-10-27 DIAGNOSIS — I5033 Acute on chronic diastolic (congestive) heart failure: Secondary | ICD-10-CM | POA: Diagnosis not present

## 2022-10-27 DIAGNOSIS — D638 Anemia in other chronic diseases classified elsewhere: Secondary | ICD-10-CM | POA: Diagnosis present

## 2022-10-27 DIAGNOSIS — Z96642 Presence of left artificial hip joint: Secondary | ICD-10-CM | POA: Diagnosis present

## 2022-10-27 DIAGNOSIS — Z7951 Long term (current) use of inhaled steroids: Secondary | ICD-10-CM

## 2022-10-27 DIAGNOSIS — I447 Left bundle-branch block, unspecified: Secondary | ICD-10-CM | POA: Diagnosis present

## 2022-10-27 DIAGNOSIS — R0902 Hypoxemia: Secondary | ICD-10-CM | POA: Diagnosis not present

## 2022-10-27 DIAGNOSIS — R001 Bradycardia, unspecified: Secondary | ICD-10-CM | POA: Diagnosis not present

## 2022-10-27 DIAGNOSIS — D509 Iron deficiency anemia, unspecified: Secondary | ICD-10-CM | POA: Diagnosis present

## 2022-10-27 DIAGNOSIS — I252 Old myocardial infarction: Secondary | ICD-10-CM

## 2022-10-27 DIAGNOSIS — J9600 Acute respiratory failure, unspecified whether with hypoxia or hypercapnia: Secondary | ICD-10-CM | POA: Diagnosis not present

## 2022-10-27 DIAGNOSIS — I509 Heart failure, unspecified: Secondary | ICD-10-CM | POA: Diagnosis not present

## 2022-10-27 DIAGNOSIS — R609 Edema, unspecified: Secondary | ICD-10-CM | POA: Diagnosis not present

## 2022-10-27 DIAGNOSIS — I5031 Acute diastolic (congestive) heart failure: Secondary | ICD-10-CM | POA: Diagnosis not present

## 2022-10-27 DIAGNOSIS — R0600 Dyspnea, unspecified: Secondary | ICD-10-CM | POA: Diagnosis not present

## 2022-10-27 DIAGNOSIS — Z7901 Long term (current) use of anticoagulants: Secondary | ICD-10-CM | POA: Diagnosis not present

## 2022-10-27 DIAGNOSIS — E785 Hyperlipidemia, unspecified: Secondary | ICD-10-CM | POA: Diagnosis not present

## 2022-10-27 LAB — BRAIN NATRIURETIC PEPTIDE: B Natriuretic Peptide: 386.6 pg/mL — ABNORMAL HIGH (ref 0.0–100.0)

## 2022-10-27 LAB — CBC WITH DIFFERENTIAL/PLATELET
Abs Immature Granulocytes: 0.03 10*3/uL (ref 0.00–0.07)
Basophils Absolute: 0.1 10*3/uL (ref 0.0–0.1)
Basophils Relative: 1 %
Eosinophils Absolute: 0.3 10*3/uL (ref 0.0–0.5)
Eosinophils Relative: 4 %
HCT: 34.8 % — ABNORMAL LOW (ref 36.0–46.0)
Hemoglobin: 10.8 g/dL — ABNORMAL LOW (ref 12.0–15.0)
Immature Granulocytes: 0 %
Lymphocytes Relative: 38 %
Lymphs Abs: 3.2 10*3/uL (ref 0.7–4.0)
MCH: 26.1 pg (ref 26.0–34.0)
MCHC: 31 g/dL (ref 30.0–36.0)
MCV: 84.1 fL (ref 80.0–100.0)
Monocytes Absolute: 0.8 10*3/uL (ref 0.1–1.0)
Monocytes Relative: 9 %
Neutro Abs: 3.9 10*3/uL (ref 1.7–7.7)
Neutrophils Relative %: 48 %
Platelets: 220 10*3/uL (ref 150–400)
RBC: 4.14 MIL/uL (ref 3.87–5.11)
RDW: 15.9 % — ABNORMAL HIGH (ref 11.5–15.5)
WBC: 8.2 10*3/uL (ref 4.0–10.5)
nRBC: 0 % (ref 0.0–0.2)

## 2022-10-27 LAB — I-STAT VENOUS BLOOD GAS, ED
Acid-Base Excess: 3 mmol/L — ABNORMAL HIGH (ref 0.0–2.0)
Bicarbonate: 29.6 mmol/L — ABNORMAL HIGH (ref 20.0–28.0)
Calcium, Ion: 1.11 mmol/L — ABNORMAL LOW (ref 1.15–1.40)
HCT: 29 % — ABNORMAL LOW (ref 36.0–46.0)
Hemoglobin: 9.9 g/dL — ABNORMAL LOW (ref 12.0–15.0)
O2 Saturation: 70 %
Potassium: 3.9 mmol/L (ref 3.5–5.1)
Sodium: 139 mmol/L (ref 135–145)
TCO2: 31 mmol/L (ref 22–32)
pCO2, Ven: 54.3 mmHg (ref 44–60)
pH, Ven: 7.344 (ref 7.25–7.43)
pO2, Ven: 40 mmHg (ref 32–45)

## 2022-10-27 LAB — TROPONIN I (HIGH SENSITIVITY): Troponin I (High Sensitivity): 13 ng/L (ref <18)

## 2022-10-27 LAB — COMPREHENSIVE METABOLIC PANEL
ALT: 15 U/L (ref 0–44)
AST: 33 U/L (ref 15–41)
Albumin: 3.1 g/dL — ABNORMAL LOW (ref 3.5–5.0)
Alkaline Phosphatase: 117 U/L (ref 38–126)
Anion gap: 13 (ref 5–15)
BUN: 15 mg/dL (ref 8–23)
CO2: 23 mmol/L (ref 22–32)
Calcium: 8.2 mg/dL — ABNORMAL LOW (ref 8.9–10.3)
Chloride: 100 mmol/L (ref 98–111)
Creatinine, Ser: 1.1 mg/dL — ABNORMAL HIGH (ref 0.44–1.00)
GFR, Estimated: 46 mL/min — ABNORMAL LOW (ref >60)
Glucose, Bld: 200 mg/dL — ABNORMAL HIGH (ref 70–99)
Potassium: 3.1 mmol/L — ABNORMAL LOW (ref 3.5–5.1)
Sodium: 136 mmol/L (ref 135–145)
Total Bilirubin: 0.6 mg/dL (ref 0.3–1.2)
Total Protein: 6.8 g/dL (ref 6.5–8.1)

## 2022-10-27 MED ORDER — METOPROLOL SUCCINATE ER 25 MG PO TB24
12.5000 mg | ORAL_TABLET | Freq: Every day | ORAL | Status: DC
  Administered 2022-10-27 – 2022-10-30 (×4): 12.5 mg via ORAL
  Filled 2022-10-27 (×4): qty 1

## 2022-10-27 MED ORDER — ONDANSETRON HCL 4 MG/2ML IJ SOLN
4.0000 mg | Freq: Once | INTRAMUSCULAR | Status: AC
  Administered 2022-10-27: 4 mg via INTRAVENOUS
  Filled 2022-10-27: qty 2

## 2022-10-27 MED ORDER — SPIRONOLACTONE 12.5 MG HALF TABLET
12.5000 mg | ORAL_TABLET | Freq: Every day | ORAL | Status: DC
  Administered 2022-10-27 – 2022-10-30 (×4): 12.5 mg via ORAL
  Filled 2022-10-27 (×4): qty 1

## 2022-10-27 MED ORDER — EMPAGLIFLOZIN 10 MG PO TABS
10.0000 mg | ORAL_TABLET | Freq: Every day | ORAL | Status: DC
  Administered 2022-10-28 – 2022-10-30 (×3): 10 mg via ORAL
  Filled 2022-10-27 (×3): qty 1

## 2022-10-27 MED ORDER — MELATONIN 3 MG PO TABS
3.0000 mg | ORAL_TABLET | Freq: Every evening | ORAL | Status: DC | PRN
  Administered 2022-10-29 (×2): 3 mg via ORAL
  Filled 2022-10-27 (×2): qty 1

## 2022-10-27 MED ORDER — PRAVASTATIN SODIUM 10 MG PO TABS
20.0000 mg | ORAL_TABLET | Freq: Every day | ORAL | Status: DC
  Administered 2022-10-27 – 2022-10-30 (×4): 20 mg via ORAL
  Filled 2022-10-27 (×4): qty 2

## 2022-10-27 MED ORDER — ACETAMINOPHEN 325 MG PO TABS
650.0000 mg | ORAL_TABLET | Freq: Four times a day (QID) | ORAL | Status: DC | PRN
  Administered 2022-10-28: 650 mg via ORAL
  Filled 2022-10-27: qty 2

## 2022-10-27 MED ORDER — PANTOPRAZOLE SODIUM 40 MG PO TBEC
40.0000 mg | DELAYED_RELEASE_TABLET | Freq: Every day | ORAL | Status: DC
  Administered 2022-10-27 – 2022-10-30 (×4): 40 mg via ORAL
  Filled 2022-10-27 (×4): qty 1

## 2022-10-27 MED ORDER — ACETAMINOPHEN 650 MG RE SUPP: 650.0000 mg | Freq: Four times a day (QID) | RECTAL | Status: DC | PRN

## 2022-10-27 MED ORDER — DRONEDARONE HCL 400 MG PO TABS
400.0000 mg | ORAL_TABLET | Freq: Two times a day (BID) | ORAL | Status: DC
  Administered 2022-10-27 – 2022-10-30 (×6): 400 mg via ORAL
  Filled 2022-10-27 (×9): qty 1

## 2022-10-27 MED ORDER — POTASSIUM CHLORIDE 10 MEQ/100ML IV SOLN
10.0000 meq | INTRAVENOUS | Status: AC
  Administered 2022-10-27 (×2): 10 meq via INTRAVENOUS
  Filled 2022-10-27 (×2): qty 100

## 2022-10-27 MED ORDER — FUROSEMIDE 10 MG/ML IJ SOLN
80.0000 mg | Freq: Two times a day (BID) | INTRAMUSCULAR | Status: DC
  Administered 2022-10-27 – 2022-10-30 (×7): 80 mg via INTRAVENOUS
  Filled 2022-10-27 (×7): qty 8

## 2022-10-27 MED ORDER — POTASSIUM CHLORIDE CRYS ER 20 MEQ PO TBCR
20.0000 meq | EXTENDED_RELEASE_TABLET | Freq: Every day | ORAL | Status: DC
  Administered 2022-10-27: 20 meq via ORAL
  Filled 2022-10-27: qty 1

## 2022-10-27 MED ORDER — ONDANSETRON HCL 4 MG/2ML IJ SOLN: 4.0000 mg | Freq: Four times a day (QID) | INTRAMUSCULAR | Status: DC | PRN

## 2022-10-27 MED ORDER — FUROSEMIDE 10 MG/ML IJ SOLN
80.0000 mg | Freq: Once | INTRAMUSCULAR | Status: AC
  Administered 2022-10-27: 80 mg via INTRAVENOUS
  Filled 2022-10-27: qty 8

## 2022-10-27 MED ORDER — ASPIRIN 81 MG PO CHEW
81.0000 mg | CHEWABLE_TABLET | Freq: Every day | ORAL | Status: DC
  Administered 2022-10-27 – 2022-10-30 (×4): 81 mg via ORAL
  Filled 2022-10-27 (×4): qty 1

## 2022-10-27 MED ORDER — MOMETASONE FURO-FORMOTEROL FUM 100-5 MCG/ACT IN AERO
2.0000 | INHALATION_SPRAY | Freq: Two times a day (BID) | RESPIRATORY_TRACT | Status: DC
  Administered 2022-10-27 – 2022-10-30 (×5): 2 via RESPIRATORY_TRACT
  Filled 2022-10-27: qty 8.8

## 2022-10-27 MED ORDER — APIXABAN 5 MG PO TABS
5.0000 mg | ORAL_TABLET | Freq: Two times a day (BID) | ORAL | Status: DC
  Administered 2022-10-27 – 2022-10-30 (×6): 5 mg via ORAL
  Filled 2022-10-27 (×7): qty 1

## 2022-10-27 NOTE — ED Triage Notes (Signed)
Pt arrived via [facility] for respiratory distress from home. Pt called 911 in distress. EMS arrived to find pt on couch in respiratory distress. Pt was pale, cool, clammy with rales heard, RA sat 50%. Periods of apnea requiring painful stimuli. Pt placed on CPAP @ 10 and oxygen saturation rose to 85% with pt becoming more responsive. 1SL Nitro administered. 20g RFA, 18g LAC.   PTA EMS Vitals BP 138/70 HR 82 SPO2 50% RA -> 85% CPAP RR 0-30

## 2022-10-27 NOTE — Progress Notes (Signed)
I took pt off of bipap to see how she would do. Placed on nasal cannula 2lpm. Pt desat and had increased work of breathing and had multiple pvcs. Pt plased back on bipap and is doing well. RT will continue to monitor

## 2022-10-27 NOTE — Hospital Course (Addendum)
Dana Morrison was admitted to the hospital with the working diagnosis of decompensated heart failure.   89 yo female with the past medical history of heart failure, paroxysmal atrial fibrillation, sick sinus syndrome sp Pacemaker, hypertension and dyslipidemia who presented with dyspnea. Reported on day of progressive dyspnea, associated with orthopnea and lower extremity edema.  Recent hospitalization 08/2022 for right hip fracture, and discharged to SNF, apparently her bumetanide was initially held at the nursing facility.  Hospitalization on 03/204 for GI bleeding, and apixaban was held. At home she developed worsening edema and her diuretic dose was increased per instructions of her primary care provider with no much improvement in her symptoms.   Because of her severe symptoms EMS was called. She was found in respiratory distress, her 02 saturation was in the 50s, she was placed on Cpap and transported to the ED, where she was transitioned to Bipap, her blood pressure was 104/60 and 95/58, HR 59, RR 21 and 02 saturation 97%, lungs with no wheezing or rales, heart with S1 and S2 present and regular, abdomen with no distention and positive lower extremity edema.   Na 136, K 3,1 Cl 100 bicarbonate 23, glucose 200 bun 15 cr 1,1 BNP 386  Wbc 8,2 hgb 10,8 plt 220   Chest radiograph with cardiomegaly, bilateral hilar vascular congestion, with bilateral interstitial infiltrates, symmetric and more predominant upper lobes, cephalization of the vasculature. No effusions.  Pacemaker in place with one atrial and one right ventricular lead.   EKG 87 bpm, left axis, left bundle branch block, atrial sensing and ventricular pacing, qtc 571, with no significant ST segment or T wave changes.   02/22 patient is tolerating well diuresis with improvement in volume status. Echocardiogram with reduction in LV systolic function.   02/23 patient with improvement in her volume status.  Plan to follow up as outpatient.

## 2022-10-27 NOTE — Assessment & Plan Note (Signed)
Echocardiogram with reduced LV systolic function 35 to 40%, global hypokinesis, no LVH, RV mild mild enlargement, RVSP 53,4 mmHg. No significant valvular disease.  Paradoxical septal movement due to RV pacing   Urine output is 2,300 ml Systolic blood pressure is 106 to 120 mmHg.   Plan to continue diuresis with furosemide 80 mg IV q12 hrs Continue with empagliflozin and spironolactone. Hold on ARB due to risk of hypotension.  Continue metoprolol succinate.

## 2022-10-27 NOTE — Assessment & Plan Note (Signed)
Continue with statin therapy.  ?

## 2022-10-27 NOTE — Assessment & Plan Note (Addendum)
Continue rate control with metoprolol and dronedarone.  Anticoagulation on hold due to recent GI bleeding.

## 2022-10-27 NOTE — ED Notes (Signed)
ED TO INPATIENT HANDOFF REPORT  ED Nurse Name and Phone #:  Lew Dawes RN  [phone]  S Name/Age/Gender Dana Morrison 89 y.o. female Room/Bed: TRACC/TRACC  Code Status   Code Status: Full Code  Home/SNF/Other Home Patient oriented to: person , place and events  Is this baseline? Yes   Triage Complete: Triage complete  Chief Complaint Acute on chronic diastolic heart failure [I50.33]  Triage Note Pt arrived via [facility] for respiratory distress from home. Pt called 911 in distress. EMS arrived to find pt on couch in respiratory distress. Pt was pale, cool, clammy with rales heard, RA sat 50%. Periods of apnea requiring painful stimuli. Pt placed on CPAP @ 10 and oxygen saturation rose to 85% with pt becoming more responsive. 1SL Nitro administered. 20g RFA, 18g LAC.   PTA EMS Vitals BP 138/70 HR 82 SPO2 50% RA -> 85% CPAP RR 0-30   Allergies Allergies  Allergen Reactions   Psyllium Diarrhea   Other     Per patient she has an intolerance to some legumes. She does not have a true allergy. Pt's son confirmed this as well.     Level of Care/Admitting Diagnosis ED Disposition     ED Disposition  Admit   Condition  --   Comment  Hospital Area: [facility] [[id number]]  Level of Care: Progressive [102]  Admit to Progressive based on following criteria: MULTISYSTEM THREATS such as stable sepsis, metabolic/electrolyte imbalance with or without encephalopathy that is responding to early treatment.  May admit patient to [facility] or [facility] if equivalent level of care is available:: No  Covid Evaluation: Asymptomatic - no recent exposure (last 10 days) testing not required  Diagnosis: Acute on chronic diastolic heart failure [428.33.ICD-9-CM]  Admitting Physician: Angie Fava [[id number]]  Attending Physician: Angie Fava [[id number]]  Certification:: I certify this patient will need inpatient services for at least 2 midnights  Estimated Length of  Stay: 2          B Medical/Surgery History Past Medical History:  Diagnosis Date   Atrial fibrillation    Chronic diastolic heart failure 09/12/2017   Congestive heart failure (CHF)    estimated ejection fraction is 54%; diastolic   Coronary artery disease    Decreased vision 09/12/2017   Hx of myocardial infarction 2010   Hypertension    Osteoarthritis of hip 09/12/2017   Osteoarthritis of knee 09/12/2017   Paroxysmal atrial fibrillation 09/12/2017   Scalp psoriasis 09/12/2017   Seasonal allergic rhinitis due to pollen 09/12/2017   Urticaria 09/12/2017   Venous insufficiency 09/12/2017   Past Surgical History:  Procedure Laterality Date   APPENDECTOMY     ARM FRACTURE Left    FRACTURE SURGERY Bilateral    WRISTS   HIP PINNING,CANNULATED Right 08/23/2022   Procedure: CANNULATED HIP PINNING;  Surgeon: Myrene Galas, MD;  Location: [facility] OR;  Service: Orthopedics;  Laterality: Right;   LAPAROSCOPIC LYSIS OF ADHESIONS     PACEMAKER INSERTION     PLACEMENT OF STENTS     X5   REPLACEMENT TOTAL KNEE     REVISION TOTAL HIP ARTHROPLASTY Left      A IV Location/Drains/Wounds Patient Lines/Drains/Airways Status     Active Line/Drains/Airways     Name Placement date Placement time Site Days   Peripheral IV 10/27/22 18 G Left Antecubital 10/27/22  0235  Antecubital  less than 1   Peripheral IV 10/27/22 20 G Anterior;Right Forearm 10/27/22  0236  Forearm  less than 1   External Urinary Catheter 10/27/22  0310  --  less than 1            Intake/Output Last 24 hours  Intake/Output Summary (Last 24 hours) at 10/27/2022 1219 Last data filed at 10/27/2022 0530 Gross per 24 hour  Intake 100 ml  Output --  Net 100 ml    Labs/Imaging Results for orders placed or performed during the hospital encounter of 10/27/22 (from the past 48 hour(s))  CBC with Differential     Status: Abnormal   Collection Time: 10/27/22  2:33 AM  Result Value Ref Range   WBC 8.2 4.0 - 10.5 K/uL   RBC 4.14 3.87  - 5.11 MIL/uL   Hemoglobin 10.8 (L) 12.0 - 15.0 g/dL   HCT 40.9 (L) 81.1 - 91.4 %   MCV 84.1 80.0 - 100.0 fL   MCH 26.1 26.0 - 34.0 pg   MCHC 31.0 30.0 - 36.0 g/dL   RDW 78.2 (H) 95.6 - 21.3 %   Platelets 220 150 - 400 K/uL   nRBC 0.0 0.0 - 0.2 %   Neutrophils Relative % 48 %   Neutro Abs 3.9 1.7 - 7.7 K/uL   Lymphocytes Relative 38 %   Lymphs Abs 3.2 0.7 - 4.0 K/uL   Monocytes Relative 9 %   Monocytes Absolute 0.8 0.1 - 1.0 K/uL   Eosinophils Relative 4 %   Eosinophils Absolute 0.3 0.0 - 0.5 K/uL   Basophils Relative 1 %   Basophils Absolute 0.1 0.0 - 0.1 K/uL   Immature Granulocytes 0 %   Abs Immature Granulocytes 0.03 0.00 - 0.07 K/uL    Comment: Performed at [facility], [address] [place], [place] [address]  Comprehensive metabolic panel     Status: Abnormal   Collection Time: 10/27/22  2:33 AM  Result Value Ref Range   Sodium 136 135 - 145 mmol/L   Potassium 3.1 (L) 3.5 - 5.1 mmol/L   Chloride 100 98 - 111 mmol/L   CO2 23 22 - 32 mmol/L   Glucose, Bld 200 (H) 70 - 99 mg/dL    Comment: Glucose reference range applies only to samples taken after fasting for at least 8 hours.   BUN 15 8 - 23 mg/dL   Creatinine, Ser 8.46 (H) 0.44 - 1.00 mg/dL   Calcium 8.2 (L) 8.9 - 10.3 mg/dL   Total Protein 6.8 6.5 - 8.1 g/dL   Albumin 3.1 (L) 3.5 - 5.0 g/dL   AST 33 15 - 41 U/L   ALT 15 0 - 44 U/L   Alkaline Phosphatase 117 38 - 126 U/L   Total Bilirubin 0.6 0.3 - 1.2 mg/dL   GFR, Estimated 46 (L) >60 mL/min    Comment: (NOTE) Calculated using the CKD-EPI Creatinine Equation (2021)    Anion gap 13 5 - 15    Comment: Performed at [facility], [address], [place], [place] [address]  Troponin I (High Sensitivity)     Status: None   Collection Time: 10/27/22  2:33 AM  Result Value Ref Range   Troponin I (High Sensitivity) 13 <18 ng/L    Comment: (NOTE) Elevated high sensitivity troponin I (hsTnI) values and significant  changes across serial measurements may  suggest ACS but many other  chronic and acute conditions are known to elevate hsTnI results.  Refer to the "Links" section for chest pain algorithms and additional  guidance. Performed at [facility], [address] [place], [place] [address]   Brain natriuretic peptide     Status: Abnormal   Collection Time: 10/27/22  2:33 AM  Result Value Ref Range   B Natriuretic Peptide 386.6 (H) 0.0 - 100.0 pg/mL    Comment: Performed at [facility], [address] [place], [place] [address]  I-Stat venous blood gas, ED     Status: Abnormal   Collection Time: 10/27/22  6:17 AM  Result Value Ref Range   pH, Ven 7.344 7.25 - 7.43   pCO2, Ven 54.3 44 - 60 mmHg   pO2, Ven 40 32 - 45 mmHg   Bicarbonate 29.6 (H) 20.0 - 28.0 mmol/L   TCO2 31 22 - 32 mmol/L   O2 Saturation 70 %   Acid-Base Excess 3.0 (H) 0.0 - 2.0 mmol/L   Sodium 139 135 - 145 mmol/L   Potassium 3.9 3.5 - 5.1 mmol/L   Calcium, Ion 1.11 (L) 1.15 - 1.40 mmol/L   HCT 29.0 (L) 36.0 - 46.0 %   Hemoglobin 9.9 (L) 12.0 - 15.0 g/dL   Sample type VENOUS    DG Chest Portable 1 View  Result Date: 10/27/2022 CLINICAL DATA:  Dyspnea EXAM: PORTABLE CHEST 1 VIEW COMPARISON:  08/27/2022 FINDINGS: The lungs are symmetrically well expanded. Superimposed extensive perihilar airspace infiltrate has developed in keeping with alveolar pulmonary edema or diffuse pneumonic infiltrate. No pneumothorax or pleural effusion. Cardiac size is within normal limits. Left subclavian dual lead pacemaker is unchanged. No acute bone abnormality. IMPRESSION: 1. Interval development of extensive perihilar airspace infiltrate in keeping with alveolar pulmonary edema or diffuse pneumonic infiltrate. Electronically Signed   By: Helyn Numbers M.D.   On: 10/27/2022 02:57    Pending Labs Unresulted Labs (From admission, onward)     Start     Ordered   10/28/22 0500  Basic metabolic panel  Tomorrow morning,   R        10/27/22 1111   10/28/22 0500  Magnesium   Tomorrow morning,   R        10/27/22 1111            Vitals/Pain Today's Vitals   10/27/22 0830 10/27/22 1115 10/27/22 1145 10/27/22 1200  BP: (!) 108/52 106/67 (!) 114/54 (!) 111/50  Pulse: 74 73 74 72  Resp: (!) 24 (!) 26 20 18   Temp:   (!) 97 F (36.1 C)   TempSrc:   Temporal   SpO2: 97% 96% 95% 95%  Height:      PainSc:   0-No pain     Isolation Precautions No active isolations  Medications Medications  acetaminophen (TYLENOL) tablet 650 mg (has no administration in time range)    Or  acetaminophen (TYLENOL) suppository 650 mg (has no administration in time range)  melatonin tablet 3 mg (has no administration in time range)  ondansetron (ZOFRAN) injection 4 mg (has no administration in time range)  furosemide (LASIX) injection 80 mg (80 mg Intravenous Given 10/27/22 1055)  apixaban (ELIQUIS) tablet 5 mg (5 mg Oral Not Given 10/27/22 1059)  aspirin chewable tablet 81 mg (81 mg Oral Given 10/27/22 1142)  mometasone-formoterol (DULERA) 100-5 MCG/ACT inhaler 2 puff (0 puffs Inhalation Hold 10/27/22 0744)  metoprolol succinate (TOPROL-XL) 24 hr tablet 12.5 mg (12.5 mg Oral Given 10/27/22 1051)  dronedarone (MULTAQ) tablet 400 mg (400 mg Oral Not Given 10/27/22 0732)  pantoprazole (PROTONIX) EC tablet 40 mg (40 mg Oral Given 10/27/22 1051)  pravastatin (PRAVACHOL) tablet 20  mg (20 mg Oral Given 10/27/22 1051)  spironolactone (ALDACTONE) tablet 12.5 mg (12.5 mg Oral Given 10/27/22 1142)  empagliflozin (JARDIANCE) tablet 10 mg (10 mg Oral Not Given 10/27/22 1155)  ondansetron (ZOFRAN) injection 4 mg (4 mg Intravenous Given 10/27/22 0250)  furosemide (LASIX) injection 80 mg (80 mg Intravenous Given 10/27/22 0406)  potassium chloride 10 mEq in 100 mL IVPB (0 mEq Intravenous Stopped 10/27/22 5621)    Mobility walks with device     Focused Assessments Cardiac Assessment Handoff:    No results found for: "CKTOTAL", "CKMB", "CKMBINDEX", "TROPONINI" No results found for:  "DDIMER" Does the Patient currently have chest pain? No    R Recommendations: See Admitting Provider Note  Report given to:   Additional Notes:  Patient was using purewick in the ED after having lasix. She is alert and oriented, son has been at the bedside.

## 2022-10-27 NOTE — Assessment & Plan Note (Signed)
Continue aggressive diuresis. Continue metoprolol Continue close blood pressure monitoring.

## 2022-10-27 NOTE — ED Provider Notes (Signed)
[name] EMERGENCY DEPARTMENT AT [facility] Provider Note   CSN: [id number] Arrival date & time: 10/27/22  7829     History  Chief Complaint  Patient presents with   Respiratory Distress    Dana Morrison is a 89 y.o. female.  89 year old female with a history of A-fib on Eliquis, heart failure status post pacemaker on Bumex who presents ER today secondary to shortness of breath.  Called EMS herself.  On their arrival she was hypoxic to 50% on room air.  Diaphoretic, diffuse Rales.  They started her on CPAP she reportedly had periods of apnea.  Patient denies any fever states she is coughing up a little bit of sputum.  Her lower extremity swelling is worsened.  States she is on her medications.  Patient is a full code.  Last echo 2019.  Sees Dr. Eden Emms.        Home Medications Prior to Admission medications   Medication Sig Start Date End Date Taking? Authorizing Provider  acetaminophen (TYLENOL) 325 MG tablet Take 2 tablets (650 mg total) by mouth every 8 (eight) hours as needed for moderate pain or mild pain. 08/24/22   Montez Morita, PA-C  apixaban (ELIQUIS) 5 MG TABS tablet Take 1 tablet (5 mg total) by mouth 2 (two) times daily. 06/12/22   Wendall Stade, MD  aspirin 81 MG chewable tablet Chew 1 tablet by mouth daily. 05/19/17   [provider]  bumetanide (BUMEX) 1 MG tablet Take 1 tablet (1 mg total) by mouth daily. 07/26/22   Wendall Stade, MD  fluticasone-salmeterol (WIXELA INHUB) 100-50 MCG/ACT AEPB Inhale 1 puff into the lungs 2 (two) times daily. 08/03/22   Luciano Cutter, MD  guaiFENesin-dextromethorphan (ROBITUSSIN DM) 100-10 MG/5ML syrup Take 10 mLs by mouth every 6 (six) hours as needed for cough. 08/28/22   Dorcas Carrow, MD  isosorbide mononitrate (IMDUR) 30 MG 24 hr tablet TAKE 1 TABLET EVERY DAY Patient taking differently: Take 30 mg by mouth daily. 05/02/22   Wendall Stade, MD  metoprolol succinate (TOPROL-XL) 25 MG 24 hr tablet TAKE 1  TABLET EVERY DAY Patient taking differently: Take 25 mg by mouth daily. 05/02/22   Wendall Stade, MD  MULTAQ 400 MG tablet TAKE 1 TABLET TWICE DAILY WITH MEALS 02/23/22   Wendall Stade, MD  Multiple Vitamin (MULTIVITAMIN) capsule Take 1 capsule by mouth daily.    [provider]  nitroGLYCERIN (NITROSTAT) 0.4 MG SL tablet Place 0.4 mg under the tongue every 5 (five) minutes as needed for chest pain.    [provider]  pantoprazole (PROTONIX) 40 MG tablet Take 1 tablet (40 mg total) by mouth daily. 08/28/22   Dorcas Carrow, MD  pravastatin (PRAVACHOL) 20 MG tablet TAKE 1 TABLET EVERY DAY (NEED MD APPOINTMENT) Patient taking differently: Take 20 mg by mouth daily. 03/21/22   Wendall Stade, MD  senna-docusate (SENOKOT-S) 8.6-50 MG tablet Take 1 tablet by mouth at bedtime. 08/29/22   Dorcas Carrow, MD      Allergies    Psyllium and Other    Review of Systems   Review of Systems  Physical Exam Updated Vital Signs BP (!) 109/57   Pulse 64   Temp 97.7 F (36.5 C) (Rectal)   Resp 20   Ht  (1.676 m)   SpO2 97%   BMI 31.14 kg/m  Physical Exam Vitals and nursing note reviewed.  Constitutional:      General: She is in acute distress.  Appearance: She is well-developed. She is diaphoretic.  HENT:     Head: Normocephalic and atraumatic.  Eyes:     Pupils: Pupils are equal, round, and reactive to light.  Cardiovascular:     Rate and Rhythm: Normal rate and regular rhythm.  Pulmonary:     Effort: Tachypnea and accessory muscle usage present. No respiratory distress.     Breath sounds: No stridor. Rales present.  Abdominal:     General: There is no distension.  Musculoskeletal:        General: Swelling present.     Cervical back: Normal range of motion.  Neurological:     Mental Status: She is alert.     ED Results / Procedures / Treatments   Labs (all labs ordered are listed, but only abnormal results are displayed) Labs Reviewed  CBC WITH  DIFFERENTIAL/PLATELET - Abnormal; Notable for the following components:      Result Value   Hemoglobin 10.8 (*)    HCT 34.8 (*)    RDW 15.9 (*)    All other components within normal limits  COMPREHENSIVE METABOLIC PANEL - Abnormal; Notable for the following components:   Potassium 3.1 (*)    Glucose, Bld 200 (*)    Creatinine, Ser 1.10 (*)    Calcium 8.2 (*)    Albumin 3.1 (*)    GFR, Estimated 46 (*)    All other components within normal limits  BRAIN NATRIURETIC PEPTIDE - Abnormal; Notable for the following components:   B Natriuretic Peptide 386.6 (*)    All other components within normal limits  TROPONIN I (HIGH SENSITIVITY)    EKG EKG Interpretation  Date/Time:  Saturday October 27 2022 02:34:01 EDT Ventricular Rate:  87 PR Interval:  174 QRS Duration: 200 QT Interval:  474 QTC Calculation: 571 R Axis:   -53 Text Interpretation: paced rhythm Nonspecific IVCD with LAD LVH with secondary repolarization abnormality Confirmed by Marily Memos [phone]) on 10/27/2022 3:29:46 AM  Radiology DG Chest Portable 1 View  Result Date: 10/27/2022 CLINICAL DATA:  Dyspnea EXAM: PORTABLE CHEST 1 VIEW COMPARISON:  08/27/2022 FINDINGS: The lungs are symmetrically well expanded. Superimposed extensive perihilar airspace infiltrate has developed in keeping with alveolar pulmonary edema or diffuse pneumonic infiltrate. No pneumothorax or pleural effusion. Cardiac size is within normal limits. Left subclavian dual lead pacemaker is unchanged. No acute bone abnormality. IMPRESSION: 1. Interval development of extensive perihilar airspace infiltrate in keeping with alveolar pulmonary edema or diffuse pneumonic infiltrate. Electronically Signed   By: Helyn Numbers M.D.   On: 10/27/2022 02:57    Procedures .Critical Care  Performed by: Marily Memos, MD Authorized by: Marily Memos, MD   Critical care provider statement:    Critical care time (minutes):  30   Critical care was necessary to treat or  prevent imminent or life-threatening deterioration of the following conditions:  Respiratory failure   Critical care was time spent personally by me on the following activities:  Development of treatment plan with patient or surrogate, discussions with consultants, evaluation of patient's response to treatment, examination of patient, ordering and review of laboratory studies, ordering and review of radiographic studies, ordering and performing treatments and interventions, pulse oximetry, re-evaluation of patient's condition and review of old charts     Medications Ordered in ED Medications  potassium chloride 10 mEq in 100 mL IVPB (10 mEq Intravenous New Bag/Given 10/27/22 0440)  ondansetron (ZOFRAN) injection 4 mg (4 mg Intravenous Given 10/27/22 0250)  furosemide (LASIX) injection 80 mg (  80 mg Intravenous Given 10/27/22 0406)    ED Course/ Medical Decision Making/ A&P                             Medical Decision Making Amount and/or Complexity of Data Reviewed Labs: ordered. Radiology: ordered.  Risk Prescription drug management. Decision regarding hospitalization.  Hypoxic respiratory failure.  On my interpretation of the x-ray it appears that she has diffuse interstitial edema left greater than right but also has some fluid in the fissures.  Lasix will be started.  Blood pressure too soft for any more nitroglycerin.  Was coughing up some blood-tinged sputum, reportedly not purulent so we will hold on antibiotics for now. Patient full code and if doesn't improve her resp status soon, may need intubated which she is ok with.  On multiple reevaluations, patient is much less tachypneic, even sleeping on the bipap, not hypoxic. No UOP with Lasix yet. Labs/workup c/w CHF. Doubt pneumonia. Plan for admit to progressive for further workup/management.    Final Clinical Impression(s) / ED Diagnoses Final diagnoses:  Heart failure, unspecified HF chronicity, unspecified heart failure type   Acute respiratory failure with hypoxia    Rx / DC Orders ED Discharge Orders     None         Lyrick Worland, Barbara Cower, MD 10/27/22 [phone]

## 2022-10-27 NOTE — ED Notes (Signed)
Resp at bedside, off bipap at present.

## 2022-10-27 NOTE — ED Notes (Signed)
Pt temporarily placed on Bipap with oxygen saturations maintaining 100%. Pt reported she was going to vomit an mask was removed, pt began to cough up pink tinged sputum. Pt was placed on 15L NR.

## 2022-10-27 NOTE — Progress Notes (Addendum)
Progress Note   Patient: Dana Morrison VWU:[id number] DOB: June 28, 1924 DOA: 10/27/2022     0 DOS: the patient was seen and examined on 10/27/2022   Brief hospital course: Dana Morrison was admitted to the hospital with the working diagnosis of decompensated heart failure.   89 yo female with the past medical history of heart failure, paroxysmal atrial fibrillation, sick sinus syndrome sp Pacemaker, hypertension and dyslipidemia who presented with dyspnea. Reported on day of progressive dyspnea, associated with orthopnea and lower extremity edema.  Recent hospitalization 08/2022 for right hip fracture, and discharged to SNF, apparently her bumetanide was initially held at the nursing facility.  Hospitalization 03/204 for GI bleeding, and apixaban was held. At home she developed worsening edema and her diuretic dose was increased per instructions of her primary care provider with no much improvement in her symptoms.   Because severe symptoms EMS was called and she was found in respiratory distress, her 02 saturation was in the 50s, she was placed on Cpap and transported to the ED, where she was transitioned to Bipap, her blood pressure was 104/60 and 95/58, HR 59, RR 21 and 02 saturation 97%, lungs with no wheezing or rales, heart with S1 and S2 present and regular, abdomen with no distention and positive lower extremity edema.   Na 136, K 3,1 Cl 100 bicarbonate 23, glucose 200 bun 15 cr 1,1 BNP 386  Wbc 8,2 hgb 10,8 plt 220   Chest radiograph with cardiomegaly, bilateral hilar vascular congestion, with bilateral interstitial infiltrates, symmetric and more predominant upper lobes, cephalization of the vasculature. No effusions.  Pacemaker in place with one atrial and one right ventricular lead.   EKG 87 bpm, left axis, left bundle branch block, atrial sensing and ventricular pacing, qtc 571, with no significant ST segment or T wave changes.    Assessment and Plan: * Acute on chronic diastolic  heart failure Echocardiogram from 2019 with preserved LV systolic function.   Patient continue with hypervolemia.  Systolic blood pressure 104 mmHg  Plan to continue diuresis with IV furosemide 80 mg IV q12 Add spironolactone and empagliflozin to augment diuresis. Continue metoprolol Hold on ARB due to risk of hypotension.  Follow up repeat echocardiogram.    Paroxysmal atrial fibrillation Continue rate control with metoprolol and dronedarone.  Anticoagulation on hold due to recent GI bleeding.   Essential hypertension Continue aggressive diuresis. Continue metoprolol Continue close blood pressure monitoring.   Hypokalemia Follow up K is 3,9 Plan to continue diuresis with furosemide, spironolactone and empagliflozin. Follow up renal function and electrolytes in am.   Hyperlipidemia Continue with statin therapy.   Anemia of chronic disease Hgb has been stable. She had a recent event of GI bleed, Check iron panel.         Subjective: Patient continue to have dyspnea, has improved but not back to baseline, has lower extremity edema.   Physical Exam: Vitals:   10/27/22 0630 10/27/22 0733 10/27/22 0800 10/27/22 0830  BP: 111/63 (!) 105/48 124/67 (!) 108/52  Pulse: 76 79 78 74  Resp: (!) 37 20 (!) 24 (!) 24  Temp:  (!) 97 F (36.1 C)    TempSrc:  Tympanic    SpO2: 97% 96% 97% 97%  Height:       Neurology awake and alert ENT with mild pallor with no icterus Cardiovascular with S1 and S2 present, irregularly irregular with no gallops, rubs or murmurs Positive JVD Positive lower extremity edema Respiratory with rales bilaterally with no wheezing Abdomen  with no distention  Data Reviewed:    Family Communication: I spoke with patient's son at the bedside, we talked in detail about patient's condition, plan of care and prognosis and all questions were addressed.   Disposition: Status is: Inpatient Remains inpatient appropriate because: IV diuresis   Planned  Discharge Destination: Home      Author: Coralie Keens, MD 10/27/2022 11:04 AM  For on call review [email or web address].

## 2022-10-27 NOTE — ED Notes (Signed)
Patient's stretcher was soaked with urine, patient cleaned and new linens applied.

## 2022-10-27 NOTE — Assessment & Plan Note (Addendum)
Patient tolerated well diuresis, at the time of her discharge her serum cr is 1,0 with K at 3,7 and serum  bicarbonate at 31.  Plan to continue diuresis with bumetanide, spironolactone and SGLT 2 inh.  Follow up renal function as outpatient in 7 to 10 days.

## 2022-10-27 NOTE — H&P (Signed)
History and Physical      Dana Morrison:[id number] DOB: 04-20-1924 DOA: 10/27/2022  PCP: Charlane Ferretti, DO  Patient coming from: home   I have personally briefly reviewed patient's old medical records in [facility]  Chief Complaint: Shortness of breath  HPI: Dana Morrison is a 89 y.o. female with medical history significant for chronic diastolic heart failure, paroxysmal atrial fibrillation chronically anticoagulated on Eliquis, complicated by sick sinus syndrome status post pacemaker placement essential hypertension, hyperlipidemia, anemia of chronic disease associated send hemoglobin range 10-11.5, who is admitted to [facility] on 10/27/2022 with acute on chronic diastolic heart failure after presenting from home to [facility] ED complaining of shortness of breath.   The patient reports 1 day of shortness of breath associate with orthopnea and worsening of edema in the bilateral lower extremities.  Denies any associated chest pain, palpitations, diaphoresis, dizziness, presyncope, or syncope.  Denies any associated subjective fever, chills, rigors, generalized myalgias.  Not associate any cough, mopped assist.  No recent trauma.  Medical history notable for chronic diastolic heart failure, with most recent echocardiogram occurring in March 2019, which is notable for LVEF 55 to 60%, no focal motion arise, grade 1 diastolic dysfunction with normal right ventricular systolic function, no evidence of significant valvular pathology.  She notes good compliance with her outpatient diuretic regimen which consists of Bumex 1 mg p.o. daily, and follows with Dr. Eden Emms As her outpatient cardiologist.  She also has history of paroxysmal atrial fibrillation for which she reports good compliance with chronic anticoagulation via Eliquis.  No known baseline supplemental oxygen requirements.    ED Course:  Vital signs in the ED were notable for the following: Afebrile; heart rates in the 60s  80s; systolic blood pressures in the 90s to low 100s; respiratory rate 17-25, EMS noted her initial oxygen saturations to be in the 50s, prompting initiation of CPAP, upon which oxygen saturations improved into the 90s.  Upon arrival at [facility] emergency department, was transition to BiPAP, upon which her oxygen saturations have been in the range of 97 to 100% on 60% FiO2 via continuation of BiPAP  Labs were notable for the following: CMP notable for the following: Sodium 136, potassium 3.1, Cardide 23, creatinine 1.1, calcium, just for mild hypokalemia noted to be 9.0-3.1, liver enzymes within normal is.  BNP 3-60 without any prior BMP data points available reported comparison.  I suspect. 13.  CBC notable for will with cell count 8200, hemoglobin 10.8 relative to most recent prior hemoglobin data point of 1 on 08/25/2022, and associated nursing/recurrent findings, with a count 220.  Per my interpretation, EKG in ED demonstrated the following: Paced rhythm with heart rate 87, no evidence T wave or ST changes, clearness of association.  Imaging and additional notable ED work-up: Chest x-ray, 1 view, in comparison to most recent prior chest x-ray from 08/27/2022, performed radiology read, shows interval development of bilateral airspace opacities consistent with bilateral pulmonary edema in the absence of any overt evidence of infiltrate nor any evidence of pleural effusions nor evidence of pneumothorax.  While in the ED, the following were administered: Lasix 80 mg IV x 1, Zofran 4 mg IV x 1, potassium chloride 20 mill colons IV over 2 hours.  Subsequently, the patient was admitted for further evaluation management of acute on chronic diastolic heart failure complicated by acute Evoxac respiratory failure, presenting labs also notable for hypokalemia.     Review of Systems: As per HPI otherwise 10  point review of systems negative.   Past Medical History:  Diagnosis Date   Atrial fibrillation     Chronic diastolic heart failure 09/12/2017   Congestive heart failure (CHF)    estimated ejection fraction is 54%; diastolic   Coronary artery disease    Decreased vision 09/12/2017   Hx of myocardial infarction 2010   Hypertension    Osteoarthritis of hip 09/12/2017   Osteoarthritis of knee 09/12/2017   Paroxysmal atrial fibrillation 09/12/2017   Scalp psoriasis 09/12/2017   Seasonal allergic rhinitis due to pollen 09/12/2017   Urticaria 09/12/2017   Venous insufficiency 09/12/2017    Past Surgical History:  Procedure Laterality Date   APPENDECTOMY     ARM FRACTURE Left    FRACTURE SURGERY Bilateral    WRISTS   HIP PINNING,CANNULATED Right 08/23/2022   Procedure: CANNULATED HIP PINNING;  Surgeon: Myrene Galas, MD;  Location: [facility] OR;  Service: Orthopedics;  Laterality: Right;   LAPAROSCOPIC LYSIS OF ADHESIONS     PACEMAKER INSERTION     PLACEMENT OF STENTS     X5   REPLACEMENT TOTAL KNEE     REVISION TOTAL HIP ARTHROPLASTY Left     Social History:  reports that she quit smoking about 27 years ago. Her smoking use included cigarettes. She has a 3.75 pack-year smoking history. She has never used smokeless tobacco. She reports current alcohol use. She reports that she does not use drugs.   Allergies  Allergen Reactions   Psyllium Diarrhea   Other     Per patient she has an intolerance to some legumes. She does not have a true allergy. Pt's son confirmed this as well.     Family History  Problem Relation Age of Onset   Microcephaly Mother    Heart attack Mother    Heart attack Father    Lung cancer Brother    Other Brother        BACK PROBLEMS   Healthy Daughter    Healthy Daughter    Healthy Daughter    Healthy Son     Family history reviewed and not pertinent    Prior to Admission medications   Medication Sig Start Date End Date Taking? Authorizing Provider  acetaminophen (TYLENOL) 325 MG tablet Take 2 tablets (650 mg total) by mouth every 8 (eight) hours as needed for  moderate pain or mild pain. 08/24/22   Montez Morita, PA-C  apixaban (ELIQUIS) 5 MG TABS tablet Take 1 tablet (5 mg total) by mouth 2 (two) times daily. 06/12/22   Wendall Stade, MD  aspirin 81 MG chewable tablet Chew 1 tablet by mouth daily. 05/19/17   [provider]  bumetanide (BUMEX) 1 MG tablet Take 1 tablet (1 mg total) by mouth daily. 07/26/22   Wendall Stade, MD  fluticasone-salmeterol (WIXELA INHUB) 100-50 MCG/ACT AEPB Inhale 1 puff into the lungs 2 (two) times daily. 08/03/22   Luciano Cutter, MD  guaiFENesin-dextromethorphan (ROBITUSSIN DM) 100-10 MG/5ML syrup Take 10 mLs by mouth every 6 (six) hours as needed for cough. 08/28/22   Dorcas Carrow, MD  isosorbide mononitrate (IMDUR) 30 MG 24 hr tablet TAKE 1 TABLET EVERY DAY Patient taking differently: Take 30 mg by mouth daily. 05/02/22   Wendall Stade, MD  metoprolol succinate (TOPROL-XL) 25 MG 24 hr tablet TAKE 1 TABLET EVERY DAY Patient taking differently: Take 25 mg by mouth daily. 05/02/22   Wendall Stade, MD  MULTAQ 400 MG tablet TAKE 1 TABLET TWICE DAILY WITH  MEALS 02/23/22   Wendall Stade, MD  Multiple Vitamin (MULTIVITAMIN) capsule Take 1 capsule by mouth daily.    [provider]  nitroGLYCERIN (NITROSTAT) 0.4 MG SL tablet Place 0.4 mg under the tongue every 5 (five) minutes as needed for chest pain.    [provider]  pantoprazole (PROTONIX) 40 MG tablet Take 1 tablet (40 mg total) by mouth daily. 08/28/22   Dorcas Carrow, MD  pravastatin (PRAVACHOL) 20 MG tablet TAKE 1 TABLET EVERY DAY (NEED MD APPOINTMENT) Patient taking differently: Take 20 mg by mouth daily. 03/21/22   Wendall Stade, MD  senna-docusate (SENOKOT-S) 8.6-50 MG tablet Take 1 tablet by mouth at bedtime. 08/29/22   Dorcas Carrow, MD     Objective    Physical Exam: Vitals:   10/27/22 0445 10/27/22 0500 10/27/22 0515 10/27/22 0600  BP: 104/60 (!) 95/58 (!) 109/57 109/76  Pulse: 69 (!) 59 64 62  Resp: (!) 21   Temp:      TempSrc:      SpO2: 100% 100% 97% 99%  Height:        General: appears to be stated age; alert, oriented; mildly increased work of breathing noted;  on BiPAP. Skin: warm, dry, no rash Head:  AT/ Mouth:  Oral mucosa membranes appear moist, normal dentition Neck: supple; trachea midline Heart:  RRR; did not appreciate any M/R/G Lungs: CTAB, did not appreciate any wheezes, rales, or rhonchi Abdomen: + BS; soft, ND, NT Vascular: 2+ pedal pulses b/l; 2+ radial pulses b/l Extremities: Trace edema of the bilateral lower extremities.; no muscle wasting Neuro: strength and sensation intact in upper and lower extremities b/l     Labs on Admission: I have personally reviewed following labs and imaging studies  CBC: Recent Labs  Lab 10/27/22 0233  WBC 8.2  NEUTROABS 3.9  HGB 10.8*  HCT 34.8*  MCV 84.1  PLT 220   Basic Metabolic Panel: Recent Labs  Lab 10/27/22 0233  NA 136  K 3.1*  CL 100  CO2 23  GLUCOSE 200*  BUN 15  CREATININE 1.10*  CALCIUM 8.2*   GFR: CrCl cannot be calculated (Unknown ideal weight.). Liver Function Tests: Recent Labs  Lab 10/27/22 0233  AST 33  ALT 15  ALKPHOS 117  BILITOT 0.6  PROT 6.8  ALBUMIN 3.1*   No results for input(s): "LIPASE", "AMYLASE" in the last 168 hours. No results for input(s): "AMMONIA" in the last 168 hours. Coagulation Profile: No results for input(s): "INR", "PROTIME" in the last 168 hours. Cardiac Enzymes: No results for input(s): "CKTOTAL", "CKMB", "CKMBINDEX", "TROPONINI" in the last 168 hours. BNP (last 3 results) No results for input(s): "PROBNP" in the last 8760 hours. HbA1C: No results for input(s): "HGBA1C" in the last 72 hours. CBG: No results for input(s): "GLUCAP" in the last 168 hours. Lipid Profile: No results for input(s): "CHOL", "HDL", "LDLCALC", "TRIG", "CHOLHDL", "LDLDIRECT" in the last 72 hours. Thyroid Function Tests: No results for input(s): "TSH", "T4TOTAL", "FREET4",  "T3FREE", "THYROIDAB" in the last 72 hours. Anemia Panel: No results for input(s): "VITAMINB12", "FOLATE", "FERRITIN", "TIBC", "IRON", "RETICCTPCT" in the last 72 hours. Urine analysis: No results found for: "COLORURINE", "APPEARANCEUR", "LABSPEC", "PHURINE", "GLUCOSEU", "HGBUR", "BILIRUBINUR", "KETONESUR", "PROTEINUR", "UROBILINOGEN", "NITRITE", "LEUKOCYTESUR"  Radiological Exams on Admission: DG Chest Portable 1 View  Result Date: 10/27/2022 CLINICAL DATA:  Dyspnea EXAM: PORTABLE CHEST 1 VIEW COMPARISON:  08/27/2022 FINDINGS: The lungs are symmetrically well expanded. Superimposed extensive perihilar airspace infiltrate has developed in keeping  with alveolar pulmonary edema or diffuse pneumonic infiltrate. No pneumothorax or pleural effusion. Cardiac size is within normal limits. Left subclavian dual lead pacemaker is unchanged. No acute bone abnormality. IMPRESSION: 1. Interval development of extensive perihilar airspace infiltrate in keeping with alveolar pulmonary edema or diffuse pneumonic infiltrate. Electronically Signed   By: Helyn Numbers M.D.   On: 10/27/2022 02:57      Assessment/Plan   Principal Problem:   Acute on chronic diastolic heart failure Active Problems:   Paroxysmal atrial fibrillation   Essential hypertension   Acute hypoxic respiratory failure   Hypokalemia   Hyperlipidemia   Anemia of chronic disease      #) Acute on chronic diastolic heart failure: dx of acute decompensation on the basis of presenting 1 day of progressive shortness of breath associate with orthopnea, worsening of peripheral edema, elevated BNP, and chest x-ray showing evidence of bilateral pulmonary edema. This is in the context of a known history of chronic diastolic heart failure, with most recent echocardiogram performed March 2019, which is notable for LVEF 55 to 60% as well as grade 1 diastolic dysfunction, normal right ventricular systolic function, and additional results as conveyed  above. Etiology leading to presenting acutely decompensated heart failure is currently unclear. Patient conveys good compliance with home diuretic therapy, which consists of Bumex 1 mg p.o. daily.   Overall, ACS leading to presenting acutely decompensated heart failure appears less likely at this time in the absence of any recent CP, presenting EKG showing no evidence of acute ischemic changes, as well as non-elevated troponin.   Of note, patient received Lasix 80 mg IV x 1 while in the ED today. Presentation warrants additional IV diuresis, as further detailed below, with close monitoring of ensuing renal function, electrolytes, and volume status, as further noted below. Will also closely monitor ensuing HR as an additional means to evaluate volume status and help guide subsequent diuresis decision-making. As patient is already on a BB at home, will plan to continue this, but transiently reduce dose by half.  Additionally, as it has been over 5 years since her most recent prior echocardiogram, will order updated echocardiogram to occur in the morning.   Plan: monitor strict I's & O's and daily weights. Monitor on telemetry, including trend in HR in response to diuresis. Monitor continuous pulse oximetry. CMP in the morning, including for monitoring trend of potassium, bicarbonate, and renal function in response to interval diuresis efforts. Check serum magnesium level.  Potassium chloride 20 mill equivalents p.o. daily.  Close monitoring of ensuing blood pressure response to diuresis efforts, including to help guide need for improvement in afterload reduction in order to optimize cardiac output. Lasix 80 mg IV twice daily. Continue outpatient metoprolol succinate, but reduced dose to 12.5 mg p.o. daily.  Echocardiogram ordered for the morning.              #) Acute hypoxic respiratory failure: in the context of acute respiratory symptoms and no known baseline supplemental O2 requirements,  presenting O2 sat note to be 50s on room air, socially improving into the low 90s on CPAP before being transitioned to BiPAP in emergency department this evening, upon which is seen oxygen saturation has been in the high 90s to 100% on 60% FiO2. Appears to be on basis of acute on chronic diastolic heart failure, as further detailed above. Presenting CXR shows evidence of bilateral pulmonary edema, but no evidence of additional acute cardiopulmonary process, including no evidence of infiltrate, pleural effusion,  or pneumothorax.  No known chronic underlying pulmonary conditions .  In terms of other considered etiologies, ACS appears less likely at this time in the absence of any recent CP and in the context of negative troponin and presenting EKG showing no e/o acute ischemic process. Clinically, presentation is less suggestive of acute PE at this time and is rendered further less likely by the patient's good compliance with her chronic anticoagulation on Eliquis in the setting of history of paroxysmal atrial fibrillation.   Plan: further evaluation/management of presenting acute on chronic diastolic heart failure, as above, including plans for additional IV diuresis as outlined above. Monitor continuous pulse ox with prn supplemental O2 to maintain O2 sats greater than or equal to 92%. monitor on telemetry. CMP/CBC in the AM. Check serum Mg and Phos levels. Check blood gas.  Add on procalcitonin level.               #) Hypokalemia: Presents for potassium level noted to be 3.1, notable given plans for additional IV diuresis.  She has received a total of 20 mill equivalents of IV potassium chloride in the emergency department overnight.  Plan: Add on serum magnesium level.  Monitor on telemetry.  Have ordered potassium chloride 20 equivalents p.o. daily given plan for additional IV diuresis.              #) Paroxysmal atrial fibrillation: Documented history of such. In setting of  CHA2DS2-VASc score of 6, there is an indication for chronic anticoagulation for thromboembolic prophylaxis. Consistent with this, patient is chronically anticoagulated on Eliquis. Home AV nodal blocking regimen: Metoprolol succinate.  Most recent echocardiogram occurred in March 2019, with additional results as described above. Presenting EKG paced rhythm without overt evidence of acute ischemic changes, while noting that her paroxysmal atrial fibrillation has been complicated by sick sinus syndrome status post pacemaker placement.  In addition to the aforementioned AV nodal blocking intervention, she is also on rhythm control via Multaq as an outpatient.  Plan: monitor strict I's & O's and daily weights. CMP/CBC in AM. Check serum mag level. Continue home AV nodal blocking regimen, but reduce outpatient metoprolol succinate by half, noting that this updated dose will be 12.5 mg p.o. daily in the context of presenting acutely compensated heart failure as further outlined above.  Continue Multaq and Eliquis.  Monitor on telemetry.  Follow-up result of pursuit of updated echocardiogram in the morning, as described above.               #) Essential Hypertension: documented h/o such, with outpatient antihypertensive regimen including Imdur, metoprolol succinate.  SBP's in the ED today: 90s to low 100s mmHg. given borderline soft blood pressures and plan for additional IV diuresis, will hold him Imdur for now.  Plan: Close monitoring of subsequent BP via routine VS. hold him Imdur for now, as above.  Home beta-blocker dose by half in the context of acute on chronic diastolic heart failure, as above.              #) Hyperlipidemia: documented h/o such. On pravastatin as outpatient.   Plan: continue home statin.               #) Anemia of chronic disease: Documented history of such, a/w with baseline hgb range 10-11.5, with presenting hgb consistent with this range, in the  absence of any overt evidence of active bleed.     Plan: Repeat CBC in the morning.  DVT prophylaxis: SCD's plus continuation, Eliquis Code Status: Full code Family Communication: Case was discussed with the patient's son, who is present at bedside Disposition Plan: Per Rounding Team Consults called: none;  Admission status: Inpatient     I SPENT GREATER THAN 75  MINUTES IN CLINICAL CARE TIME/MEDICAL DECISION-MAKING IN COMPLETING THIS ADMISSION.      Chaney Born Vanessa Kampf DO [facility]  From 7PM - 7AM   10/27/2022, 6:17 AM

## 2022-10-27 NOTE — ED Notes (Signed)
Lunch tray given. 

## 2022-10-27 NOTE — Assessment & Plan Note (Signed)
Hgb has been stable. She had a recent event of GI bleed, Check iron panel.

## 2022-10-28 ENCOUNTER — Inpatient Hospital Stay (HOSPITAL_COMMUNITY): Payer: Medicare HMO

## 2022-10-28 DIAGNOSIS — I5031 Acute diastolic (congestive) heart failure: Secondary | ICD-10-CM | POA: Diagnosis not present

## 2022-10-28 DIAGNOSIS — I480 Paroxysmal atrial fibrillation: Secondary | ICD-10-CM | POA: Diagnosis not present

## 2022-10-28 DIAGNOSIS — D638 Anemia in other chronic diseases classified elsewhere: Secondary | ICD-10-CM | POA: Diagnosis not present

## 2022-10-28 DIAGNOSIS — I10 Essential (primary) hypertension: Secondary | ICD-10-CM | POA: Diagnosis not present

## 2022-10-28 DIAGNOSIS — I5033 Acute on chronic diastolic (congestive) heart failure: Secondary | ICD-10-CM | POA: Diagnosis not present

## 2022-10-28 LAB — BASIC METABOLIC PANEL
Anion gap: 10 (ref 5–15)
BUN: 18 mg/dL (ref 8–23)
CO2: 29 mmol/L (ref 22–32)
Calcium: 7.8 mg/dL — ABNORMAL LOW (ref 8.9–10.3)
Chloride: 101 mmol/L (ref 98–111)
Creatinine, Ser: 1.1 mg/dL — ABNORMAL HIGH (ref 0.44–1.00)
GFR, Estimated: 46 mL/min — ABNORMAL LOW (ref >60)
Glucose, Bld: 98 mg/dL (ref 70–99)
Potassium: 3.6 mmol/L (ref 3.5–5.1)
Sodium: 140 mmol/L (ref 135–145)

## 2022-10-28 LAB — ECHOCARDIOGRAM COMPLETE
AR max vel: 2.82 cm2
AV Area VTI: 3.04 cm2
AV Area mean vel: 3.12 cm2
AV Mean grad: 3 mmHg
AV Peak grad: 5.5 mmHg
Ao pk vel: 1.17 m/s
Area-P 1/2: 2.55 cm2
Calc EF: 56.3 %
Height: 66 in
MV M vel: 4.26 m/s
MV Peak grad: 72.6 mmHg
S' Lateral: 3.5 cm
Single Plane A2C EF: 61.2 %
Single Plane A4C EF: 49 %

## 2022-10-28 LAB — MAGNESIUM: Magnesium: 2 mg/dL (ref 1.7–2.4)

## 2022-10-28 MED ORDER — DM-GUAIFENESIN ER 30-600 MG PO TB12
1.0000 | ORAL_TABLET | Freq: Two times a day (BID) | ORAL | Status: DC
  Administered 2022-10-28 – 2022-10-30 (×4): 1 via ORAL
  Filled 2022-10-28 (×5): qty 1

## 2022-10-28 MED ORDER — PERFLUTREN LIPID MICROSPHERE
1.0000 mL | INTRAVENOUS | Status: AC | PRN
  Administered 2022-10-28: 2 mL via INTRAVENOUS

## 2022-10-28 MED ORDER — METHOCARBAMOL 500 MG PO TABS
500.0000 mg | ORAL_TABLET | Freq: Three times a day (TID) | ORAL | Status: DC | PRN
  Administered 2022-10-28: 500 mg via ORAL
  Filled 2022-10-28: qty 1

## 2022-10-28 NOTE — Progress Notes (Signed)
Page to attending to meet family member at bedside to address clinical concerns. Son reports slurred speech after waking patient upon arrival to unit. Upon assessment patient's speech was unchanged from previous morning conversations. Patients speech was clear, she was alert and oriented to time, place, person, and situation. She follows commands and was able to participate in patient care. No changes noted from morning assessment. No change in plan of care at this time. MD asked to follow up. Will continue to assess for any changes in patient condition.

## 2022-10-28 NOTE — Progress Notes (Signed)
Progress Note   Patient: Dana Morrison ZOX:[id number] DOB: 01-01-1924 DOA: 10/27/2022     1 DOS: the patient was seen and examined on 10/28/2022   Brief hospital course: Mrs. Adolph was admitted to the hospital with the working diagnosis of decompensated heart failure.   89 yo female with the past medical history of heart failure, paroxysmal atrial fibrillation, sick sinus syndrome sp Pacemaker, hypertension and dyslipidemia who presented with dyspnea. Reported on day of progressive dyspnea, associated with orthopnea and lower extremity edema.  Recent hospitalization 08/2022 for right hip fracture, and discharged to SNF, apparently her bumetanide was initially held at the nursing facility.  Hospitalization 03/204 for GI bleeding, and apixaban was held. At home she developed worsening edema and her diuretic dose was increased per instructions of her primary care provider with no much improvement in her symptoms.   Because severe symptoms EMS was called and she was found in respiratory distress, her 02 saturation was in the 50s, she was placed on Cpap and transported to the ED, where she was transitioned to Bipap, her blood pressure was 104/60 and 95/58, HR 59, RR 21 and 02 saturation 97%, lungs with no wheezing or rales, heart with S1 and S2 present and regular, abdomen with no distention and positive lower extremity edema.   Na 136, K 3,1 Cl 100 bicarbonate 23, glucose 200 bun 15 cr 1,1 BNP 386  Wbc 8,2 hgb 10,8 plt 220   Chest radiograph with cardiomegaly, bilateral hilar vascular congestion, with bilateral interstitial infiltrates, symmetric and more predominant upper lobes, cephalization of the vasculature. No effusions.  Pacemaker in place with one atrial and one right ventricular lead.   EKG 87 bpm, left axis, left bundle branch block, atrial sensing and ventricular pacing, qtc 571, with no significant ST segment or T wave changes.    Assessment and Plan: * Acute on chronic diastolic  heart failure -Echocardiogram from 2019 with preserved LV systolic function. -Follow-up repeat echocardiogram for updates and interpretation -Continue daily weights, strict I's and O's, low-sodium diet -Continue IV diuresis -Continue treatment with spironolactone and Farxiga -Continue metoprolol -Patient's blood pressure soft and not allowing any further meds to be added currently -Continue to wean off oxygen supplementation and follow clinical response.  Paroxysmal atrial fibrillation -Continue rate control with metoprolol and amiodarone. -Continue treatment with Eliquis for secondary prevention. -Given recent lack of protection and symptoms described by family members given concern for TIA will check CT head without contrast and wanted to be thorough on her evaluation. -Continue to follow response.  Essential hypertension -Continue aggressive diuresis. -Continue metoprolol and spironolactone; follow vital signs trend. -Low-sodium diet discussed with patient and family at bedside.  Hypokalemia -Potassium repleted and is stable currently -Continue the use of aspirin potassium medication (spironolactone) -Follow electrolytes trend and further replete as needed.  Hyperlipidemia -Continue statin -Heart healthy diet discussed with patient.  Anemia of chronic disease -Hgb has been stable. She had a recent event of GI bleed -Continue to follow hemoglobin trend/stability.    Subjective: Patient reporting improvement in her breathing; no chest pain, no nausea, no vomiting.  Oxygen supplementation down to 2-3 L [place] ( from 5L needed on admission).  Irving Burton reported intermittent episode of slower speech, confusion/sleepiness and muscle cramps.  Physical Exam: Vitals:   10/27/22 1958 10/28/22 0020 10/28/22 0502 10/28/22 1145  BP: (!) 100/51 (!) 112/42 (!) 109/52 (!) 108/52  Pulse: 78 82  89  Resp: Temp: 97.6 F (36.4 C) 97.8  F (36.6 C) 98.6 F (37 C) 97.9 F (36.6 C)   TempSrc: Oral Oral Oral Oral  SpO2: 96% 100%  99%  Height:       General exam: Alert, awake, oriented x 3, demonstrating intermittent coughing spells and is still requiring 2-3 L oxygen supplementation to maintain saturation.  Express breathing is improving and has noticed decreased in her overall fluid overload status. Respiratory system: Decreased breath sounds at the bases; positive scattered rhonchi.  No using accessory muscle.  2.5 L nasal cannula supplementation in place at time of evaluation. Cardiovascular system: Rate controlled, no rubs, no gallops, no JVD appreciated on exam. Gastrointestinal system: Abdomen is nondistended, soft and nontender. No organomegaly or masses felt. Normal bowel sounds heard. Central nervous system: Alert and oriented. No focal neurological deficits appreciated at time of my evaluation; patient's son and daughter-in-law reported intermittent episodes of slurred speech, transient confusion and inability to keep herself awake.Marland Kitchen Extremities: No cyanosis or clubbing; trace to 1+ edema appreciated bilaterally. Skin: No petechiae. Psychiatry: Judgement and insight appear normal. Mood & affect appropriate.   Data Reviewed: Basic metabolic panel: Sodium 140, potassium 3.6, chloride 101, bicarb 29, BUN 18, creatinine 1.10 and GFR 46. Magnesium: 2.0  Family Communication: I spoke with patient's son and daughter-in-law at bedside (daughter-in-law is occupational therapist: at [employer]).   Disposition: Status is: Inpatient Remains inpatient appropriate because: IV diuresis    Planned Discharge Destination: Home    Author: Vassie Loll, MD 10/28/2022 6:29 PM  For on call review [email or web address].

## 2022-10-28 NOTE — Plan of Care (Signed)

## 2022-10-29 ENCOUNTER — Inpatient Hospital Stay (HOSPITAL_COMMUNITY): Payer: Medicare HMO

## 2022-10-29 DIAGNOSIS — E876 Hypokalemia: Secondary | ICD-10-CM | POA: Diagnosis not present

## 2022-10-29 DIAGNOSIS — I5033 Acute on chronic diastolic (congestive) heart failure: Secondary | ICD-10-CM | POA: Diagnosis not present

## 2022-10-29 DIAGNOSIS — I480 Paroxysmal atrial fibrillation: Secondary | ICD-10-CM | POA: Diagnosis not present

## 2022-10-29 DIAGNOSIS — I10 Essential (primary) hypertension: Secondary | ICD-10-CM | POA: Diagnosis not present

## 2022-10-29 LAB — BASIC METABOLIC PANEL
Anion gap: 9 (ref 5–15)
BUN: 20 mg/dL (ref 8–23)
CO2: 35 mmol/L — ABNORMAL HIGH (ref 22–32)
Calcium: 7.9 mg/dL — ABNORMAL LOW (ref 8.9–10.3)
Chloride: 98 mmol/L (ref 98–111)
Creatinine, Ser: 0.93 mg/dL (ref 0.44–1.00)
GFR, Estimated: 56 mL/min — ABNORMAL LOW (ref >60)
Glucose, Bld: 109 mg/dL — ABNORMAL HIGH (ref 70–99)
Potassium: 3.1 mmol/L — ABNORMAL LOW (ref 3.5–5.1)
Sodium: 142 mmol/L (ref 135–145)

## 2022-10-29 LAB — MAGNESIUM: Magnesium: 2.2 mg/dL (ref 1.7–2.4)

## 2022-10-29 LAB — PHOSPHORUS: Phosphorus: 4.4 mg/dL (ref 2.5–4.6)

## 2022-10-29 MED ORDER — BISACODYL 5 MG PO TBEC
5.0000 mg | DELAYED_RELEASE_TABLET | Freq: Every day | ORAL | Status: DC | PRN
  Administered 2022-10-29: 5 mg via ORAL
  Filled 2022-10-29: qty 1

## 2022-10-29 MED ORDER — POTASSIUM CHLORIDE CRYS ER 20 MEQ PO TBCR
40.0000 meq | EXTENDED_RELEASE_TABLET | ORAL | Status: AC
  Administered 2022-10-29 (×2): 40 meq via ORAL
  Filled 2022-10-29 (×2): qty 2

## 2022-10-29 NOTE — Progress Notes (Signed)
Heart Failure Navigator Progress Note  Assessed for Heart & Vascular TOC clinic readiness.  Patient EF 35-40% , Has a scheduled [facility] appointment on 5/2/202. No TOC per Dr. Ella Jubilee. .   Navigator will sign off at this time. Rhae Hammock, BSN, RN Heart Failure Teacher, adult education Only

## 2022-10-29 NOTE — TOC Initial Note (Signed)
Transition of Care [facility]) - Initial/Assessment Note    Patient Details  Name: Dana Morrison MRN: [id number] Date of Birth: 09-19-1923  [facility]) CM/SW Contact:    Gala Lewandowsky, RN Phone Number: 10/29/2022, 4:34 PM  Clinical Narrative: Patient presented for shortness of breath. PTA Patient was from home alone in an apartment. Case Manager was able to speak with son regarding plan of care. Patient has DME rolling walker, bedside commode, and wheelchair. Son states the patient has caregivers 4 hours a day M- Sat. The caregivers may have up to one more week in the home. Son states patient is currently active with Home Health; however, he is unable to state which agency. Son to call Case Manager back with information.                   Expected Discharge Plan: Home w Home Health Services Barriers to Discharge: Continued Medical Work up   Patient Goals and CMS Choice     Choice offered to / list presented to : Adult Children      Expected Discharge Plan and Services In-house Referral: NA Discharge Planning Services: CM Consult Post Acute Care Choice: Home Health Living arrangements for the past 2 months: Apartment                   DME Agency: NA       HH Arranged: PT, OT   Prior Living Arrangements/Services Living arrangements for the past 2 months: Apartment Lives with:: Self Patient language and need for interpreter reviewed:: Yes Do you feel safe going back to the place where you live?: Yes      Need for Family Participation in Patient Care: Yes (Comment) Care giver support system in place?: Yes (comment) Current home services: DME (rolling walker, bedside commode, and wheelchair.) Criminal Activity/Legal Involvement Pertinent to Current Situation/Hospitalization: No - Comment as needed  Permission Sought/Granted Permission sought to share information with : Family Supports, Case Manager   Emotional Assessment Appearance:: Appears stated  age Attitude/Demeanor/Rapport: Unable to Assess Affect (typically observed): Unable to Assess Orientation: : Oriented to Self Alcohol / Substance Use: Not Applicable Psych Involvement: No (comment)  Admission diagnosis:  Acute on chronic diastolic heart failure [I50.33] Acute respiratory failure with hypoxia [J96.01] Heart failure, unspecified HF chronicity, unspecified heart failure type [I50.9] Patient Active Problem List   Diagnosis Date Noted   Acute on chronic diastolic heart failure 10/27/2022   Acute respiratory failure with hypoxia 10/27/2022   Hypokalemia 10/27/2022   Hyperlipidemia 10/27/2022   Anemia of chronic disease 10/27/2022   Closed right hip fracture, initial encounter 08/21/2022   Coronary artery disease 08/21/2022   Chronic bronchitis 08/21/2022   Sick sinus syndrome 09/22/2020   Pacemaker 09/22/2020   Seasonal allergic rhinitis due to pollen 09/12/2017   Paroxysmal atrial fibrillation 09/12/2017   Chronic diastolic heart failure 09/12/2017   Decreased vision 09/12/2017   Venous insufficiency 09/12/2017   Scalp psoriasis 09/12/2017   Urticaria 09/12/2017   Osteoarthritis of knee 09/12/2017   Osteoarthritis of hip 09/12/2017   Essential hypertension 09/12/2017   PCP:  Charlane Ferretti, DO Pharmacy:   [facility] [phone] - [place], [place] - [address] [place] [address] Phone: [phone] Fax: [phone]  [facility] - [place], [place] - [address] [place] [address] Phone: [phone] Fax: [phone]  [facility] [address] [place], [place] - [address] [facility]  [address] [place] [address] Phone: [phone] Fax: [phone]  [id number] [place],  - [address] AT CORNER OF [facility] [address] [place] [address] Phone: [phone] Fax: [phone]  [facility] [address] [place] [address] Phone: [phone] Fax: [phone]  Social Determinants of Health (SDOH) Social History: SDOH Screenings   Food Insecurity: No Food Insecurity (08/22/2022)  Housing: Low Risk  (08/22/2022)  Transportation Needs: Unknown (08/22/2022)  Utilities: Not At Risk (08/22/2022)  Tobacco Use: Medium Risk (10/27/2022)   Readmission Risk Interventions     No data to display

## 2022-10-29 NOTE — Evaluation (Signed)
Physical Therapy Evaluation Patient Details Name: Dana Morrison MRN: [id number] DOB: July 26, 1923 Today's Date: 10/29/2022  History of Present Illness  89 year old female who was admitted on 10/27/22 for SOB. Past medical history significant for chronic diastolic heart failure, paroxysmal atrial fibrillation chronically anticoagulated on Eliquis, complicated by sick sinus syndrome status post pacemaker placement essential hypertension, hyperlipidemia, anemia of chronic disease associated send hemoglobin range 10-11.5.  Clinical Impression  Pt presents with admitting diagnosis above. Pt reports that she currently lives alone in an ILF and is currently receiving HHPT 2x/week. Pt was previously admitted in 08/2022 for a hip fracture and states that she just returned home from SNF on March 30th. Today, pt was able to perform bed mobility and transfers at Mod I/supervision level. Pt was then able to transfer from bed to chair with RW at min guard level. Further gait was deferred due to pt wanting to eat breakfast after coming back from CT scan however reports that she feels at her baseline. At baseline, pt states that she ambulates short distances with rollator. Plan to trial gait with rollator tomorrow and anticipate that pt will be able to resume HHPT upon DC.      Recommendations for follow up therapy are one component of a multi-disciplinary discharge planning process, led by the attending physician.  Recommendations may be updated based on patient status, additional functional criteria and insurance authorization.  Follow Up Recommendations       Assistance Recommended at Discharge PRN  Patient can return home with the following  A little help with walking and/or transfers;A little help with bathing/dressing/bathroom;Assistance with cooking/housework;Direct supervision/assist for medications management;Assist for transportation;Help with stairs or ramp for entrance    Equipment Recommendations None  recommended by PT  Recommendations for Other Services       Functional Status Assessment Patient has had a recent decline in their functional status and demonstrates the ability to make significant improvements in function in a reasonable and predictable amount of time.     Precautions / Restrictions Precautions Precautions: Fall Restrictions Weight Bearing Restrictions: No      Mobility  Bed Mobility Overal bed mobility: Modified Independent             General bed mobility comments: HOB elevated    Transfers Overall transfer level: Needs assistance Equipment used: Rolling walker (2 wheels) Transfers: Sit to/from Stand, Bed to chair/wheelchair/BSC Sit to Stand: Supervision   Step pivot transfers: Min guard       General transfer comment: Further gait deferred due to pt wanted to eat breakfast afte just getting back from procedure. Pt states that she feels like she is at her baseline.    Ambulation/Gait               General Gait Details: Deferred  Stairs            Wheelchair Mobility    Modified Rankin (Stroke Patients Only)       Balance Overall balance assessment: Mild deficits observed, not formally tested                                           Pertinent Vitals/Pain Pain Assessment Pain Assessment: No/denies pain    Home Living Family/patient expects to be discharged to:: Private residence (ILF) Living Arrangements: Alone Available Help at Discharge: Available PRN/intermittently;Home health;Neighbor Type of Home: Independent living facility Home  Access: Level entry       Home Layout: One level Home Equipment: Rollator (4 wheels);Shower seat - built in;Grab bars - toilet;Grab bars - tub/shower;Wheelchair - manual;Other (comment) (Lift bed)      Prior Function Prior Level of Function : Independent/Modified Independent;History of Falls (last six months) (Pt states she had 1 fall while at [facility])              Mobility Comments: rollator with no assist in IL situation ADLs Comments: Ind     Hand Dominance   Dominant Hand: Right    Extremity/Trunk Assessment   Upper Extremity Assessment Upper Extremity Assessment: Overall WFL for tasks assessed    Lower Extremity Assessment Lower Extremity Assessment: Overall WFL for tasks assessed    Cervical / Trunk Assessment Cervical / Trunk Assessment: Kyphotic  Communication   Communication: HOH  Cognition Arousal/Alertness: Awake/alert Behavior During Therapy: WFL for tasks assessed/performed Overall Cognitive Status: Within Functional Limits for tasks assessed                                          General Comments General comments (skin integrity, edema, etc.): 96% on 3L.    Exercises     Assessment/Plan    PT Assessment Patient needs continued PT services  PT Problem List Decreased strength;Decreased activity tolerance;Decreased balance;Decreased mobility;Decreased knowledge of use of DME;Decreased safety awareness;Cardiopulmonary status limiting activity       PT Treatment Interventions DME instruction;Gait training;Functional mobility training;Therapeutic activities;Therapeutic exercise;Balance training;Patient/family education    PT Goals (Current goals can be found in the Care Plan section)  Acute Rehab PT Goals Patient Stated Goal: To go home PT Goal Formulation: With patient Time For Goal Achievement: 11/12/22 Potential to Achieve Goals: Good    Frequency Min 1X/week     Co-evaluation               AM-PAC PT "6 Clicks" Mobility  Outcome Measure Help needed turning from your back to your side while in a flat bed without using bedrails?: None Help needed moving from lying on your back to sitting on the side of a flat bed without using bedrails?: None Help needed moving to and from a bed to a chair (including a wheelchair)?: A Little Help needed standing up from a chair using your arms  (e.g., wheelchair or bedside chair)?: A Little Help needed to walk in hospital room?: A Little Help needed climbing 3-5 steps with a railing? : A Lot 6 Click Score: 19    End of Session Equipment Utilized During Treatment: Gait belt;Oxygen Activity Tolerance: Patient tolerated treatment well Patient left: in chair;with call bell/phone within reach;with nursing/sitter in room Nurse Communication: Mobility status PT Visit Diagnosis: Other abnormalities of gait and mobility (R26.89)    Time: 0937-1000 PT Time Calculation (min) (ACUTE ONLY): 23 min   Charges:   PT Evaluation $PT Eval Low Complexity: 1 Low PT Treatments $Therapeutic Activity: 8-22 mins        Dana Morrison, PT, DPT Acute Rehab Services [phone]   [name] 10/29/2022, 11:54 AM

## 2022-10-29 NOTE — Progress Notes (Addendum)
Progress Note   Patient: Arthur Aydelotte WGN:[id number] DOB: [birth year] DOA: 10/27/2022     2 DOS: the patient was seen and examined on 10/29/2022   Brief hospital course: Mrs. Boyden was admitted to the hospital with the working diagnosis of decompensated heart failure.   89 yo female with the past medical history of heart failure, paroxysmal atrial fibrillation, sick sinus syndrome sp Pacemaker, hypertension and dyslipidemia who presented with dyspnea. Reported on day of progressive dyspnea, associated with orthopnea and lower extremity edema.  Recent hospitalization 08/2022 for right hip fracture, and discharged to SNF, apparently her bumetanide was initially held at the nursing facility.  Hospitalization 03/204 for GI bleeding, and apixaban was held. At home she developed worsening edema and her diuretic dose was increased per instructions of her primary care provider with no much improvement in her symptoms.   Because severe symptoms EMS was called and she was found in respiratory distress, her 02 saturation was in the 50s, she was placed on Cpap and transported to the ED, where she was transitioned to Bipap, her blood pressure was 104/60 and 95/58, HR 59, RR 21 and 02 saturation 97%, lungs with no wheezing or rales, heart with S1 and S2 present and regular, abdomen with no distention and positive lower extremity edema.   Na 136, K 3,1 Cl 100 bicarbonate 23, glucose 200 bun 15 cr 1,1 BNP 386  Wbc 8,2 hgb 10,8 plt 220   Chest radiograph with cardiomegaly, bilateral hilar vascular congestion, with bilateral interstitial infiltrates, symmetric and more predominant upper lobes, cephalization of the vasculature. No effusions.  Pacemaker in place with one atrial and one right ventricular lead.   EKG 87 bpm, left axis, left bundle branch block, atrial sensing and ventricular pacing, qtc 571, with no significant ST segment or T wave changes.   02/22 patient is tolerating well diuresis with  improvement in volume status.   Assessment and Plan: * Acute on chronic diastolic heart failure Echocardiogram with reduced LV systolic function 35 to 40%, global hypokinesis, no LVH, RV mild mild enlargement, RVSP 53,4 mmHg. No significant valvular disease.  Paradoxical septal movement due to RV pacing   Urine output is 2,300 ml Systolic blood pressure is 106 to 120 mmHg.   Plan to continue diuresis with furosemide 80 mg IV q12 hrs Continue with empagliflozin and spironolactone. Hold on ARB due to risk of hypotension.  Continue metoprolol succinate.   Paroxysmal atrial fibrillation -Continue rate control with metoprolol and dronedarone. -Continue treatment with Eliquis for secondary prevention.  Head CT with no acute changes.  Regressed left anterior scalp hematoma.   Essential hypertension Blood pressure stable, continue diuresis and metoprolol. Hold on ARB for now.   Hypokalemia Renal function with serum cr at 0,93 with K at 3,1 and serum bicarbonate at 35. Na is 142 and mg at 2,2  Plan to continue furosemide IV, spironolactone and SGLT 2 inh Possible transition to po furosemide in am.   Hyperlipidemia -Continue statin -Heart healthy diet discussed with patient.  Anemia of chronic disease -Hgb has been stable. She had a recent event of GI bleed -Continue to follow hemoglobin trend/stability.     Subjective: Patient is awake and alert, dyspnea and edema have improved, tolerating po well.  Physical Exam: Vitals:   10/28/22 2025 10/29/22 0012 10/29/22 0343 10/29/22 0742  BP:  (!) 102/51 (!) 106/41 (!) 112/48  Pulse:  78 69 60  Resp:  Temp:  97.9 F (36.6 C) 97.8  F (36.6 C) 98 F (36.7 C)  TempSrc:  Oral Oral Oral  SpO2: 95% 95% 100% 98%  Weight:   79.2 kg   Height:        Neurology awake and alert ENT with mild pallor Cardiovascular with S1 and S2 present and regular with no gallops.  Respiratory with no mild rales at bases with no wheezing,  no rhonchi Abdomen with no distention  No lower extremity edema  Data Reviewed:    Family Communication: no family at the bedside.  I spoke with patient's son, over the phone, we talked in detail about patient's condition, plan of care and prognosis and all questions were addressed.    Disposition: Status is: Inpatient Remains inpatient appropriate because: IV diuresis   Planned Discharge Destination: Home      Author: Coralie Keens, MD 10/29/2022 11:52 AM  For on call review [email or web address].

## 2022-10-29 NOTE — Progress Notes (Signed)
PT Cancellation Note  Patient Details Name: Dana Morrison MRN: [id number] DOB: 1923-09-16   Cancelled Treatment:    Reason Eval/Treat Not Completed: Patient at procedure or test/unavailable (Pt currently off the floor at CT scan. Will follow up if time permits.)   [name] 10/29/2022, 8:12 AM

## 2022-10-30 ENCOUNTER — Other Ambulatory Visit (HOSPITAL_COMMUNITY): Payer: Self-pay

## 2022-10-30 DIAGNOSIS — I10 Essential (primary) hypertension: Secondary | ICD-10-CM | POA: Diagnosis not present

## 2022-10-30 DIAGNOSIS — I5033 Acute on chronic diastolic (congestive) heart failure: Secondary | ICD-10-CM | POA: Diagnosis not present

## 2022-10-30 DIAGNOSIS — I480 Paroxysmal atrial fibrillation: Secondary | ICD-10-CM | POA: Diagnosis not present

## 2022-10-30 DIAGNOSIS — E876 Hypokalemia: Secondary | ICD-10-CM | POA: Diagnosis not present

## 2022-10-30 LAB — BASIC METABOLIC PANEL
Anion gap: 10 (ref 5–15)
BUN: 20 mg/dL (ref 8–23)
CO2: 31 mmol/L (ref 22–32)
Calcium: 8.4 mg/dL — ABNORMAL LOW (ref 8.9–10.3)
Chloride: 99 mmol/L (ref 98–111)
Creatinine, Ser: 1.04 mg/dL — ABNORMAL HIGH (ref 0.44–1.00)
GFR, Estimated: 49 mL/min — ABNORMAL LOW (ref >60)
Glucose, Bld: 103 mg/dL — ABNORMAL HIGH (ref 70–99)
Potassium: 3.7 mmol/L (ref 3.5–5.1)
Sodium: 140 mmol/L (ref 135–145)

## 2022-10-30 MED ORDER — SPIRONOLACTONE 25 MG PO TABS
12.5000 mg | ORAL_TABLET | Freq: Every day | ORAL | 0 refills | Status: AC
  Filled 2022-10-30: qty 15, 30d supply, fill #0

## 2022-10-30 MED ORDER — BUMETANIDE 1 MG PO TABS
1.0000 mg | ORAL_TABLET | Freq: Every day | ORAL | 1 refills | Status: AC
  Filled 2022-10-30: qty 30, 30d supply, fill #0
  Filled 2022-10-30: qty 90, 90d supply, fill #0

## 2022-10-30 MED ORDER — EMPAGLIFLOZIN 10 MG PO TABS
10.0000 mg | ORAL_TABLET | Freq: Every day | ORAL | 0 refills | Status: AC
  Filled 2022-10-30: qty 30, 30d supply, fill #0

## 2022-10-30 NOTE — Care Management Important Message (Signed)
Important Message  Patient Details  Name: Dana Morrison MRN: [id number] Date of Birth: August 28, 1923   Medicare Important Message Given:  Yes     Renie Ora 10/30/2022, 9:26 AM

## 2022-10-30 NOTE — Progress Notes (Signed)
Physical Therapy Treatment Patient Details Name: Dana Morrison MRN: [id number] DOB: [birth year] Today's Date: 10/30/2022   History of Present Illness 89 year old female who was admitted on 10/27/22 for SOB. Past medical history significant for chronic diastolic heart failure, paroxysmal atrial fibrillation chronically anticoagulated on Eliquis, complicated by sick sinus syndrome status post pacemaker placement essential hypertension, hyperlipidemia, anemia of chronic disease associated send hemoglobin range 10-11.5.    PT Comments    Pt tolerated treatment well today. Pt was able to ambulate short distance in room to chair with rollator at supervision level so pt could sit up for breakfast. Pt anticipates possible DC home today. Pt presents at or near baseline mobility. Pt has no further acute PT needs and will be signing off. Re consult PT if mobility status changes.   Recommendations for follow up therapy are one component of a multi-disciplinary discharge planning process, led by the attending physician.  Recommendations may be updated based on patient status, additional functional criteria and insurance authorization.  Follow Up Recommendations       Assistance Recommended at Discharge PRN  Patient can return home with the following A little help with walking and/or transfers;A little help with bathing/dressing/bathroom;Assistance with cooking/housework;Direct supervision/assist for medications management;Assist for transportation;Help with stairs or ramp for entrance   Equipment Recommendations  None recommended by PT    Recommendations for Other Services       Precautions / Restrictions Precautions Precautions: Fall Restrictions Weight Bearing Restrictions: No     Mobility  Bed Mobility Overal bed mobility: Modified Independent                  Transfers Overall transfer level: Needs assistance Equipment used: Rollator (4 wheels) Transfers: Sit to/from Stand Sit  to Stand: Supervision                Ambulation/Gait Ambulation/Gait assistance: Supervision Gait Distance (Feet): 20 Feet Assistive device: Rollator (4 wheels) Gait Pattern/deviations: Antalgic, Decreased stance time - left, Step-through pattern, Decreased stride length Gait velocity: decreased     General Gait Details: no LOB noted.   Stairs             Wheelchair Mobility    Modified Rankin (Stroke Patients Only)       Balance Overall balance assessment: Mild deficits observed, not formally tested                                          Cognition Arousal/Alertness: Awake/alert Behavior During Therapy: WFL for tasks assessed/performed Overall Cognitive Status: Within Functional Limits for tasks assessed                                          Exercises      General Comments General comments (skin integrity, edema, etc.): 96% on 2L.      Pertinent Vitals/Pain Pain Assessment Pain Assessment: No/denies pain    Home Living                          Prior Function            PT Goals (current goals can now be found in the care plan section) Progress towards PT goals: Goals met/education completed, patient discharged from PT  Frequency    Min 1X/week      PT Plan Other (comment) (DCPT)    Co-evaluation              AM-PAC PT "6 Clicks" Mobility   Outcome Measure  Help needed turning from your back to your side while in a flat bed without using bedrails?: None Help needed moving from lying on your back to sitting on the side of a flat bed without using bedrails?: None Help needed moving to and from a bed to a chair (including a wheelchair)?: A Little Help needed standing up from a chair using your arms (e.g., wheelchair or bedside chair)?: A Little Help needed to walk in hospital room?: A Little Help needed climbing 3-5 steps with a railing? : A Lot 6 Click Score: 19    End of  Session Equipment Utilized During Treatment: Gait belt;Oxygen Activity Tolerance: Patient tolerated treatment well Patient left: in bed;with call bell/phone within reach Nurse Communication: Mobility status PT Visit Diagnosis: Other abnormalities of gait and mobility (R26.89)     Time: 1610-9604 PT Time Calculation (min) (ACUTE ONLY): 20 min  Charges:  $Gait Training: 8-22 mins                     Shela Nevin, PT, DPT Acute Rehab Services [phone]    [name] 10/30/2022, 9:19 AM

## 2022-10-30 NOTE — TOC Transition Note (Addendum)
Transition of Care [facility]) - CM/SW Discharge Note   Patient Details  Name: Dana Morrison MRN: [id number] Date of Birth: [birth year]  [facility] CM/SW Contact:  Gala Lewandowsky, RN Phone Number: 10/30/2022, 10:45 AM   Clinical Narrative: Plan will be for transition home today. Son will transport patient home; however, it will be later after he gets off work. Patient is active with [facility] for PT services. Patient is active with [employer] for 1 liter oxygen nocturnal. MD is aware that if the patient needs oxygen continuously we will need an ambulatory sat completed. Case Manager will continue to follow for transition of care needs.     [phone] 10-30-22 Patient qualified for oxygen via ambulatory sat. Oxygen to be delivered to the unit. No further needs identified.   Final next level of care: Home w Home Health Services Barriers to Discharge: No Barriers Identified   Patient Goals and CMS Choice   Choice offered to / list presented to : Adult Children  Discharge Plan and Services Additional resources added to the After Visit Summary for   In-house Referral: NA Discharge Planning Services: CM Consult Post Acute Care Choice: Home Health            DME Agency: NA       HH Arranged: PT, OT HH Agency: [facility]) Date [facility] Agency Contacted: 10/30/22 Time HH Agency Contacted: [phone] Representative spoke with at [facility] Agency: Eber Jones.  Social Determinants of Health (SDOH) Interventions SDOH Screenings   Food Insecurity: No Food Insecurity (08/22/2022)  Housing: Low Risk  (08/22/2022)  Transportation Needs: Unknown (08/22/2022)  Utilities: Not At Risk (08/22/2022)  Tobacco Use: Medium Risk (10/27/2022)   Readmission Risk Interventions     No data to display

## 2022-10-30 NOTE — Discharge Summary (Addendum)
Physician Discharge Summary   Patient: Dana Morrison MRN: [id number] DOB: [birth year]  Admit date:     10/27/2022  Discharge date: 10/30/22  Discharge Physician: York Ram Shavonn Convey   PCP: Charlane Ferretti, DO   Recommendations at discharge:    Patient will continue with bumetanide daily and plan to increase to bid in case of volume overload, 2 to 3 lbs weight increase in 24 hrs or 5 lbs in 7 days.  Added SGLT 2 ing and spironolactone. Follow up renal function and electrolytes in 7 days as outpatient.  Follow up with Dr Thornell Mule in 7 to 10 days. Follow up with Cardiology as scheduled.    I spoke with patient's son over the phone we talked in detail about patient's condition, plan of care and prognosis and all questions were addressed.   Discharge Diagnoses: Principal Problem:   Acute on chronic diastolic heart failure Active Problems:   Paroxysmal atrial fibrillation   Essential hypertension   Hypokalemia   Hyperlipidemia   Anemia of chronic disease  Resolved Problems:   * No resolved hospital problems. [facility] Course: Dana Morrison was admitted to the hospital with the working diagnosis of decompensated heart failure.   89 yo female with the past medical history of heart failure, paroxysmal atrial fibrillation, sick sinus syndrome sp Pacemaker, hypertension and dyslipidemia who presented with dyspnea. Reported on day of progressive dyspnea, associated with orthopnea and lower extremity edema.  Recent hospitalization 08/2022 for right hip fracture, and discharged to SNF, apparently her bumetanide was initially held at the nursing facility.  Hospitalization on 03/204 for GI bleeding, and apixaban was held. At home she developed worsening edema and her diuretic dose was increased per instructions of her primary care provider with no much improvement in her symptoms.   Because of her severe symptoms EMS was called. She was found in respiratory distress, her 02 saturation was in  the 50s, she was placed on Cpap and transported to the ED, where she was transitioned to Bipap, her blood pressure was 104/60 and 95/58, HR 59, RR 21 and 02 saturation 97%, lungs with no wheezing or rales, heart with S1 and S2 present and regular, abdomen with no distention and positive lower extremity edema.   Na 136, K 3,1 Cl 100 bicarbonate 23, glucose 200 bun 15 cr 1,1 BNP 386  Wbc 8,2 hgb 10,8 plt 220   Chest radiograph with cardiomegaly, bilateral hilar vascular congestion, with bilateral interstitial infiltrates, symmetric and more predominant upper lobes, cephalization of the vasculature. No effusions.  Pacemaker in place with one atrial and one right ventricular lead.   EKG 87 bpm, left axis, left bundle branch block, atrial sensing and ventricular pacing, qtc 571, with no significant ST segment or T wave changes.   02/22 patient is tolerating well diuresis with improvement in volume status. Echocardiogram with reduction in LV systolic function.   02/23 patient with improvement in her volume status.  Plan to follow up as outpatient.   Assessment and Plan: * Acute on chronic diastolic heart failure Echocardiogram with reduced LV systolic function 35 to 40%, global hypokinesis, no LVH, RV mild mild enlargement, RVSP 53,4 mmHg. No significant valvular disease.  Paradoxical septal movement due to RV pacing   Patient was placed on IV furosemide for diuresis, negative fluid balance was achieved, -6,100 ml, with significant improvement in her symptoms.   Continue with empagliflozin and spironolactone. Continue metoprolol succinate and isosorbide.  Continue diuresis with bumetanide.  Holding on ARB to prevent  hypotension.   Paroxysmal atrial fibrillation -Continue rate control with metoprolol and dronedarone. Apixaban currently on hold due to significant episode of gastrointestinal bleeding.  Patient will follow up with GI as outpatient.   Head CT with no acute changes.  Regressed  left anterior scalp hematoma.   Essential hypertension Blood pressure stable, continue diuresis and metoprolol. Hold on ARB for now.   Hypokalemia Patient tolerated well diuresis, at the time of her discharge her serum cr is 1,0 with K at 3,7 and serum  bicarbonate at 31.  Plan to continue diuresis with bumetanide, spironolactone and SGLT 2 inh.  Follow up renal function as outpatient in 7 to 10 days.   Hyperlipidemia -Continue statin -Heart healthy diet discussed with patient.  Anemia of chronic disease -Hgb has been stable. She had a recent event of GI bleed Follow up with GI as outpatient.   Chronic iron deficiency anemia, continue with oral iron supplementation. Follow up as outpatient.           Consultants: none  Procedures performed: none   Disposition: Home Diet recommendation:  Discharge Diet Orders (From admission, onward)     Start     Ordered   10/30/22 0000  Diet - low sodium heart healthy        10/30/22 1002           Cardiac diet DISCHARGE MEDICATION: Allergies as of 10/30/2022       Reactions   Psyllium Diarrhea   Other    Per patient she has an intolerance to some legumes. She does not have a true allergy. Pt's son confirmed this as well.         Medication List     STOP taking these medications    apixaban 5 MG Tabs tablet Commonly known as: Eliquis   doxycycline 100 MG tablet Commonly known as: VIBRA-TABS   senna-docusate 8.6-50 MG tablet Commonly known as: Senokot-S       TAKE these medications    acetaminophen 325 MG tablet Commonly known as: TYLENOL Take 2 tablets (650 mg total) by mouth every 8 (eight) hours as needed for moderate pain or mild pain.   aspirin 81 MG chewable tablet Chew 1 tablet by mouth daily.   bumetanide 1 MG tablet Commonly known as: BUMEX Take 1 tablet (1 mg total) by mouth daily. Please take twice daily in case of weight gain 2 to 3 lbs in 24 hrs or 5 lbs in 7 days. What changed:  additional instructions   calcium-vitamin D 500-5 MG-MCG tablet Commonly known as: OSCAL WITH D Take 1 tablet by mouth daily with breakfast.   empagliflozin 10 MG Tabs tablet Commonly known as: JARDIANCE Take 1 tablet (10 mg total) by mouth daily.   ferrous sulfate 325 (65 FE) MG tablet Take 325 mg by mouth daily with breakfast.   fluticasone-salmeterol 100-50 MCG/ACT Aepb Commonly known as: Wixela Inhub Inhale 1 puff into the lungs 2 (two) times daily.   guaiFENesin-dextromethorphan 100-10 MG/5ML syrup Commonly known as: ROBITUSSIN DM Take 10 mLs by mouth every 6 (six) hours as needed for cough.   isosorbide mononitrate 30 MG 24 hr tablet Commonly known as: IMDUR TAKE 1 TABLET EVERY DAY   metoprolol succinate 25 MG 24 hr tablet Commonly known as: TOPROL-XL TAKE 1 TABLET EVERY DAY   Multaq 400 MG tablet Generic drug: dronedarone TAKE 1 TABLET TWICE DAILY WITH MEALS   multivitamin capsule Take 1 capsule by mouth daily.   nitroGLYCERIN 0.4 MG  SL tablet Commonly known as: NITROSTAT Place 0.4 mg under the tongue every 5 (five) minutes as needed for chest pain.   pantoprazole 40 MG tablet Commonly known as: PROTONIX Take 1 tablet (40 mg total) by mouth daily.   pravastatin 20 MG tablet Commonly known as: PRAVACHOL TAKE 1 TABLET EVERY DAY (NEED MD APPOINTMENT) What changed: See the new instructions.   spironolactone 25 MG tablet Commonly known as: ALDACTONE Take 0.5 tablets (12.5 mg total) by mouth daily.        Discharge Exam: Filed Weights   10/29/22 0343 10/30/22 0447  Weight: 79.2 kg 79.1 kg   BP 122/70 (BP Location: Left Arm)   Pulse 78   Temp 98.5 F (36.9 C) (Oral)   Resp 16   Ht 5\' 6"  (1.676 m)   Wt 79.1 kg   SpO2 95%   BMI 28.15 kg/m    Patient is feeling better, her dyspnea and edema have improved, she is on room air, seating at the chair.   Neurology awake and alert ENT with mild pallor  Cardiovascular with S1 and S2 present and  regular with no gallops, or rubs No JVD No lower extremity edema Respiratory with no rales or wheezing Abdomen with no distention   Condition at discharge: stable  The results of significant diagnostics from this hospitalization (including imaging, microbiology, ancillary and laboratory) are listed below for reference.   Imaging Studies: CT HEAD WO CONTRAST ( )  Result Date: 10/29/2022 CLINICAL DATA:  89 year old female TIA. Decompensated heart failure. Paroxysmal atrial fibrillation. EXAM: CT HEAD WITHOUT CONTRAST TECHNIQUE: Contiguous axial images were obtained from the base of the skull through the vertex without intravenous contrast. RADIATION DOSE REDUCTION: This exam was performed according to the departmental dose-optimization program which includes automated exposure control, adjustment of the mA and/or kV according to patient size and/or use of iterative reconstruction technique. COMPARISON:  Head CT 09/21/2022 [facility] FINDINGS: Brain: Cerebral volume is within normal limits for age. No midline shift, mass effect, or evidence of intracranial mass lesion. No ventriculomegaly. No acute intracranial hemorrhage identified. Small chronic right cerebellar infarct is stable. Patchy and confluent bilateral white matter hypodensity appears stable. No cortically based acute infarct identified. Vascular: No suspicious intracranial vascular hyperdensity. Calcified atherosclerosis at the skull base. Skull: No acute osseous abnormality identified. Sinuses/Orbits: Trace sphenoid sinus low-density fluid is stable. Mild right mastoid effusion is stable. Other paranasal sinuses and mastoids are well aerated. Other: Regressed left anterior scalp hematoma since last month, not fully resolved. Orbits soft tissues appears stable and negative. IMPRESSION: 1. No acute intracranial abnormality. Stable non contrast CT appearance of chronic small vessel disease since last  month. 2. Regressed left anterior scalp hematoma. Electronically Signed   By: Odessa Fleming M.D.   On: 10/29/2022 08:48   ECHOCARDIOGRAM COMPLETE  Result Date: 10/28/2022    ECHOCARDIOGRAM REPORT   Patient Name:   ORVA RILES Date of Exam: 10/28/2022 Medical Rec #:  [id number]      Height:       66.0 in Accession #:    [id number]     Weight:       192.9 lb Date of Birth:  June 07, 1924       BSA:          1.969 m Patient Age:    89 years       BP:           108/52 mmHg Patient Gender: F  HR:           86 bpm. Exam Location:  Inpatient Procedure: 2D Echo, Cardiac Doppler, Color Doppler and Intracardiac            Opacification Agent Indications:    CHF-Acute Diastolic I50.31  History:        Patient has prior history of Echocardiogram examinations, most                 recent 09/20/2017. CHF, CAD, Pacemaker; Risk                 Factors:Dyslipidemia, Hypertension and Former Smoker.  Sonographer:    Dondra Prader RVT RCS Referring Phys: [id number] Angie Fava  Sonographer Comments: Technically difficult study due to poor echo windows, suboptimal parasternal window and suboptimal apical window. IMPRESSIONS  1. Left ventricular ejection fraction, by estimation, is 35 to 40%. The left ventricle has moderately decreased function. The left ventricle demonstrates global hypokinesis. Left ventricular diastolic parameters are consistent with Grade I diastolic dysfunction (impaired relaxation).  2. Right ventricular systolic function is normal. The right ventricular size is mildly enlarged. There is moderately elevated pulmonary artery systolic pressure. The estimated right ventricular systolic pressure is 53.4 mmHg.  3. Right atrial size was mildly dilated.  4. The mitral valve is grossly normal. Mild mitral valve regurgitation. No evidence of mitral stenosis.  5. The aortic valve was not well visualized. Aortic valve regurgitation is not visualized. No aortic stenosis is present.  6. The inferior vena cava is dilated  in size with <50% respiratory variability, suggesting right atrial pressure of 15 mmHg. FINDINGS  Left Ventricle: Left ventricular ejection fraction, by estimation, is 35 to 40%. The left ventricle has moderately decreased function. The left ventricle demonstrates global hypokinesis. Definity contrast agent was given IV to delineate the left ventricular endocardial borders. The left ventricular internal cavity size was normal in size. There is no left ventricular hypertrophy. Abnormal (paradoxical) septal motion, consistent with RV pacemaker. Left ventricular diastolic parameters are consistent with Grade I diastolic dysfunction (impaired relaxation). Right Ventricle: The right ventricular size is mildly enlarged. No increase in right ventricular wall thickness. Right ventricular systolic function is normal. There is moderately elevated pulmonary artery systolic pressure. The tricuspid regurgitant velocity is 3.10 m/s, and with an assumed right atrial pressure of 15 mmHg, the estimated right ventricular systolic pressure is 53.4 mmHg. Left Atrium: Left atrial size was normal in size. Right Atrium: Right atrial size was mildly dilated. Pericardium: There is no evidence of pericardial effusion. Mitral Valve: The mitral valve is grossly normal. Mild mitral valve regurgitation. No evidence of mitral valve stenosis. Tricuspid Valve: The tricuspid valve is normal in structure. Tricuspid valve regurgitation is mild . No evidence of tricuspid stenosis. Aortic Valve: The aortic valve was not well visualized. Aortic valve regurgitation is not visualized. No aortic stenosis is present. Aortic valve mean gradient measures 3.0 mmHg. Aortic valve peak gradient measures 5.5 mmHg. Aortic valve area, by VTI measures 3.04 cm. Pulmonic Valve: The pulmonic valve was not well visualized. Pulmonic valve regurgitation is trivial. No evidence of pulmonic stenosis. Aorta: The aortic root is normal in size and structure. Venous: The  inferior vena cava is dilated in size with less than 50% respiratory variability, suggesting right atrial pressure of 15 mmHg. IAS/Shunts: No atrial level shunt detected by color flow Doppler. Additional Comments: A device lead is visualized in the right atrium and right ventricle.  LEFT VENTRICLE PLAX 2D LVIDd:  4.80 cm   Diastology LVIDs:         3.50 cm   LV e' medial:    6.60 cm/s LV PW:         0.90 cm   LV E/e' medial:  16.4 LV IVS:        0.80 cm   LV e' lateral:   6.15 cm/s LVOT diam:     2.00 cm   LV E/e' lateral: 17.6 LV SV:         67 LV SV Index:   34 LVOT Area:     3.14 cm  RIGHT VENTRICLE             IVC RV Basal diam:  3.90 cm     IVC diam: 2.20 cm RV Mid diam:    3.20 cm RV S prime:     14.60 cm/s TAPSE (M-mode): 2.5 cm LEFT ATRIUM             Index        RIGHT ATRIUM           Index LA diam:        3.40 cm 1.73 cm/m   RA Area:     19.90 cm LA Vol (A2C):   68.2 ml 34.63 ml/m  RA Volume:   65.40 ml  33.21 ml/m LA Vol (A4C):   45.7 ml 23.21 ml/m LA Biplane Vol: 55.2 ml 28.03 ml/m  AORTIC VALVE                    PULMONIC VALVE AV Area (Vmax):    2.82 cm     PV Vmax:          1.02 m/s AV Area (Vmean):   3.12 cm     PV Peak grad:     4.2 mmHg AV Area (VTI):     3.04 cm     PR End Diast Vel: 4.16 msec AV Vmax:           117.00 cm/s AV Vmean:          73.400 cm/s AV VTI:            0.221 m AV Peak Grad:      5.5 mmHg AV Mean Grad:      3.0 mmHg LVOT Vmax:         105.00 cm/s LVOT Vmean:        72.800 cm/s LVOT VTI:          0.214 m LVOT/AV VTI ratio: 0.97  AORTA Ao Root diam: 3.00 cm Ao Asc diam:  3.60 cm Ao Arch diam: 2.9 cm MITRAL VALVE                TRICUSPID VALVE MV Area (PHT): 2.55 cm     TR Peak grad:   38.4 mmHg MV Decel Time: 298 msec     TR Vmax:        310.00 cm/s MR Peak grad: 72.6 mmHg MR Mean grad: 46.0 mmHg     SHUNTS MR Vmax:      426.00 cm/s   Systemic VTI:  0.21 m MR Vmean:     314.0 cm/s    Systemic Diam: 2.00 cm MV E velocity: 108.00 cm/s MV A velocity: 128.00 cm/s  MV E/A ratio:  0.84 Weston Brass MD Electronically signed by Weston Brass MD Signature Date/Time: 10/28/2022/5:15:31 PM    Final    DG Chest Portable 1 View  Result  Date: 10/27/2022 CLINICAL DATA:  Dyspnea EXAM: PORTABLE CHEST 1 VIEW COMPARISON:  08/27/2022 FINDINGS: The lungs are symmetrically well expanded. Superimposed extensive perihilar airspace infiltrate has developed in keeping with alveolar pulmonary edema or diffuse pneumonic infiltrate. No pneumothorax or pleural effusion. Cardiac size is within normal limits. Left subclavian dual lead pacemaker is unchanged. No acute bone abnormality. IMPRESSION: 1. Interval development of extensive perihilar airspace infiltrate in keeping with alveolar pulmonary edema or diffuse pneumonic infiltrate. Electronically Signed   By: Helyn Numbers M.D.   On: 10/27/2022 02:57   CUP PACEART REMOTE DEVICE CHECK  Result Date: 10/12/2022 Scheduled remote reviewed. Normal device function.  Hx of PAF, longest duration 7hrs , controlled rates Burden <1%, Eliquis per prior report Next remote 91 days. LA, CVRS   Microbiology: Results for orders placed or performed during the hospital encounter of 08/21/22  Surgical pcr screen     Status: None   Collection Time: 08/23/22  7:37 AM   Specimen: Nasal Mucosa; Nasal Swab  Result Value Ref Range Status   MRSA, PCR NEGATIVE NEGATIVE Final   Staphylococcus aureus NEGATIVE NEGATIVE Final    Comment: (NOTE) The Xpert SA Assay (FDA approved for NASAL specimens in patients 42 years of age and older), is one component of a comprehensive surveillance program. It is not intended to diagnose infection nor to guide or monitor treatment. Performed at [facility], [address], [place], [place] [address]     Labs: CBC: Recent Labs  Lab 10/27/22 0233 10/27/22 0617  WBC 8.2  --   NEUTROABS 3.9  --   HGB 10.8* 9.9*  HCT 34.8* 29.0*  MCV 84.1  --   PLT 220  --    Basic Metabolic Panel: Recent Labs   Lab 10/27/22 0233 10/27/22 0617 10/28/22 0226 10/29/22 0300 10/30/22 0301  NA 136 139 140 142 140  K 3.1* 3.9 3.6 3.1* 3.7  CL 100  --  101 98 99  CO2 23  --  29 35* 31  GLUCOSE 200*  --  98 109* 103*  BUN 15  --  18 20 20   CREATININE 1.10*  --  1.10* 0.93 1.04*  CALCIUM 8.2*  --  7.8* 7.9* 8.4*  MG  --   --  2.0 2.2  --   PHOS  --   --   --  4.4  --    Liver Function Tests: Recent Labs  Lab 10/27/22 0233  AST 33  ALT 15  ALKPHOS 117  BILITOT 0.6  PROT 6.8  ALBUMIN 3.1*   CBG: No results for input(s): "GLUCAP" in the last 168 hours.  Discharge time spent: greater than 30 minutes.  Signed: Coralie Keens, MD [facility] 10/30/2022

## 2022-10-30 NOTE — Progress Notes (Signed)
SATURATION QUALIFICATIONS: (This note is used to comply with regulatory documentation for home oxygen)  Patient Saturations on Room Air at Rest = 92%  Patient Saturations on Room Air while Ambulating = 86%  Patient Saturations on 2 Liters of oxygen while Ambulating = 90%  Please briefly explain why patient needs home oxygen: Patient dropped below 88% during ambulation.

## 2022-10-30 NOTE — Care Management (Signed)
    Durable Medical Equipment  (From admission, onward)           Start     Ordered   10/30/22 1210  For home use only DME oxygen  Once       Question Answer Comment  Length of Need 12 Months   Mode or (Route) Nasal cannula   Liters per Minute 2   Frequency Continuous (stationary and portable oxygen unit needed)   Oxygen conserving device Yes   Oxygen delivery system Gas      10/30/22 1209

## 2022-11-01 DIAGNOSIS — I2510 Atherosclerotic heart disease of native coronary artery without angina pectoris: Secondary | ICD-10-CM | POA: Diagnosis not present

## 2022-11-01 DIAGNOSIS — M1990 Unspecified osteoarthritis, unspecified site: Secondary | ICD-10-CM | POA: Diagnosis not present

## 2022-11-01 DIAGNOSIS — K219 Gastro-esophageal reflux disease without esophagitis: Secondary | ICD-10-CM | POA: Diagnosis not present

## 2022-11-01 DIAGNOSIS — I252 Old myocardial infarction: Secondary | ICD-10-CM | POA: Diagnosis not present

## 2022-11-01 DIAGNOSIS — M80051D Age-related osteoporosis with current pathological fracture, right femur, subsequent encounter for fracture with routine healing: Secondary | ICD-10-CM | POA: Diagnosis not present

## 2022-11-01 DIAGNOSIS — I480 Paroxysmal atrial fibrillation: Secondary | ICD-10-CM | POA: Diagnosis not present

## 2022-11-01 DIAGNOSIS — I110 Hypertensive heart disease with heart failure: Secondary | ICD-10-CM | POA: Diagnosis not present

## 2022-11-01 DIAGNOSIS — I5032 Chronic diastolic (congestive) heart failure: Secondary | ICD-10-CM | POA: Diagnosis not present

## 2022-11-01 DIAGNOSIS — E785 Hyperlipidemia, unspecified: Secondary | ICD-10-CM | POA: Diagnosis not present

## 2022-11-02 ENCOUNTER — Encounter (HOSPITAL_BASED_OUTPATIENT_CLINIC_OR_DEPARTMENT_OTHER): Payer: Self-pay | Admitting: Pulmonary Disease

## 2022-11-02 ENCOUNTER — Ambulatory Visit (INDEPENDENT_AMBULATORY_CARE_PROVIDER_SITE_OTHER): Payer: Medicare HMO | Admitting: Pulmonary Disease

## 2022-11-02 VITALS — BP 108/60 | HR 77 | Temp 97.9°F | Ht 67.0 in | Wt 168.0 lb

## 2022-11-02 DIAGNOSIS — J42 Unspecified chronic bronchitis: Secondary | ICD-10-CM

## 2022-11-02 DIAGNOSIS — G4733 Obstructive sleep apnea (adult) (pediatric): Secondary | ICD-10-CM | POA: Diagnosis not present

## 2022-11-02 DIAGNOSIS — G4734 Idiopathic sleep related nonobstructive alveolar hypoventilation: Secondary | ICD-10-CM | POA: Diagnosis not present

## 2022-11-02 NOTE — Progress Notes (Signed)
Subjective:   PATIENT ID: Dana Morrison GENDER: female DOB: 1923/11/24, MRN: [id number]  Chief Complaint  Patient presents with   Follow-up    Follow up. Patients son stated that patient has been very weak since the last hospital visit.     Reason for Visit: Follow-up shortness of breath  [name] is a 89 year old female with CAD s/p stent 2016, pAF, s/p PPM, HLD, esophageal dysmotility who presents for shortness of breath.   Initial consult She has a long standing history of shortness of breath for >10 years since her cardiac arrest in 2010. Followed by cardiology, on bumex for lower extremity edema. Referred for wheezing and pulmonary evaluation.  Two years ago she had covid but has had worsening respiratory symptoms in the last 6 months with shortness of breath, chest congestion and nonproductive cough. Worsens with walking, bathing or any upper body movement. Denies wheezing. Denies nocturnal symptoms. She had previously seen a Pulmonologist in [place] a few decades ago and reported needing oxygen at night which did not help.  Present with son who provides additional history.  08/03/22 Daughter-in-law present and helps with additional history. She had tried mucinex and did not receive flutter valve. She continues to have shortness of breath, chest congestion and nonproductive cough. She reports snoring at home. No one available to witness sleep apnea  11/02/22 Son present and provides additional history. Since our last visit she had right hip fracture in Feb and discharged to SNF. She was recently admitted from 4/20-4/23/24 for acute on chronic diastolic heart failure. Coughing has improved. Using [place] regularly now. Has not started flutter valve. Still have chest congestion and shortness of breath. Feeling weak and working with PT. Reported that she may need O2 with activity which has been ordered by another provider.  Social History: Negligible smoking history. Quit in  1995  Past Medical History:  Diagnosis Date   Atrial fibrillation (HCC)    Chronic diastolic heart failure (HCC) 09/12/2017   Congestive heart failure (CHF) (HCC)    estimated ejection fraction is 54%; diastolic   Coronary artery disease    Decreased vision 09/12/2017   Hx of myocardial infarction 2010   Hypertension    Osteoarthritis of hip 09/12/2017   Osteoarthritis of knee 09/12/2017   Paroxysmal atrial fibrillation (HCC) 09/12/2017   Scalp psoriasis 09/12/2017   Seasonal allergic rhinitis due to pollen 09/12/2017   Urticaria 09/12/2017   Venous insufficiency 09/12/2017     Family History  Problem Relation Age of Onset   Microcephaly Mother    Heart attack Mother    Heart attack Father    Lung cancer Brother    Other Brother        BACK PROBLEMS   Healthy Daughter    Healthy Daughter    Healthy Daughter    Healthy Son      Social History   Occupational History   Not on file  Tobacco Use   Smoking status: Former    Packs/day: 0.25    Years: 15.00    Additional pack years: 0.00    Total pack years: 3.75    Types: Cigarettes    Quit date: 1997    Years since quitting: 27.3   Smokeless tobacco: Never   Tobacco comments:    Started smoking at 69 when husband passed stopped smoking age 38  Vaping Use   Vaping Use: Never used  Substance and Sexual Activity   Alcohol use: Yes  Comment: RARE   Drug use: No   Sexual activity: Not Currently    Allergies  Allergen Reactions   Psyllium Diarrhea   Other     Per patient she has an intolerance to some legumes. She does not have a true allergy. Pt's son confirmed this as well.      Outpatient Medications Prior to Visit  Medication Sig Dispense Refill   acetaminophen (TYLENOL) 325 MG tablet Take 2 tablets (650 mg total) by mouth every 8 (eight) hours as needed for moderate pain or mild pain.     aspirin 81 MG chewable tablet Chew 1 tablet by mouth daily.     bumetanide (BUMEX) 1 MG tablet Take 1 tablet (1 mg total) by mouth  daily. Please take twice daily in case of weight gain 2 to 3 lbs in 24 hrs or 5 lbs in 7 days. 90 tablet 1   calcium-vitamin D (OSCAL WITH D) 500-5 MG-MCG tablet Take 1 tablet by mouth daily with breakfast.     empagliflozin (JARDIANCE) 10 MG TABS tablet Take 1 tablet (10 mg total) by mouth daily. 30 tablet 0   ferrous sulfate 325 (65 FE) MG tablet Take 325 mg by mouth daily with breakfast.     fluticasone-salmeterol (WIXELA INHUB) 100-50 MCG/ACT AEPB Inhale 1 puff into the lungs 2 (two) times daily. 60 each 5   isosorbide mononitrate (IMDUR) 30 MG 24 hr tablet TAKE 1 TABLET EVERY DAY (Patient taking differently: Take 30 mg by mouth daily.) 90 tablet 2   metoprolol succinate (TOPROL-XL) 25 MG 24 hr tablet TAKE 1 TABLET EVERY DAY (Patient taking differently: Take 25 mg by mouth daily.) 90 tablet 2   MULTAQ 400 MG tablet TAKE 1 TABLET TWICE DAILY WITH MEALS 180 tablet 3   Multiple Vitamin (MULTIVITAMIN) capsule Take 1 capsule by mouth daily.     nitroGLYCERIN (NITROSTAT) 0.4 MG SL tablet Place 0.4 mg under the tongue every 5 (five) minutes as needed for chest pain.     pantoprazole (PROTONIX) 40 MG tablet Take 1 tablet (40 mg total) by mouth daily.     pravastatin (PRAVACHOL) 20 MG tablet TAKE 1 TABLET EVERY DAY (NEED MD APPOINTMENT) (Patient taking differently: Take 20 mg by mouth daily.) 90 tablet 1   spironolactone (ALDACTONE) 25 MG tablet Take 1/2 tablet (12.5 mg total) by mouth daily. 15 tablet 0   guaiFENesin-dextromethorphan (ROBITUSSIN DM) 100-10 MG/5ML syrup Take 10 mLs by mouth every 6 (six) hours as needed for cough. (Patient not taking: Reported on 10/27/2022) 118 mL 0   Facility-Administered Medications Prior to Visit  Medication Dose Route Frequency Provider Last Rate Last Admin   bupivacaine-epinephrine (PF) (MARCAINE W/ EPI) 0.5% -1:200000 injection   Peri-NEURAL Anesthesia Intra-op Beryle Lathe, MD   25 mL at 08/22/22 1256    Review of Systems  Constitutional:  Positive for  malaise/fatigue. Negative for chills, diaphoresis, fever and weight loss.  HENT:  Positive for congestion.   Respiratory:  Positive for cough, sputum production and shortness of breath. Negative for hemoptysis and wheezing.   Cardiovascular:  Negative for chest pain, palpitations and leg swelling.     Objective:   Vitals:   11/02/22 0848  BP: 108/60  Pulse: 77  Temp: 97.9 F (36.6 C)  TempSrc: Oral  SpO2: 93%  Weight: 168 lb (76.2 kg)  Height: 5\' 7"  (1.702 m)   SpO2: 93 % O2 Device: None (Room air)  Physical Exam: General: Frail, chronically ill-appearing, no acute distress HENT:  [place], AT Eyes: EOMI, no scleral icterus Respiratory: Diminished but clear to auscultation bilaterally.  No crackles, wheezing or rales Cardiovascular: RRR, -M/R/G, no JVD Extremities:-Edema,-tenderness Neuro: AAO x4, CNII-XII grossly intact Psych: Normal mood, normal affect   Data Reviewed:  Imaging: CXR reportedly normal per PCP phone conversation CT Chest 07/27/22 - Mild basilar GGO with scattered subpleural reticulation  PFT: 08/03/22 FVC 1.27 (64%) FEV1 1.26 (88%) Ratio 100  TLC 215% RV 341% RV/TLC 154%  Interpretation: Normal spirometry. Hyperinflation and air trapping present however unclear significance if the absence of obstructive defect.  Labs: CBC    Component Value Date/Time   WBC 8.2 10/27/2022 0233   RBC 4.14 10/27/2022 0233   HGB 9.9 (L) 10/27/2022 0617   HGB 13.6 08/06/2019 1052   HCT 29.0 (L) 10/27/2022 0617   HCT 40.9 08/06/2019 1052   PLT 220 10/27/2022 0233   PLT 189 08/06/2019 1052   MCV 84.1 10/27/2022 0233   MCV 84 08/06/2019 1052   MCH 26.1 10/27/2022 0233   MCHC 31.0 10/27/2022 0233   RDW 15.9 (H) 10/27/2022 0233   RDW 14.4 08/06/2019 1052   LYMPHSABS 3.2 10/27/2022 0233   LYMPHSABS 1.2 02/27/2018 1024   MONOABS 0.8 10/27/2022 0233   EOSABS 0.3 10/27/2022 0233   EOSABS 0.1 02/27/2018 1024   BASOSABS 0.1 10/27/2022 0233   BASOSABS 0.0 02/27/2018 1024    Absolute eos 02/27/18 -100  Sleep: ONO 07/19/22 SpO2<88% 2 hr 3 min 56 sec Nadir SpO2 79% Average 90% Interpretation: Qualifies for home oxygen    Assessment & Plan:   Discussion: 89 year old female with CAD s/p stent 2016, pAF, s/p PPM, HLD, esophageal dysmotility who presents for shortness of breath. Shortness of breath likely multifactorial including fibrosis with hypoxemia, possible underlying obstructive defect and deconditioning. Deconditioning likely exacerbated with recent hospitalization. Counseled on oxygen use and resuming Wixela and flutter valve for trial since she has not consistently taken this yet   Shortness of breath Chronic cough --CT with air trapping, minimal scarring --PFTs with air trapping --RESTART Wixela 100-50 mcg ONE puff in the morning and evening. Rinse out mouth after use to prevent thrush --START flutter valve to use 3x a day --Agree with GI evaluation  Nocturnal Hypoxemia --CONTINUE 1L O2 nightly  Health Maintenance Immunization History  Administered Date(s) Administered   Influenza, High Dose Seasonal PF 05/22/2019, 05/01/2022   Influenza,inj,Quad PF,6+ Mos 05/02/2009   PFIZER(Purple Top)SARS-COV-2 Vaccination 09/06/2019, 09/28/2019   Pneumococcal Conjugate-13 07/08/2015   CT Lung Screen - not qualified due to age, tobacco history  No orders of the defined types were placed in this encounter.  No orders of the defined types were placed in this encounter.   Return in about 3 months (around 02/01/2023).  I have spent a total time of 35-minutes on the day of the appointment including chart review, data review, collecting history, coordinating care and discussing medical diagnosis and plan with the patient/family. Past medical history, allergies, medications were reviewed. Pertinent imaging, labs and tests included in this note have been reviewed and interpreted independently by me.  Kemontae Dunklee Mechele Collin, MD [facility] 11/02/2022 2:33 PM  Office Number [phone]

## 2022-11-02 NOTE — Patient Instructions (Addendum)
Shortness of breath Chronic cough --CT with air trapping, minimal scarring --PFTs with air trapping --RESTART Wixela 100-50 mcg ONE puff in the morning and evening. Rinse out mouth after use to prevent thrush --START flutter valve to use 3x a day --Agree with GI evaluation  Nocturnal Hypoxemia --CONTINUE 1L O2 nightly

## 2022-11-05 DIAGNOSIS — M80051D Age-related osteoporosis with current pathological fracture, right femur, subsequent encounter for fracture with routine healing: Secondary | ICD-10-CM | POA: Diagnosis not present

## 2022-11-05 DIAGNOSIS — I480 Paroxysmal atrial fibrillation: Secondary | ICD-10-CM | POA: Diagnosis not present

## 2022-11-05 DIAGNOSIS — I110 Hypertensive heart disease with heart failure: Secondary | ICD-10-CM | POA: Diagnosis not present

## 2022-11-05 DIAGNOSIS — I5032 Chronic diastolic (congestive) heart failure: Secondary | ICD-10-CM | POA: Diagnosis not present

## 2022-11-05 DIAGNOSIS — M1990 Unspecified osteoarthritis, unspecified site: Secondary | ICD-10-CM | POA: Diagnosis not present

## 2022-11-05 DIAGNOSIS — I2510 Atherosclerotic heart disease of native coronary artery without angina pectoris: Secondary | ICD-10-CM | POA: Diagnosis not present

## 2022-11-05 DIAGNOSIS — I252 Old myocardial infarction: Secondary | ICD-10-CM | POA: Diagnosis not present

## 2022-11-05 DIAGNOSIS — E785 Hyperlipidemia, unspecified: Secondary | ICD-10-CM | POA: Diagnosis not present

## 2022-11-05 DIAGNOSIS — K219 Gastro-esophageal reflux disease without esophagitis: Secondary | ICD-10-CM | POA: Diagnosis not present

## 2022-11-06 ENCOUNTER — Encounter (HOSPITAL_COMMUNITY): Payer: Self-pay

## 2022-11-06 ENCOUNTER — Inpatient Hospital Stay (HOSPITAL_COMMUNITY): Payer: Medicare HMO | Admitting: Anesthesiology

## 2022-11-06 ENCOUNTER — Inpatient Hospital Stay (HOSPITAL_COMMUNITY): Payer: Medicare HMO

## 2022-11-06 ENCOUNTER — Inpatient Hospital Stay (HOSPITAL_COMMUNITY)
Admission: EM | Admit: 2022-11-06 | Discharge: 2022-11-13 | DRG: 481 | Disposition: A | Discharge status: Skilled Nursing Facility | Payer: Medicare HMO | Attending: Internal Medicine | Admitting: Internal Medicine

## 2022-11-06 ENCOUNTER — Encounter (HOSPITAL_COMMUNITY)
Admission: EM | Disposition: A | Discharge status: Skilled Nursing Facility | Payer: Self-pay | Source: Home / Self Care | Attending: Internal Medicine

## 2022-11-06 ENCOUNTER — Other Ambulatory Visit: Payer: Self-pay

## 2022-11-06 ENCOUNTER — Emergency Department (HOSPITAL_COMMUNITY): Payer: Medicare HMO

## 2022-11-06 DIAGNOSIS — J301 Allergic rhinitis due to pollen: Secondary | ICD-10-CM | POA: Diagnosis present

## 2022-11-06 DIAGNOSIS — S72401A Unspecified fracture of lower end of right femur, initial encounter for closed fracture: Secondary | ICD-10-CM | POA: Diagnosis not present

## 2022-11-06 DIAGNOSIS — M9701XA Periprosthetic fracture around internal prosthetic right hip joint, initial encounter: Secondary | ICD-10-CM | POA: Diagnosis not present

## 2022-11-06 DIAGNOSIS — S728X1A Other fracture of right femur, initial encounter for closed fracture: Principal | ICD-10-CM

## 2022-11-06 DIAGNOSIS — I7092 Chronic total occlusion of artery of the extremities: Secondary | ICD-10-CM | POA: Diagnosis not present

## 2022-11-06 DIAGNOSIS — K5900 Constipation, unspecified: Secondary | ICD-10-CM | POA: Diagnosis present

## 2022-11-06 DIAGNOSIS — N1831 Chronic kidney disease, stage 3a: Secondary | ICD-10-CM | POA: Diagnosis not present

## 2022-11-06 DIAGNOSIS — Z7982 Long term (current) use of aspirin: Secondary | ICD-10-CM

## 2022-11-06 DIAGNOSIS — Z7901 Long term (current) use of anticoagulants: Secondary | ICD-10-CM

## 2022-11-06 DIAGNOSIS — I509 Heart failure, unspecified: Secondary | ICD-10-CM

## 2022-11-06 DIAGNOSIS — N179 Acute kidney failure, unspecified: Secondary | ICD-10-CM | POA: Diagnosis not present

## 2022-11-06 DIAGNOSIS — Z472 Encounter for removal of internal fixation device: Secondary | ICD-10-CM | POA: Diagnosis not present

## 2022-11-06 DIAGNOSIS — I5042 Chronic combined systolic (congestive) and diastolic (congestive) heart failure: Secondary | ICD-10-CM | POA: Diagnosis not present

## 2022-11-06 DIAGNOSIS — I130 Hypertensive heart and chronic kidney disease with heart failure and stage 1 through stage 4 chronic kidney disease, or unspecified chronic kidney disease: Secondary | ICD-10-CM | POA: Diagnosis not present

## 2022-11-06 DIAGNOSIS — H547 Unspecified visual loss: Secondary | ICD-10-CM | POA: Diagnosis present

## 2022-11-06 DIAGNOSIS — D62 Acute posthemorrhagic anemia: Secondary | ICD-10-CM | POA: Diagnosis not present

## 2022-11-06 DIAGNOSIS — I110 Hypertensive heart disease with heart failure: Secondary | ICD-10-CM | POA: Diagnosis not present

## 2022-11-06 DIAGNOSIS — Z79899 Other long term (current) drug therapy: Secondary | ICD-10-CM | POA: Diagnosis not present

## 2022-11-06 DIAGNOSIS — I480 Paroxysmal atrial fibrillation: Secondary | ICD-10-CM | POA: Diagnosis not present

## 2022-11-06 DIAGNOSIS — S72301A Unspecified fracture of shaft of right femur, initial encounter for closed fracture: Secondary | ICD-10-CM | POA: Diagnosis not present

## 2022-11-06 DIAGNOSIS — I10 Essential (primary) hypertension: Secondary | ICD-10-CM | POA: Diagnosis present

## 2022-11-06 DIAGNOSIS — Z96642 Presence of left artificial hip joint: Secondary | ICD-10-CM | POA: Diagnosis present

## 2022-11-06 DIAGNOSIS — D72829 Elevated white blood cell count, unspecified: Secondary | ICD-10-CM | POA: Diagnosis present

## 2022-11-06 DIAGNOSIS — R0602 Shortness of breath: Secondary | ICD-10-CM | POA: Diagnosis not present

## 2022-11-06 DIAGNOSIS — Z7984 Long term (current) use of oral hypoglycemic drugs: Secondary | ICD-10-CM

## 2022-11-06 DIAGNOSIS — I2510 Atherosclerotic heart disease of native coronary artery without angina pectoris: Secondary | ICD-10-CM

## 2022-11-06 DIAGNOSIS — S7291XA Unspecified fracture of right femur, initial encounter for closed fracture: Secondary | ICD-10-CM | POA: Diagnosis not present

## 2022-11-06 DIAGNOSIS — E1152 Type 2 diabetes mellitus with diabetic peripheral angiopathy with gangrene: Secondary | ICD-10-CM | POA: Diagnosis not present

## 2022-11-06 DIAGNOSIS — Z955 Presence of coronary angioplasty implant and graft: Secondary | ICD-10-CM

## 2022-11-06 DIAGNOSIS — Z87891 Personal history of nicotine dependence: Secondary | ICD-10-CM

## 2022-11-06 DIAGNOSIS — R9431 Abnormal electrocardiogram [ECG] [EKG]: Secondary | ICD-10-CM | POA: Diagnosis not present

## 2022-11-06 DIAGNOSIS — W06XXXA Fall from bed, initial encounter: Secondary | ICD-10-CM | POA: Diagnosis present

## 2022-11-06 DIAGNOSIS — Z66 Do not resuscitate: Secondary | ICD-10-CM | POA: Diagnosis present

## 2022-11-06 DIAGNOSIS — S72001D Fracture of unspecified part of neck of right femur, subsequent encounter for closed fracture with routine healing: Secondary | ICD-10-CM | POA: Diagnosis not present

## 2022-11-06 DIAGNOSIS — S72301D Unspecified fracture of shaft of right femur, subsequent encounter for closed fracture with routine healing: Secondary | ICD-10-CM

## 2022-11-06 DIAGNOSIS — S72009A Fracture of unspecified part of neck of unspecified femur, initial encounter for closed fracture: Secondary | ICD-10-CM | POA: Diagnosis not present

## 2022-11-06 DIAGNOSIS — W19XXXA Unspecified fall, initial encounter: Secondary | ICD-10-CM | POA: Diagnosis not present

## 2022-11-06 DIAGNOSIS — S7221XA Displaced subtrochanteric fracture of right femur, initial encounter for closed fracture: Secondary | ICD-10-CM | POA: Diagnosis not present

## 2022-11-06 DIAGNOSIS — N183 Chronic kidney disease, stage 3 unspecified: Secondary | ICD-10-CM | POA: Diagnosis not present

## 2022-11-06 DIAGNOSIS — S32511A Fracture of superior rim of right pubis, initial encounter for closed fracture: Secondary | ICD-10-CM | POA: Diagnosis not present

## 2022-11-06 DIAGNOSIS — Z8249 Family history of ischemic heart disease and other diseases of the circulatory system: Secondary | ICD-10-CM

## 2022-11-06 DIAGNOSIS — S72001A Fracture of unspecified part of neck of right femur, initial encounter for closed fracture: Secondary | ICD-10-CM

## 2022-11-06 DIAGNOSIS — Z96659 Presence of unspecified artificial knee joint: Secondary | ICD-10-CM | POA: Diagnosis present

## 2022-11-06 DIAGNOSIS — I69392 Facial weakness following cerebral infarction: Secondary | ICD-10-CM | POA: Diagnosis not present

## 2022-11-06 DIAGNOSIS — I739 Peripheral vascular disease, unspecified: Secondary | ICD-10-CM | POA: Diagnosis not present

## 2022-11-06 DIAGNOSIS — S728X1D Other fracture of right femur, subsequent encounter for closed fracture with routine healing: Secondary | ICD-10-CM | POA: Diagnosis not present

## 2022-11-06 DIAGNOSIS — I252 Old myocardial infarction: Secondary | ICD-10-CM | POA: Diagnosis not present

## 2022-11-06 DIAGNOSIS — Z950 Presence of cardiac pacemaker: Secondary | ICD-10-CM

## 2022-11-06 DIAGNOSIS — I5032 Chronic diastolic (congestive) heart failure: Secondary | ICD-10-CM | POA: Diagnosis not present

## 2022-11-06 DIAGNOSIS — Z4789 Encounter for other orthopedic aftercare: Secondary | ICD-10-CM | POA: Diagnosis not present

## 2022-11-06 DIAGNOSIS — Z91018 Allergy to other foods: Secondary | ICD-10-CM

## 2022-11-06 DIAGNOSIS — R053 Chronic cough: Secondary | ICD-10-CM | POA: Diagnosis present

## 2022-11-06 DIAGNOSIS — I959 Hypotension, unspecified: Secondary | ICD-10-CM | POA: Diagnosis not present

## 2022-11-06 DIAGNOSIS — Z043 Encounter for examination and observation following other accident: Secondary | ICD-10-CM | POA: Diagnosis not present

## 2022-11-06 DIAGNOSIS — R000 Tachycardia, unspecified: Secondary | ICD-10-CM | POA: Diagnosis not present

## 2022-11-06 DIAGNOSIS — Z7401 Bed confinement status: Secondary | ICD-10-CM | POA: Diagnosis not present

## 2022-11-06 DIAGNOSIS — E785 Hyperlipidemia, unspecified: Secondary | ICD-10-CM | POA: Diagnosis present

## 2022-11-06 DIAGNOSIS — M25551 Pain in right hip: Secondary | ICD-10-CM | POA: Diagnosis not present

## 2022-11-06 DIAGNOSIS — M79604 Pain in right leg: Secondary | ICD-10-CM | POA: Diagnosis not present

## 2022-11-06 DIAGNOSIS — M25572 Pain in left ankle and joints of left foot: Secondary | ICD-10-CM | POA: Diagnosis not present

## 2022-11-06 DIAGNOSIS — T8484XA Pain due to internal orthopedic prosthetic devices, implants and grafts, initial encounter: Secondary | ICD-10-CM | POA: Diagnosis not present

## 2022-11-06 DIAGNOSIS — I82431 Acute embolism and thrombosis of right popliteal vein: Secondary | ICD-10-CM | POA: Diagnosis not present

## 2022-11-06 HISTORY — PX: INTRAMEDULLARY (IM) NAIL INTERTROCHANTERIC: SHX5875

## 2022-11-06 LAB — CBC WITH DIFFERENTIAL/PLATELET
Abs Immature Granulocytes: 0.04 10*3/uL (ref 0.00–0.07)
Basophils Absolute: 0.1 10*3/uL (ref 0.0–0.1)
Basophils Relative: 0 %
Eosinophils Absolute: 0.1 10*3/uL (ref 0.0–0.5)
Eosinophils Relative: 1 %
HCT: 29.8 % — ABNORMAL LOW (ref 36.0–46.0)
Hemoglobin: 9.5 g/dL — ABNORMAL LOW (ref 12.0–15.0)
Immature Granulocytes: 0 %
Lymphocytes Relative: 17 %
Lymphs Abs: 1.9 10*3/uL (ref 0.7–4.0)
MCH: 26 pg (ref 26.0–34.0)
MCHC: 31.9 g/dL (ref 30.0–36.0)
MCV: 81.4 fL (ref 80.0–100.0)
Monocytes Absolute: 0.9 10*3/uL (ref 0.1–1.0)
Monocytes Relative: 7 %
Neutro Abs: 8.6 10*3/uL — ABNORMAL HIGH (ref 1.7–7.7)
Neutrophils Relative %: 75 %
Platelets: 279 10*3/uL (ref 150–400)
RBC: 3.66 MIL/uL — ABNORMAL LOW (ref 3.87–5.11)
RDW: 15.9 % — ABNORMAL HIGH (ref 11.5–15.5)
WBC: 11.6 10*3/uL — ABNORMAL HIGH (ref 4.0–10.5)
nRBC: 0 % (ref 0.0–0.2)

## 2022-11-06 LAB — TYPE AND SCREEN: ABO/RH(D): O POS

## 2022-11-06 LAB — BASIC METABOLIC PANEL
Anion gap: 14 (ref 5–15)
BUN: 21 mg/dL (ref 8–23)
CO2: 22 mmol/L (ref 22–32)
Calcium: 8.7 mg/dL — ABNORMAL LOW (ref 8.9–10.3)
Chloride: 104 mmol/L (ref 98–111)
Creatinine, Ser: 1.21 mg/dL — ABNORMAL HIGH (ref 0.44–1.00)
GFR, Estimated: 41 mL/min — ABNORMAL LOW (ref >60)
Glucose, Bld: 144 mg/dL — ABNORMAL HIGH (ref 70–99)
Potassium: 3.8 mmol/L (ref 3.5–5.1)
Sodium: 140 mmol/L (ref 135–145)

## 2022-11-06 LAB — SURGICAL PCR SCREEN
MRSA, PCR: NEGATIVE
Staphylococcus aureus: NEGATIVE

## 2022-11-06 LAB — PROTIME-INR
INR: 1.1 (ref 0.8–1.2)
Prothrombin Time: 14.2 seconds (ref 11.4–15.2)

## 2022-11-06 LAB — ABO/RH: ABO/RH(D): O POS

## 2022-11-06 SURGERY — FIXATION, FRACTURE, INTERTROCHANTERIC, WITH INTRAMEDULLARY ROD
Anesthesia: Spinal | Laterality: Right

## 2022-11-06 MED ORDER — FENTANYL CITRATE (PF) 100 MCG/2ML IJ SOLN
INTRAMUSCULAR | Status: AC
  Filled 2022-11-06: qty 2

## 2022-11-06 MED ORDER — CHLORHEXIDINE GLUCONATE 0.12 % MT SOLN: 15.0000 mL | Freq: Once | OROMUCOSAL | Status: AC

## 2022-11-06 MED ORDER — CHLORHEXIDINE GLUCONATE 0.12 % MT SOLN
OROMUCOSAL | Status: AC
  Administered 2022-11-06: 15 mL via OROMUCOSAL
  Filled 2022-11-06: qty 15

## 2022-11-06 MED ORDER — METOCLOPRAMIDE HCL 5 MG/ML IJ SOLN: 5.0000 mg | Freq: Three times a day (TID) | INTRAMUSCULAR | Status: DC | PRN

## 2022-11-06 MED ORDER — PHENYLEPHRINE 80 MCG/ML (10ML) SYRINGE FOR IV PUSH (FOR BLOOD PRESSURE SUPPORT)
PREFILLED_SYRINGE | INTRAVENOUS | Status: DC | PRN
  Administered 2022-11-06 (×2): 80 ug via INTRAVENOUS

## 2022-11-06 MED ORDER — PRAVASTATIN SODIUM 40 MG PO TABS
20.0000 mg | ORAL_TABLET | Freq: Every day | ORAL | Status: DC
  Administered 2022-11-07 – 2022-11-13 (×7): 20 mg via ORAL
  Filled 2022-11-06 (×7): qty 1

## 2022-11-06 MED ORDER — HYDROMORPHONE HCL 1 MG/ML IJ SOLN
0.5000 mg | INTRAMUSCULAR | Status: DC | PRN
  Administered 2022-11-06 – 2022-11-13 (×31): 0.5 mg via INTRAVENOUS
  Filled 2022-11-06 (×21): qty 0.5
  Filled 2022-11-06: qty 1
  Filled 2022-11-06 (×10): qty 0.5

## 2022-11-06 MED ORDER — MENTHOL 3 MG MT LOZG: 1.0000 | LOZENGE | OROMUCOSAL | Status: DC | PRN

## 2022-11-06 MED ORDER — 0.9 % SODIUM CHLORIDE (POUR BTL) OPTIME
TOPICAL | Status: DC | PRN
  Administered 2022-11-06: 1000 mL

## 2022-11-06 MED ORDER — POVIDONE-IODINE 10 % EX SWAB
2.0000 | Freq: Once | CUTANEOUS | Status: AC
  Administered 2022-11-06: 2 via TOPICAL

## 2022-11-06 MED ORDER — METHOCARBAMOL 500 MG PO TABS
500.0000 mg | ORAL_TABLET | Freq: Four times a day (QID) | ORAL | Status: DC | PRN
  Administered 2022-11-06 – 2022-11-13 (×12): 500 mg via ORAL
  Filled 2022-11-06 (×12): qty 1

## 2022-11-06 MED ORDER — ONDANSETRON HCL 4 MG/2ML IJ SOLN: 4.0000 mg | Freq: Once | INTRAMUSCULAR | Status: DC | PRN

## 2022-11-06 MED ORDER — PROPOFOL 500 MG/50ML IV EMUL
INTRAVENOUS | Status: DC | PRN
  Administered 2022-11-06: 15 ug/kg/min via INTRAVENOUS

## 2022-11-06 MED ORDER — POLYETHYLENE GLYCOL 3350 17 G PO PACK
17.0000 g | PACK | Freq: Every day | ORAL | Status: DC | PRN
  Administered 2022-11-08: 17 g via ORAL
  Filled 2022-11-06: qty 1

## 2022-11-06 MED ORDER — CHLORHEXIDINE GLUCONATE 4 % EX SOLN: 60.0000 mL | Freq: Once | CUTANEOUS | Status: DC

## 2022-11-06 MED ORDER — PHENYLEPHRINE HCL-NACL 20-0.9 MG/250ML-% IV SOLN
INTRAVENOUS | Status: DC | PRN
  Administered 2022-11-06 (×2): 30 ug/min via INTRAVENOUS

## 2022-11-06 MED ORDER — PROPOFOL 10 MG/ML IV BOLUS
INTRAVENOUS | Status: DC | PRN
  Administered 2022-11-06: 10 mg via INTRAVENOUS
  Administered 2022-11-06: 20 mg via INTRAVENOUS

## 2022-11-06 MED ORDER — TRANEXAMIC ACID-NACL 1000-0.7 MG/100ML-% IV SOLN
1000.0000 mg | INTRAVENOUS | Status: AC
  Administered 2022-11-06: 1000 mg via INTRAVENOUS
  Filled 2022-11-06: qty 100

## 2022-11-06 MED ORDER — DOCUSATE SODIUM 100 MG PO CAPS: 100.0000 mg | ORAL_CAPSULE | Freq: Two times a day (BID) | ORAL | Status: DC

## 2022-11-06 MED ORDER — ONDANSETRON HCL 4 MG/2ML IJ SOLN
4.0000 mg | Freq: Once | INTRAMUSCULAR | Status: AC
  Administered 2022-11-06: 4 mg via INTRAVENOUS
  Filled 2022-11-06: qty 2

## 2022-11-06 MED ORDER — MOMETASONE FURO-FORMOTEROL FUM 100-5 MCG/ACT IN AERO
2.0000 | INHALATION_SPRAY | Freq: Two times a day (BID) | RESPIRATORY_TRACT | Status: DC
  Administered 2022-11-06 – 2022-11-13 (×14): 2 via RESPIRATORY_TRACT
  Filled 2022-11-06 (×2): qty 8.8

## 2022-11-06 MED ORDER — PHENOL 1.4 % MT LIQD: 1.0000 | OROMUCOSAL | Status: DC | PRN

## 2022-11-06 MED ORDER — ONDANSETRON HCL 4 MG/2ML IJ SOLN: 4.0000 mg | Freq: Four times a day (QID) | INTRAMUSCULAR | Status: DC | PRN

## 2022-11-06 MED ORDER — METOCLOPRAMIDE HCL 5 MG PO TABS: 5.0000 mg | ORAL_TABLET | Freq: Three times a day (TID) | ORAL | Status: DC | PRN

## 2022-11-06 MED ORDER — PANTOPRAZOLE SODIUM 40 MG PO TBEC
40.0000 mg | DELAYED_RELEASE_TABLET | Freq: Every day | ORAL | Status: DC
  Administered 2022-11-07 – 2022-11-13 (×7): 40 mg via ORAL
  Filled 2022-11-06 (×7): qty 1

## 2022-11-06 MED ORDER — FENTANYL CITRATE PF 50 MCG/ML IJ SOSY: 50.0000 ug | PREFILLED_SYRINGE | INTRAMUSCULAR | Status: DC | PRN

## 2022-11-06 MED ORDER — METOPROLOL SUCCINATE ER 25 MG PO TB24
25.0000 mg | ORAL_TABLET | Freq: Every day | ORAL | Status: DC
  Administered 2022-11-08 – 2022-11-13 (×4): 25 mg via ORAL
  Filled 2022-11-06 (×7): qty 1

## 2022-11-06 MED ORDER — FENTANYL CITRATE PF 50 MCG/ML IJ SOSY
100.0000 ug | PREFILLED_SYRINGE | INTRAMUSCULAR | Status: AC | PRN
  Administered 2022-11-06 (×2): 100 ug via INTRAVENOUS
  Filled 2022-11-06 (×2): qty 2

## 2022-11-06 MED ORDER — OXYCODONE HCL 5 MG PO TABS
5.0000 mg | ORAL_TABLET | ORAL | Status: DC | PRN
  Administered 2022-11-06 – 2022-11-13 (×15): 5 mg via ORAL
  Filled 2022-11-06 (×17): qty 1

## 2022-11-06 MED ORDER — SPIRONOLACTONE 12.5 MG HALF TABLET
12.5000 mg | ORAL_TABLET | Freq: Every day | ORAL | Status: DC
  Filled 2022-11-06: qty 1

## 2022-11-06 MED ORDER — CEFAZOLIN SODIUM-DEXTROSE 2-4 GM/100ML-% IV SOLN
2.0000 g | INTRAVENOUS | Status: AC
  Administered 2022-11-06: 2 g via INTRAVENOUS
  Filled 2022-11-06: qty 100

## 2022-11-06 MED ORDER — LACTATED RINGERS IV SOLN: INTRAVENOUS | Status: DC

## 2022-11-06 MED ORDER — MELATONIN 3 MG PO TABS
3.0000 mg | ORAL_TABLET | Freq: Once | ORAL | Status: AC
  Administered 2022-11-07: 3 mg via ORAL
  Filled 2022-11-06: qty 1

## 2022-11-06 MED ORDER — BUPIVACAINE IN DEXTROSE 0.75-8.25 % IT SOLN
INTRATHECAL | Status: DC | PRN
  Administered 2022-11-06: 1.8 mL via INTRATHECAL

## 2022-11-06 MED ORDER — ASPIRIN 81 MG PO CHEW
81.0000 mg | CHEWABLE_TABLET | Freq: Every day | ORAL | Status: DC
  Administered 2022-11-06 – 2022-11-13 (×8): 81 mg via ORAL
  Filled 2022-11-06 (×8): qty 1

## 2022-11-06 MED ORDER — ONDANSETRON HCL 4 MG PO TABS: 4.0000 mg | ORAL_TABLET | Freq: Four times a day (QID) | ORAL | Status: DC | PRN

## 2022-11-06 MED ORDER — CEFAZOLIN SODIUM-DEXTROSE 2-4 GM/100ML-% IV SOLN
2.0000 g | Freq: Four times a day (QID) | INTRAVENOUS | Status: AC
  Administered 2022-11-06 – 2022-11-07 (×2): 2 g via INTRAVENOUS
  Filled 2022-11-06 (×2): qty 100

## 2022-11-06 MED ORDER — ORAL CARE MOUTH RINSE: 15.0000 mL | Freq: Once | OROMUCOSAL | Status: AC

## 2022-11-06 MED ORDER — FENTANYL CITRATE (PF) 100 MCG/2ML IJ SOLN
INTRAMUSCULAR | Status: DC | PRN
  Administered 2022-11-06: 25 ug via INTRAVENOUS
  Administered 2022-11-06: 75 ug via INTRAVENOUS

## 2022-11-06 MED ORDER — ISOSORBIDE MONONITRATE ER 30 MG PO TB24
30.0000 mg | ORAL_TABLET | Freq: Every day | ORAL | Status: DC
  Administered 2022-11-08 – 2022-11-13 (×6): 30 mg via ORAL
  Filled 2022-11-06 (×7): qty 1

## 2022-11-06 MED ORDER — ENOXAPARIN SODIUM 30 MG/0.3ML IJ SOSY
30.0000 mg | PREFILLED_SYRINGE | INTRAMUSCULAR | Status: DC
  Administered 2022-11-07 – 2022-11-09 (×3): 30 mg via SUBCUTANEOUS
  Filled 2022-11-06 (×3): qty 0.3

## 2022-11-06 MED ORDER — BISACODYL 5 MG PO TBEC
5.0000 mg | DELAYED_RELEASE_TABLET | Freq: Every day | ORAL | Status: DC | PRN
  Administered 2022-11-08 – 2022-11-13 (×4): 5 mg via ORAL
  Filled 2022-11-06 (×4): qty 1

## 2022-11-06 MED ORDER — DOCUSATE SODIUM 100 MG PO CAPS
100.0000 mg | ORAL_CAPSULE | Freq: Two times a day (BID) | ORAL | Status: DC
  Administered 2022-11-06 – 2022-11-13 (×14): 100 mg via ORAL
  Filled 2022-11-06 (×14): qty 1

## 2022-11-06 MED ORDER — ACETAMINOPHEN 325 MG PO TABS: 650.0000 mg | ORAL_TABLET | Freq: Four times a day (QID) | ORAL | Status: DC | PRN

## 2022-11-06 MED ORDER — FENTANYL CITRATE (PF) 100 MCG/2ML IJ SOLN
25.0000 ug | INTRAMUSCULAR | Status: DC | PRN
  Administered 2022-11-06: 25 ug via INTRAVENOUS

## 2022-11-06 MED ORDER — METHOCARBAMOL 1000 MG/10ML IJ SOLN
500.0000 mg | Freq: Four times a day (QID) | INTRAVENOUS | Status: DC | PRN
  Filled 2022-11-06: qty 5

## 2022-11-06 SURGICAL SUPPLY — 59 items
BAG COUNTER SPONGE SURGICOUNT (BAG) ×1 IMPLANT
BIT DRILL CROWE POINT TWST 4.3 (DRILL) IMPLANT
BNDG COHESIVE 6X5 TAN ST LF (GAUZE/BANDAGES/DRESSINGS) IMPLANT
BRUSH SCRUB EZ PLAIN DRY (MISCELLANEOUS) ×2 IMPLANT
CORTICAL BONE SCR 5.0MM X 48MM (Screw) ×1 IMPLANT
COVER PERINEAL POST (MISCELLANEOUS) ×1 IMPLANT
COVER SURGICAL LIGHT HANDLE (MISCELLANEOUS) ×2 IMPLANT
DRAPE C-ARM 42X72 X-RAY (DRAPES) ×1 IMPLANT
DRAPE C-ARMOR (DRAPES) ×1 IMPLANT
DRAPE HALF SHEET 40X57 (DRAPES) IMPLANT
DRAPE INCISE IOBAN 66X45 STRL (DRAPES) ×1 IMPLANT
DRAPE ORTHO SPLIT 77X108 STRL (DRAPES)
DRAPE SURG ORHT 6 SPLT 77X108 (DRAPES) IMPLANT
DRAPE U-SHAPE 47X51 STRL (DRAPES) ×1 IMPLANT
DRESSING MEPILEX FLEX 4X4 (GAUZE/BANDAGES/DRESSINGS) ×1 IMPLANT
DRILL CROWE POINT TWIST 4.3 (DRILL) ×1
DRSG AQUACEL AG ADV 3.5X14 (GAUZE/BANDAGES/DRESSINGS) IMPLANT
DRSG EMULSION OIL 3X3 NADH (GAUZE/BANDAGES/DRESSINGS) ×1 IMPLANT
DRSG MEPILEX FLEX 4X4 (GAUZE/BANDAGES/DRESSINGS) ×1
DRSG MEPILEX POST OP 4X8 (GAUZE/BANDAGES/DRESSINGS) ×1 IMPLANT
ELECT REM PT RETURN 9FT ADLT (ELECTROSURGICAL) ×1
ELECTRODE REM PT RTRN 9FT ADLT (ELECTROSURGICAL) ×1 IMPLANT
GLOVE BIO SURGEON STRL SZ7.5 (GLOVE) ×1 IMPLANT
GLOVE BIO SURGEON STRL SZ8 (GLOVE) ×1 IMPLANT
GLOVE BIOGEL PI IND STRL 7.5 (GLOVE) ×1 IMPLANT
GLOVE BIOGEL PI IND STRL 8 (GLOVE) ×1 IMPLANT
GLOVE SURG ORTHO LTX SZ7.5 (GLOVE) ×2 IMPLANT
GOWN STRL REUS W/ TWL LRG LVL3 (GOWN DISPOSABLE) ×2 IMPLANT
GOWN STRL REUS W/ TWL XL LVL3 (GOWN DISPOSABLE) ×1 IMPLANT
GOWN STRL REUS W/TWL LRG LVL3 (GOWN DISPOSABLE) ×2
GOWN STRL REUS W/TWL XL LVL3 (GOWN DISPOSABLE) ×1
GUIDEPIN VERSANAIL DSP 3.2X444 (ORTHOPEDIC DISPOSABLE SUPPLIES) IMPLANT
GUIDEWIRE BALL NOSE 100CM (WIRE) IMPLANT
HIP FRA NAIL LAG SCREW 10.5X90 (Orthopedic Implant) ×1 IMPLANT
KIT BASIN OR (CUSTOM PROCEDURE TRAY) ×1 IMPLANT
KIT TURNOVER KIT B (KITS) ×1 IMPLANT
MANIFOLD NEPTUNE II (INSTRUMENTS) ×1 IMPLANT
NAIL HIP FRAC RT 130 11MX400M (Nail) IMPLANT
NS IRRIG 1000ML POUR BTL (IV SOLUTION) ×1 IMPLANT
PACK GENERAL/GYN (CUSTOM PROCEDURE TRAY) ×1 IMPLANT
PAD ARMBOARD 7.5X6 YLW CONV (MISCELLANEOUS) ×2 IMPLANT
PIN GUIDE DRILL TIP 2.8X300 (DRILL) IMPLANT
SCREW BONE CORTICAL 5.0X42 (Screw) IMPLANT
SCREW CORTICL BON 5.0MM X 48MM (Screw) IMPLANT
SCREW LAG HIP FRA NAIL 10.5X90 (Orthopedic Implant) IMPLANT
STAPLER VISISTAT 35W (STAPLE) ×1 IMPLANT
STOCKINETTE IMPERVIOUS LG (DRAPES) IMPLANT
SUT ETHILON 2 0 PSLX (SUTURE) ×1 IMPLANT
SUT TIGERTAPE CERCLAGE TGRLNK (SUTURE) ×2
SUT VIC AB 0 CT1 27 (SUTURE) ×1
SUT VIC AB 0 CT1 27XBRD ANBCTR (SUTURE) ×1 IMPLANT
SUT VIC AB 1 CT1 27 (SUTURE) ×1
SUT VIC AB 1 CT1 27XBRD ANBCTR (SUTURE) ×1 IMPLANT
SUT VIC AB 2-0 CT1 27 (SUTURE) ×1
SUT VIC AB 2-0 CT1 TAPERPNT 27 (SUTURE) ×1 IMPLANT
SUTURE TIGERTAPE CERCLG TGRLNK (SUTURE) IMPLANT
TOWEL GREEN STERILE (TOWEL DISPOSABLE) ×2 IMPLANT
TOWEL GREEN STERILE FF (TOWEL DISPOSABLE) ×1 IMPLANT
WATER STERILE IRR 1000ML POUR (IV SOLUTION) ×1 IMPLANT

## 2022-11-06 NOTE — ED Notes (Signed)
Pt is lying on a hospital bed with her R leg bent at the knee. Pt states this is the only position she can have any relief from the pain. Was given IV fentanyl for 9/10 pain. Pt makes a slight grunting noise with breathing. Pt states this is her baseline after having an MI years ago. Denies SOB. A&O x4.

## 2022-11-06 NOTE — Op Note (Addendum)
11/06/2022 1:51 PM  PATIENT:  Dana Morrison  89 y.o. female  PRE-OPERATIVE DIAGNOSIS:  1. RIGHT PROXIMAL FEMORAL SHAFT FRACTURE 2. RETAINED RIGHT FEMORAL NECK SCREWS  POST-OPERATIVE DIAGNOSIS:   1. RIGHT PROXIMAL FEMORAL SHAFT FRACTURE 2. RETAINED RIGHT FEMORAL NECK SCREWS  PROCEDURES:   1. INTRAMEDULLARY NAILING OF THE RIGHT FEMUR with BIOMET AFFIXUS 11 X 400 mm, locked 2. REMOVAL OF DEEP IMPLANT  SURGEON:  Surgeon(s) and Role:    * Myrene Galas, MD - Primary  PHYSICIAN ASSISTANT: Montez Morita, PA-C  ANESTHESIA:   spinal  EBL:  200 mL   BLOOD ADMINISTERED:none  Crystalloid: 400 mL  DRAINS: none   LOCAL MEDICATIONS USED:  NONE  SPECIMEN:  No Specimen  DISPOSITION OF SPECIMEN:  N/A  COUNTS:  YES  DICTATION: .Note written in EPIC  PLAN OF CARE: Admit to inpatient   PATIENT DISPOSITION:  PACU - hemodynamically stable.   Delay start of Pharmacological VTE agent (>24hrs) due to surgical blood loss or risk of bleeding: no  BRIEF SUMMARY AND INDICATION OF PROCEDURE:  Dana Morrison is a 89 y.o. year-old with multiple medical problems who sustained a comminuted proximal femur fracture below previously placed femoral neck screws.  I discussed with the patient and her son the risks and benefits of surgical treatment including the potential for malunion, nonunion, symptomatic hardware, heart attack, stroke, neurovascular injury, bleeding, and others.  After acknowledging these risks, consent was provided to proceed.  BRIEF SUMMARY OF PROCEDURE:  The patient was taken to the operating room where spinal anesthesia was performed, and then she was positioned supine on the radiolucent table with a bump under the operative hip. Traction was used to maintain reduction while standard prep with chlorhexidene wash and then betadine scrub and paint was performed. After sterile drapes and time-out, a long instrument was used to identify the appropriate incisions for reduction and fixation  of the fracture components under C-arm on both AP and lateral images.   At the shaft fracture site I made a longitudinal incision through the IT band, then split the vastus sweeping the muscle anteriorly, using electrocautery to ligate the perforating vessels, and expose the fracture site.  Using lavage it was cleared of hematoma and an anatomic reduction obtained at that level and held provisionally with a Verbrugge clamp.  I then placed 2 Arthrex suture tape cerclages.   Next I turned attention proximally and identified the appropriate incision for removal of the 3 cannulated screws.  This was performed without complication.  Next, a 3 cm incision was made proximal to the greater trochanter.  The curved cannulated awl was inserted medial to the trochanter and then the starting guidewire advanced into the proximal femur.  This was checked on AP and lateral views.  My assistant applied attraction and derotated the fracture to dial in the reduction of the intertrochanteric component. The ball-tipped guidewire was then passed safely crossed the fracture site on AP and LAT views and that it stayed posterior in the distal femur. We sequentially reamed up to 13 mm, making sure the fracture remained reduced during reaming, and inserted a 11 x 400 mm nail to the appropriate depth.  The guidewire for the lag screw was then inserted in appropriate anteversion to make sure it was in a center-center position within the femoral head.  This was measured and the lag screw placed with excellent purchase and position as checked on both views.  I turned attention distally where two static locking bolts were placed using  perfect circle technique.  Final images were obtained showing excellent reduction, with appropriate hardware placement, trajectory and length.  Montez Morita, PA-C assisted throughout the case with establishment and maintenance of reduction, reaming while I held reduction, as well as some instrumentation, as well  as wound closure. We irrigated and closed the wound in standard layered fashion using running 0 and figure-of-eight 0 Vicryl for the vastus lateralis, interrupted figure-of- eight #1 Vicryl for the tensor and then 2-0 Vicryl and 2-0 nylon for the skin.  Sterile gently compressive dressing was applied.  The patient was awakened from anesthesia and transported to the PACU in stable condition.   PROGNOSIS:  Dana Morrison will have immediate range of motion of the knee, hip and ankle with touch down weight bearing for a few weeks and then progression to weightbearing as tolerated. Formal pharmacologic DVT prophylaxis with Lovenox. A two to three day stay prior to discharge would be anticipated depending on progression. Office follow up in 2 weeks for removal of sutures and new x-rays.   In the event hospital discharge has to be delayed for some reason, I let the family know of the possibility of [facility] as an option to attend her birthday this Saturday at the [facility], where a host of relatives across the country have planned to convene for this major family event .     Dana Morrison. Carola Frost, M.D.

## 2022-11-06 NOTE — Anesthesia Postprocedure Evaluation (Signed)
Anesthesia Post Note  Patient: Dana Morrison  Procedure(s) Performed: HIP NAILING  AND REMOVAL OF HARDWARE (Right)     Patient location during evaluation: PACU Anesthesia Type: Spinal Level of consciousness: oriented and awake and alert Pain management: pain level controlled Vital Signs Assessment: post-procedure vital signs reviewed and stable Respiratory status: spontaneous breathing, respiratory function stable, patient connected to nasal cannula oxygen and nonlabored ventilation Cardiovascular status: blood pressure returned to baseline and stable Postop Assessment: no headache, no backache, no apparent nausea or vomiting, spinal receding and patient able to bend at knees Anesthetic complications: no   No notable events documented.  Last Vitals:  Vitals:   11/06/22 1400 11/06/22 1415  BP: (!) 102/57 (!) 87/57  Pulse: 89   Resp: 11   Temp:    SpO2: 100%     Last Pain:  Vitals:   11/06/22 1415  TempSrc:   PainSc: Asleep                 Nobuko Gsell A.

## 2022-11-06 NOTE — Plan of Care (Signed)
  Problem: Safety: Goal: Ability to remain free from injury will improve Outcome: Progressing   Problem: Clinical Measurements: Goal: Respiratory complications will improve Outcome: Not Progressing   Problem: Activity: Goal: Risk for activity intolerance will decrease Outcome: Not Progressing   Problem: Pain Managment: Goal: General experience of comfort will improve Outcome: Not Progressing

## 2022-11-06 NOTE — Anesthesia Procedure Notes (Signed)
Spinal  Patient location during procedure: OR Start time: 11/06/2022 10:52 AM End time: 11/06/2022 10:57 AM Reason for block: surgical anesthesia Staffing Performed: anesthesiologist  Anesthesiologist: Mal Amabile, MD Performed by: Mal Amabile, MD Authorized by: Mal Amabile, MD   Preanesthetic Checklist Completed: patient identified, IV checked, site marked, risks and benefits discussed, surgical consent, monitors and equipment checked, pre-op evaluation and timeout performed Spinal Block Patient position: right lateral decubitus Prep: DuraPrep and site prepped and draped Patient monitoring: heart rate, cardiac monitor, continuous pulse ox and blood pressure Approach: midline Location: L3-4 Injection technique: single-shot Needle Needle type: Pencan  Needle gauge: 24 G Needle length: 9 cm Needle insertion depth: 7 cm Assessment Sensory level: T6 Events: CSF return Additional Notes Patient tolerated procedure well. Adequate sensory level.

## 2022-11-06 NOTE — ED Notes (Signed)
Report called to Marissa, RN

## 2022-11-06 NOTE — ED Notes (Signed)
Pt placed on 1.5  liters oxygen via [place] after fent given sats dropped oxygen brought it back to wnl

## 2022-11-06 NOTE — Consult Note (Signed)
Reason for Consult:Right hip fx Referring Physician: Jonah Blue Time called: 0730 Time at bedside: 0845   Dana Morrison is an 89 y.o. female.  HPI: Dana Morrison was sitting down and thinks she missed the chair and fell to the floor. She had immediate right hip pain and could not get up. She was brought to the ED where x-rays showed a periprosthetic hip fx and orthopedic surgery was consulted. She lives in independent living at [facility].  Past Medical History:  Diagnosis Date   Atrial fibrillation (HCC)    Chronic diastolic heart failure (HCC) 09/12/2017   Coronary artery disease    Decreased vision 09/12/2017   Hx of myocardial infarction 2010   Hypertension    Osteoarthritis of hip 09/12/2017   Osteoarthritis of knee 09/12/2017   Paroxysmal atrial fibrillation (HCC) 09/12/2017   Scalp psoriasis 09/12/2017   Seasonal allergic rhinitis due to pollen 09/12/2017   Urticaria 09/12/2017   Venous insufficiency 09/12/2017    Past Surgical History:  Procedure Laterality Date   APPENDECTOMY     ARM FRACTURE Left    FRACTURE SURGERY Bilateral    WRISTS   HIP PINNING,CANNULATED Right 08/23/2022   Procedure: CANNULATED HIP PINNING;  Surgeon: Myrene Galas, MD;  Location: [facility] OR;  Service: Orthopedics;  Laterality: Right;   LAPAROSCOPIC LYSIS OF ADHESIONS     PACEMAKER INSERTION     PLACEMENT OF STENTS     X5   REPLACEMENT TOTAL KNEE     REVISION TOTAL HIP ARTHROPLASTY Left     Family History  Problem Relation Age of Onset   Microcephaly Mother    Heart attack Mother    Heart attack Father    Lung cancer Brother    Other Brother        BACK PROBLEMS   Healthy Daughter    Healthy Daughter    Healthy Daughter    Healthy Son     Social History:  reports that she quit smoking about 27 years ago. Her smoking use included cigarettes. She has a 3.75 pack-year smoking history. She has never used smokeless tobacco. She reports current alcohol use. She reports that she does not use  drugs.  Allergies:  Allergies  Allergen Reactions   Psyllium Diarrhea   Other     Per patient she has an intolerance to some legumes. She does not have a true allergy. Pt's son confirmed this as well.     Medications: I have reviewed the patient's current medications.  Results for orders placed or performed during the hospital encounter of 11/06/22 (from the past 48 hour(s))  Type and screen [facility]     Status: None   Collection Time: 11/06/22  5:30 AM  Result Value Ref Range   ABO/RH(D) O POS    Antibody Screen NEG    Sample Expiration      11/09/2022,2359 Performed at [facility], [address], [place], [place] [address]   Basic metabolic panel     Status: Abnormal   Collection Time: 11/06/22  5:35 AM  Result Value Ref Range   Sodium 140 135 - 145 mmol/L   Potassium 3.8 3.5 - 5.1 mmol/L   Chloride 104 98 - 111 mmol/L   CO2 22 22 - 32 mmol/L   Glucose, Bld 144 (H) 70 - 99 mg/dL    Comment: Glucose reference range applies only to samples taken after fasting for at least 8 hours.   BUN 21 8 - 23 mg/dL  Creatinine, Ser 1.21 (H) 0.44 - 1.00 mg/dL   Calcium 8.7 (L) 8.9 - 10.3 mg/dL   GFR, Estimated 41 (L) >60 mL/min    Comment: (NOTE) Calculated using the CKD-EPI Creatinine Equation (2021)    Anion gap 14 5 - 15    Comment: Performed at [facility], [address], [place], [place] [address]  CBC with Differential     Status: Abnormal   Collection Time: 11/06/22  5:35 AM  Result Value Ref Range   WBC 11.6 (H) 4.0 - 10.5 K/uL   RBC 3.66 (L) 3.87 - 5.11 MIL/uL   Hemoglobin 9.5 (L) 12.0 - 15.0 g/dL   HCT 60.4 (L) 54.0 - 98.1 %   MCV 81.4 80.0 - 100.0 fL   MCH 26.0 26.0 - 34.0 pg   MCHC 31.9 30.0 - 36.0 g/dL   RDW 19.1 (H) 47.8 - 29.5 %   Platelets 279 150 - 400 K/uL   nRBC 0.0 0.0 - 0.2 %   Neutrophils Relative % 75 %   Neutro Abs 8.6 (H) 1.7 - 7.7 K/uL   Lymphocytes Relative 17 %   Lymphs Abs 1.9 0.7 - 4.0 K/uL   Monocytes  Relative 7 %   Monocytes Absolute 0.9 0.1 - 1.0 K/uL   Eosinophils Relative 1 %   Eosinophils Absolute 0.1 0.0 - 0.5 K/uL   Basophils Relative 0 %   Basophils Absolute 0.1 0.0 - 0.1 K/uL   Immature Granulocytes 0 %   Abs Immature Granulocytes 0.04 0.00 - 0.07 K/uL    Comment: Performed at [facility], [address], [place], [place] [address]  Protime-INR     Status: None   Collection Time: 11/06/22  5:35 AM  Result Value Ref Range   Prothrombin Time 14.2 11.4 - 15.2 seconds   INR 1.1 0.8 - 1.2    Comment: (NOTE) INR goal varies based on device and disease states. Performed at [facility], [address], [place], [place] [address]   ABO/Rh     Status: None   Collection Time: 11/06/22  5:35 AM  Result Value Ref Range   ABO/RH(D)      O POS Performed at [facility], [address], [place], [place] [address]     DG FEMUR, MIN 2 VIEWS RIGHT  Result Date: 11/06/2022 CLINICAL DATA:  89 year old female with history of trauma from a fall. Leg deformity. EXAM: RIGHT FEMUR 2 VIEWS COMPARISON:  Right hip radiograph 08/23/2022. FINDINGS: Compared to the prior examination there has been interval fracture of the right proximal femur, which appears to involve the intertrochanteric region and the proximal third of the right femoral diaphysis. This fracture is displaced and comminuted, with proximally 2 cm of medial displacement of the femoral diaphysis relative to the proximal fracture fragment. Three cannulated fixation screws are again noted in the right femoral neck. Old healed fractures of the right superior and inferior pubic rami are again noted. IMPRESSION: 1. Status post ORIF in the right femoral neck with interval development of a displaced comminuted fracture involving both the intertrochanteric regions in the proximal third of the right femoral diaphysis, as above. Electronically Signed   By: Trudie Reed M.D.   On: 11/06/2022 06:30   DG Chest Port 1  View  Result Date: 11/06/2022 CLINICAL DATA:  89 year old female with history of chest pain and shortness of breath. EXAM: PORTABLE CHEST 1 VIEW COMPARISON:  Chest x-ray 10/27/2022. FINDINGS: Lung volumes are slightly  low. No acute consolidative airspace disease. No pleural effusions. No pneumothorax. No evidence of pulmonary edema. Heart size is borderline enlarged. Upper mediastinal contours are within normal limits. Atherosclerotic calcifications are noted in the thoracic aorta. Left-sided pacemaker device in place with lead tips projecting over the expected location of the right atrium and right ventricle. IMPRESSION: 1. No radiographic evidence of acute cardiopulmonary disease. 2. Aortic atherosclerosis. Electronically Signed   By: Trudie Reed M.D.   On: 11/06/2022 06:28   DG Knee Right Port  Result Date: 11/06/2022 CLINICAL DATA:  89 year old female with history of fall. Legs deformity. EXAM: PORTABLE RIGHT KNEE - 1-2 VIEW COMPARISON:  No priors. FINDINGS: Single lateral view of the right knee demonstrates no definite acute displaced fracture. Degenerative changes of osteoarthritis are noted. IMPRESSION: 1. Limited lateral view of the right knee demonstrates no definite acute posttraumatic findings. Electronically Signed   By: Trudie Reed M.D.   On: 11/06/2022 06:26    Review of Systems  HENT:  Negative for ear discharge, ear pain, hearing loss and tinnitus.   Eyes:  Negative for photophobia and pain.  Respiratory:  Negative for cough and shortness of breath.   Cardiovascular:  Negative for chest pain.  Gastrointestinal:  Negative for abdominal pain, nausea and vomiting.  Genitourinary:  Negative for dysuria, flank pain, frequency and urgency.  Musculoskeletal:  Positive for arthralgias (Right hip). Negative for back pain, myalgias and neck pain.  Neurological:  Negative for dizziness and headaches.  Hematological:  Does not bruise/bleed easily.  Psychiatric/Behavioral:  The patient  is not nervous/anxious.    Blood pressure 116/67, pulse 90, temperature 98 F (36.7 C), temperature source Oral, resp. rate (!) 34, height 5\' 7"  (1.702 m), weight 80.7 kg, SpO2 100 %. Physical Exam Constitutional:      General: She is not in acute distress.    Appearance: She is well-developed. She is not diaphoretic.  HENT:     Head: Normocephalic and atraumatic.  Eyes:     General: No scleral icterus.       Right eye: No discharge.        Left eye: No discharge.     Conjunctiva/sclera: Conjunctivae normal.  Cardiovascular:     Rate and Rhythm: Normal rate and regular rhythm.  Pulmonary:     Effort: Pulmonary effort is normal. No respiratory distress.  Musculoskeletal:     Cervical back: Normal range of motion.     Comments: RLE No traumatic wounds, ecchymosis, or rash  Severe TTP hip, laying with leg ext rotated and foot tucked under contralateral knee  No knee or ankle effusion  Sens DPN intact, SPN absent, TN paresthetic  Motor EHL, ext, flex, evers 5/5  DP 2+, PT 1+, No significant edema  Skin:    General: Skin is warm and dry.  Neurological:     Mental Status: She is alert.  Psychiatric:        Mood and Affect: Mood normal.        Behavior: Behavior normal.     Assessment/Plan: Right hip fx -- Will need hardware removal and IMN today with Dr. Carola Frost. Please keep NPO.    Freeman Caldron, PA-C Orthopedic Surgery [phone] 11/06/2022, 9:05 AM

## 2022-11-06 NOTE — H&P (Signed)
History and Physical    Patient: Dana Morrison ZOX:[id number] DOB: [birth year] DOA: 11/06/2022 DOS: the patient was seen and examined on 11/06/2022 PCP: Charlane Ferretti, DO  Patient coming from: ALF/ILF - [facility]; NOK: [place], [place], [phone]   Chief Complaint: Fall  HPI: Dana Morrison is a 89 y.o. female with medical history significant of afib on Eliquis, chronic diastolic CHF, CAD, and HTN presenting with a fall.  She was last admitted from 4/20-23 with acute on chronic diastolic CHF and prior admission in 08/2022 for R hip fracture and subsequent 09/2022 admission for GI bleeding.  Eliquis is currently on hold due to prior significant GI bleed.  She reports that she got up overnight and balanced herself on the arm of the chair and then fell to the floor, landing on her R hip.  No other injuries.    ER Course:  Carryover, per Dr. Imogene Burn:  89 yo WF, prior proximal right femur fracture in 08/2022 s/p cannulated screw fixation, fell last night. Now with fracture distal to screw fixation. Looks like she may have also displaced some of the screws with her fall. EDP has discussed with ortho(haddix). NPO.      Review of Systems: As mentioned in the history of present illness. All other systems reviewed and are negative. Past Medical History:  Diagnosis Date   Atrial fibrillation (HCC)    Chronic diastolic heart failure (HCC) 09/12/2017   Coronary artery disease    Decreased vision 09/12/2017   Hx of myocardial infarction 2010   Hypertension    Osteoarthritis of hip 09/12/2017   Osteoarthritis of knee 09/12/2017   Paroxysmal atrial fibrillation (HCC) 09/12/2017   Scalp psoriasis 09/12/2017   Seasonal allergic rhinitis due to pollen 09/12/2017   Urticaria 09/12/2017   Venous insufficiency 09/12/2017   Past Surgical History:  Procedure Laterality Date   APPENDECTOMY     ARM FRACTURE Left    FRACTURE SURGERY Bilateral    WRISTS   HIP PINNING,CANNULATED Right 08/23/2022    Procedure: CANNULATED HIP PINNING;  Surgeon: Myrene Galas, MD;  Location: [facility] OR;  Service: Orthopedics;  Laterality: Right;   LAPAROSCOPIC LYSIS OF ADHESIONS     PACEMAKER INSERTION     PLACEMENT OF STENTS     X5   REPLACEMENT TOTAL KNEE     REVISION TOTAL HIP ARTHROPLASTY Left    Social History:  reports that she quit smoking about 27 years ago. Her smoking use included cigarettes. She has a 3.75 pack-year smoking history. She has never used smokeless tobacco. She reports current alcohol use. She reports that she does not use drugs.  Allergies  Allergen Reactions   Psyllium Diarrhea   Other     Per patient she has an intolerance to some legumes. She does not have a true allergy. Pt's son confirmed this as well.     Family History  Problem Relation Age of Onset   Microcephaly Mother    Heart attack Mother    Heart attack Father    Lung cancer Brother    Other Brother        BACK PROBLEMS   Healthy Daughter    Healthy Daughter    Healthy Daughter    Healthy Son     Prior to Admission medications   Medication Sig Start Date End Date Taking? Authorizing Provider  acetaminophen (TYLENOL) 325 MG tablet Take 2 tablets (650 mg total) by mouth every 8 (eight) hours as needed for moderate pain or mild pain. 08/24/22  Yes Montez Morita, PA-C  aspirin 81 MG chewable tablet Chew 1 tablet by mouth daily. 05/19/17  Yes [provider]  bumetanide (BUMEX) 1 MG tablet Take 1 tablet (1 mg total) by mouth daily. Please take twice daily in case of weight gain 2 to 3 lbs in 24 hrs or 5 lbs in 7 days. 10/30/22  Yes Arrien, York Ram, MD  calcium-vitamin D (OSCAL WITH D) 500-5 MG-MCG tablet Take 1 tablet by mouth daily with breakfast.   Yes [provider]  empagliflozin (JARDIANCE) 10 MG TABS tablet Take 1 tablet (10 mg total) by mouth daily. 10/30/22 11/29/22 Yes Arrien, York Ram, MD  fluticasone-salmeterol [facility] INHUB) 100-50 MCG/ACT AEPB Inhale 1 puff into the lungs 2  (two) times daily. 08/03/22  Yes Luciano Cutter, MD  guaiFENesin-dextromethorphan [facility] DM) 100-10 MG/5ML syrup Take 10 mLs by mouth every 6 (six) hours as needed for cough. 08/28/22  Yes Dorcas Carrow, MD  isosorbide mononitrate (IMDUR) 30 MG 24 hr tablet TAKE 1 TABLET EVERY DAY Patient taking differently: Take 30 mg by mouth daily. 05/02/22  Yes Wendall Stade, MD  loperamide (IMODIUM) 2 MG capsule Take 2 mg by mouth 2 (two) times daily before lunch and supper.   Yes [provider]  metoprolol succinate (TOPROL-XL) 25 MG 24 hr tablet TAKE 1 TABLET EVERY DAY Patient taking differently: Take 25 mg by mouth daily. 05/02/22  Yes Wendall Stade, MD  Multiple Vitamin (MULTIVITAMIN) capsule Take 1 capsule by mouth daily.   Yes [provider]  pantoprazole (PROTONIX) 40 MG tablet Take 1 tablet (40 mg total) by mouth daily. 08/28/22  Yes Dorcas Carrow, MD  pravastatin (PRAVACHOL) 20 MG tablet TAKE 1 TABLET EVERY DAY (NEED MD APPOINTMENT) Patient taking differently: Take 20 mg by mouth daily. 03/21/22  Yes Wendall Stade, MD  Probiotic Product (PROBIOTIC PO) Take 1 capsule by mouth daily.   Yes [provider]  spironolactone (ALDACTONE) 25 MG tablet Take 1/2 tablet (12.5 mg total) by mouth daily. 10/30/22 11/29/22 Yes Arrien, York Ram, MD  ferrous sulfate 325 (65 FE) MG tablet Take 325 mg by mouth daily with breakfast. Patient not taking: Reported on 11/06/2022    [provider]  MULTAQ 400 MG tablet TAKE 1 TABLET TWICE DAILY WITH MEALS Patient not taking: Reported on 11/06/2022 02/23/22   Wendall Stade, MD  nitroGLYCERIN (NITROSTAT) 0.4 MG SL tablet Place 0.4 mg under the tongue every 5 (five) minutes as needed for chest pain. Patient not taking: Reported on 11/06/2022    [provider]    Physical Exam: Vitals:   11/06/22 0630 11/06/22 0645 11/06/22 0715 11/06/22 0745  BP: 120/62 124/70 121/75 116/67  Pulse: 81 80 86 90  Resp: (!) 28  (!) 38 (!) 28 (!) 34  Temp:    98 F (36.7 C)  TempSrc:    Oral  SpO2: 100% 98% 100% 100%  Weight:      Height:        General:  Appears calm and comfortable and is in NAD; hip pain with movement and periodically yelling out from apparent spasm Eyes:  EOMI, normal lids, iris ENT: hard of hearing,  grossly normal lips & tongue, mmm Neck:  no LAD, masses or thyromegaly Cardiovascular:  RRR, no m/r/g. No LE edema.  Respiratory:   CTA bilaterally with no wheezes/rales/rhonchi.  Normal respiratory effort.   Abdomen:  soft, NT, ND Skin:  no rash or induration seen on limited exam  Musculoskeletal:  R leg is folded up under L  Lower extremity:  No LE edema.  Limited foot exam with no ulcerations.  2+ distal pulses. Psychiatric:  grossly normal mood and affect, speech fluent and appropriate, A&O x 3 Neurologic:  CN 2-12 grossly intact, moves all extremities in coordinated fashion other than RLE    Radiological Exams on Admission: Independently reviewed - see discussion in A/P where applicable  DG FEMUR, MIN 2 VIEWS RIGHT  Result Date: 11/06/2022 CLINICAL DATA:  89 year old female with history of trauma from a fall. Leg deformity. EXAM: RIGHT FEMUR 2 VIEWS COMPARISON:  Right hip radiograph 08/23/2022. FINDINGS: Compared to the prior examination there has been interval fracture of the right proximal femur, which appears to involve the intertrochanteric region and the proximal third of the right femoral diaphysis. This fracture is displaced and comminuted, with proximally 2 cm of medial displacement of the femoral diaphysis relative to the proximal fracture fragment. Three cannulated fixation screws are again noted in the right femoral neck. Old healed fractures of the right superior and inferior pubic rami are again noted. IMPRESSION: 1. Status post ORIF in the right femoral neck with interval development of a displaced comminuted fracture involving both the intertrochanteric regions in the  proximal third of the right femoral diaphysis, as above. Electronically Signed   By: Trudie Reed M.D.   On: 11/06/2022 06:30   DG Chest Port 1 View  Result Date: 11/06/2022 CLINICAL DATA:  89 year old female with history of chest pain and shortness of breath. EXAM: PORTABLE CHEST 1 VIEW COMPARISON:  Chest x-ray 10/27/2022. FINDINGS: Lung volumes are slightly low. No acute consolidative airspace disease. No pleural effusions. No pneumothorax. No evidence of pulmonary edema. Heart size is borderline enlarged. Upper mediastinal contours are within normal limits. Atherosclerotic calcifications are noted in the thoracic aorta. Left-sided pacemaker device in place with lead tips projecting over the expected location of the right atrium and right ventricle. IMPRESSION: 1. No radiographic evidence of acute cardiopulmonary disease. 2. Aortic atherosclerosis. Electronically Signed   By: Trudie Reed M.D.   On: 11/06/2022 06:28   DG Knee Right Port  Result Date: 11/06/2022 CLINICAL DATA:  89 year old female with history of fall. Legs deformity. EXAM: PORTABLE RIGHT KNEE - 1-2 VIEW COMPARISON:  No priors. FINDINGS: Single lateral view of the right knee demonstrates no definite acute displaced fracture. Degenerative changes of osteoarthritis are noted. IMPRESSION: 1. Limited lateral view of the right knee demonstrates no definite acute posttraumatic findings. Electronically Signed   By: Trudie Reed M.D.   On: 11/06/2022 06:26    EKG: Independently reviewed.  NSR with rate 82; prolonged QTc 575; atrial-paced, ventricular sensed   Labs on Admission: I have personally reviewed the available labs and imaging studies at the time of the admission.  Pertinent labs:    Glucose 144 BUN 21/Creatinine 1.21/GFR 41 - stable WBC 11.6 Hgb 9.5   Assessment and Plan: Active Problems:   Paroxysmal atrial fibrillation (HCC)   Essential hypertension   Hyperlipidemia   Chronic combined systolic (congestive)  and diastolic (congestive) heart failure (HCC)   Pacemaker   Closed right hip fracture, initial encounter (HCC)   Chronic kidney disease, stage 3a (HCC)   Chronic cough   DNR (do not resuscitate)    Complicated Hip fracture -Apparently mechanical fall resulting in hip fracture -Imaging: s/p ORIF in the right femoral neck with interval development of a displaced comminuted fracture involving both the intertrochanteric regions in the proximal third of the  right femoral diaphysis -Orthopedics consulted -NPO in anticipation of surgical repair  -SCDs overnight, start Lovenox post-operatively (or as per ortho) -Pain control with Tylenol, Robaxin, Oxycodone, and Dilaudid prn -TOC team consult for rehab placement -Will need PT consult post-operatively -Hip fracture order set utilized -TXA per orthopedics -Fascia iliacus block ordered per anesthesia  Pre-operative stratification -Orthopedic/spinal surgery is associated with an intermediate (1-5%) cardiovascular risk for cardiac death and nonfatal MI -With her h/o CHF, her revised cardiac index gives a risk estimate of 6% -Because of this risk, she is recommended to have pre-operative EKG testing prior to surgery; this was done in the ER -Her Detsky's Modified Cardiac Risk Index score is Class I, with a low cardiac risk -It is reasonable for her to go to the OR without additional evaluation  Chronic combined heart failure -4/21 Echocardiogram with reduced LV systolic function 35 to 40%, global hypokinesis, no significant valvular disease, grade 1 diastolic dysfunction.  -Recently admitted for CHF exacerbation but no further pulmonary edema evident since last hospitalization >1 week ago -Appears compensated at this time -Hold Jardiance -Resume spironolactone tomorrow  -Continue metoprolol, Imdur, ASA  -Hold bumetanide for now   Paroxysmal atrial fibrillation -Continue rate control with Toprol XL, pacemaker -Does not appear to be taking  Multaq at this time -Apixaban currently on hold due to significant episode of gastrointestinal bleeding; patient will follow up with GI as outpatient.    Essential hypertension -Continue Toprol XL   Hyperlipidemia -Continue pravastatin  Stage 3a/b CKD -Appears to be stable at this time, vacillates around 3a/b at baseline -Attempt to avoid nephrotoxic medications -Recheck BMP in AM    Chronic cough -Seen by pulm on 4/26 -CT with air trapping and minimal scarring, PFTs with air trapping -Wixela resumed (Dulera per formulary while here) -Also on flutter valve 3x/day -On 1L O2 qhs  DNR -I have discussed code status with the patient and her son and they are in agreement that the patient would not desire resuscitation and would prefer to die a natural death should that situation arise.    Advance Care Planning:   Code Status: DNR   Consults: Orthopedics; SW, Nutrition; will need PT post-operatively   DVT Prophylaxis: SCDs until approved for Lovenox by orthopedics  Family Communication: Son was present for the majority of the evaluation  Severity of Illness: The appropriate patient status for this patient is INPATIENT. Inpatient status is judged to be reasonable and necessary in order to provide the required intensity of service to ensure the patient's safety. The patient's presenting symptoms, physical exam findings, and initial radiographic and laboratory data in the context of their chronic comorbidities is felt to place them at high risk for further clinical deterioration. Furthermore, it is not anticipated that the patient will be medically stable for discharge from the hospital within 2 midnights of admission.   * I certify that at the point of admission it is my clinical judgment that the patient will require inpatient hospital care spanning beyond 2 midnights from the point of admission due to high intensity of service, high risk for further deterioration and high frequency of  surveillance required.*  Author: Jonah Blue, MD 11/06/2022 9:54 AM  For on call review [email or web address].

## 2022-11-06 NOTE — Transfer of Care (Signed)
Immediate Anesthesia Transfer of Care Note  Patient: Dana Morrison  Procedure(s) Performed: HIP NAILING  AND REMOVAL OF HARDWARE (Right)  Patient Location: PACU  Anesthesia Type:Spinal  Level of Consciousness: awake, oriented, and patient cooperative  Airway & Oxygen Therapy: Patient Spontanous Breathing and Patient connected to nasal cannula oxygen  Post-op Assessment: Report given to RN and Post -op Vital signs reviewed and stable  Post vital signs: Reviewed and stable  Last Vitals:  Vitals Value Taken Time  BP 105/67 11/06/22 1347  Temp    Pulse 93 11/06/22 1352  Resp 24 11/06/22 1352  SpO2 97 % 11/06/22 1352  Vitals shown include unvalidated device data.  Last Pain:  Vitals:   11/06/22 0955  TempSrc: Oral  PainSc: 8          Complications: No notable events documented.

## 2022-11-06 NOTE — Anesthesia Preprocedure Evaluation (Addendum)
Anesthesia Evaluation  Patient identified by MRN, date of birth, ID band Patient awake    Reviewed: Allergy & Precautions, NPO status , Patient's Chart, lab work & pertinent test results, reviewed documented beta blocker date and time   Airway Mallampati: II       Dental no notable dental hx. (+) Teeth Intact   Pulmonary former smoker   Pulmonary exam normal breath sounds clear to auscultation       Cardiovascular hypertension, Pt. on medications and Pt. on home beta blockers + CAD and +CHF  Normal cardiovascular exam+ dysrhythmias Atrial Fibrillation + pacemaker  Rhythm:Regular Rate:Normal  Echo 10/28/22 1. Left ventricular ejection fraction, by estimation, is 35 to 40%. The  left ventricle has moderately decreased function. The left ventricle  demonstrates global hypokinesis. Left ventricular diastolic parameters are  consistent with Grade I diastolic  dysfunction (impaired relaxation).   2. Right ventricular systolic function is normal. The right ventricular  size is mildly enlarged. There is moderately elevated pulmonary artery  systolic pressure. The estimated right ventricular systolic pressure is  53.4 mmHg.   3. Right atrial size was mildly dilated.   4. The mitral valve is grossly normal. Mild mitral valve regurgitation.  No evidence of mitral stenosis.   5. The aortic valve was not well visualized. Aortic valve regurgitation  is not visualized. No aortic stenosis is present.   6. The inferior vena cava is dilated in size with <50% respiratory  variability, suggesting right atrial pressure of 15 mmHg.    EKG 11/06/22 Atrial sensed, ventricular paced rhythm   Neuro/Psych negative neurological ROS  negative psych ROS   GI/Hepatic negative GI ROS, Neg liver ROS,,,  Endo/Other  Hyperlipidemia  Renal/GU Renal InsufficiencyRenal disease  negative genitourinary   Musculoskeletal  (+) Arthritis , Osteoarthritis,   Proximal right femur Fx Intertrochanteric right femur Fx   Abdominal   Peds  Hematology  (+) Blood dyscrasia, anemia   Anesthesia Other Findings   Reproductive/Obstetrics                              Anesthesia Physical Anesthesia Plan  ASA: 3 and emergent  Anesthesia Plan: Spinal   Post-op Pain Management: Minimal or no pain anticipated   Induction: Intravenous  PONV Risk Score and Plan: 3 and Treatment may vary due to age or medical condition and Propofol infusion  Airway Management Planned: Natural Airway and Nasal Cannula  Additional Equipment: None  Intra-op Plan:   Post-operative Plan:   Informed Consent: I have reviewed the patients History and Physical, chart, labs and discussed the procedure including the risks, benefits and alternatives for the proposed anesthesia with the patient or authorized representative who has indicated his/her understanding and acceptance.   Patient has DNR.  Discussed DNR with patient, Discussed DNR with power of attorney and Suspend DNR.   Dental advisory given  Plan Discussed with: CRNA and Anesthesiologist  Anesthesia Plan Comments:         Anesthesia Quick Evaluation

## 2022-11-06 NOTE — Plan of Care (Signed)
  Problem: Health Behavior/Discharge Planning: Goal: Ability to manage health-related needs will improve Outcome: Progressing   

## 2022-11-06 NOTE — ED Notes (Signed)
Patient son arrived to bedside speaking with pharmacist

## 2022-11-06 NOTE — ED Provider Notes (Signed)
[place] EMERGENCY DEPARTMENT AT [facility] Provider Note   CSN: [id number] Arrival date & time: 11/06/22  4098     History  Chief Complaint  Patient presents with   Fall    Pt arrived via ems from [facility] after falling out of bed has pain and swelling to right femur    Dana Morrison is a 89 y.o. female.  The history is provided by the patient.   Patient with extensive history including atrial fibrillation, CHF, CAD presents after fall.  Patient fell out of bed landing on her right leg.  No head injury or LOC.  No headache or neck pain.  She reports all the pain is in her right hip.  She is not on anticoagulation    Past Medical History:  Diagnosis Date   Atrial fibrillation (HCC)    Chronic diastolic heart failure (HCC) 09/12/2017   Congestive heart failure (CHF) (HCC)    estimated ejection fraction is 54%; diastolic   Coronary artery disease    Decreased vision 09/12/2017   Hx of myocardial infarction 2010   Hypertension    Osteoarthritis of hip 09/12/2017   Osteoarthritis of knee 09/12/2017   Paroxysmal atrial fibrillation (HCC) 09/12/2017   Scalp psoriasis 09/12/2017   Seasonal allergic rhinitis due to pollen 09/12/2017   Urticaria 09/12/2017   Venous insufficiency 09/12/2017    Home Medications Prior to Admission medications   Medication Sig Start Date End Date Taking? Authorizing Provider  acetaminophen (TYLENOL) 325 MG tablet Take 2 tablets (650 mg total) by mouth every 8 (eight) hours as needed for moderate pain or mild pain. 08/24/22   Montez Morita, PA-C  aspirin 81 MG chewable tablet Chew 1 tablet by mouth daily. 05/19/17   [provider]  bumetanide (BUMEX) 1 MG tablet Take 1 tablet (1 mg total) by mouth daily. Please take twice daily in case of weight gain 2 to 3 lbs in 24 hrs or 5 lbs in 7 days. 10/30/22   Arrien, York Ram, MD  calcium-vitamin D (OSCAL WITH D) 500-5 MG-MCG tablet Take 1 tablet by mouth daily with  breakfast.    [provider]  empagliflozin (JARDIANCE) 10 MG TABS tablet Take 1 tablet (10 mg total) by mouth daily. 10/30/22 11/29/22  Arrien, York Ram, MD  ferrous sulfate 325 (65 FE) MG tablet Take 325 mg by mouth daily with breakfast.    [provider]  fluticasone-salmeterol (WIXELA INHUB) 100-50 MCG/ACT AEPB Inhale 1 puff into the lungs 2 (two) times daily. 08/03/22   Luciano Cutter, MD  guaiFENesin-dextromethorphan (ROBITUSSIN DM) 100-10 MG/5ML syrup Take 10 mLs by mouth every 6 (six) hours as needed for cough. Patient not taking: Reported on 10/27/2022 08/28/22   Dorcas Carrow, MD  isosorbide mononitrate (IMDUR) 30 MG 24 hr tablet TAKE 1 TABLET EVERY DAY Patient taking differently: Take 30 mg by mouth daily. 05/02/22   Wendall Stade, MD  metoprolol succinate (TOPROL-XL) 25 MG 24 hr tablet TAKE 1 TABLET EVERY DAY Patient taking differently: Take 25 mg by mouth daily. 05/02/22   Wendall Stade, MD  MULTAQ 400 MG tablet TAKE 1 TABLET TWICE DAILY WITH MEALS 02/23/22   Wendall Stade, MD  Multiple Vitamin (MULTIVITAMIN) capsule Take 1 capsule by mouth daily.    [provider]  nitroGLYCERIN (NITROSTAT) 0.4 MG SL tablet Place 0.4 mg under the tongue every 5 (five) minutes as needed for chest pain.    [provider]  pantoprazole (  PROTONIX) 40 MG tablet Take 1 tablet (40 mg total) by mouth daily. 08/28/22   Dorcas Carrow, MD  pravastatin (PRAVACHOL) 20 MG tablet TAKE 1 TABLET EVERY DAY (NEED MD APPOINTMENT) Patient taking differently: Take 20 mg by mouth daily. 03/21/22   Wendall Stade, MD  spironolactone (ALDACTONE) 25 MG tablet Take 1/2 tablet (12.5 mg total) by mouth daily. 10/30/22 11/29/22  Arrien, York Ram, MD      Allergies    Psyllium and Other    Review of Systems   Review of Systems  Cardiovascular:  Negative for chest pain.  Musculoskeletal:  Positive for arthralgias.    Physical Exam Updated Vital Signs BP (!) 124/57    Pulse 79   Temp 97.8 F (36.6 C) (Oral)   Resp (!) 27   Ht 1.702 m (5\' 7" )   Wt 80.7 kg   SpO2 97%   BMI 27.88 kg/m  Physical Exam CONSTITUTIONAL: Elderly, ill-appearing HEAD: Normocephalic/atraumatic EYES: EOMI ENMT: Mucous membranes moist NECK: supple no meningeal signs SPINE/BACK:entire spine nontender No bruising/crepitance/stepoffs noted to spine CV: No loud harsh murmurs LUNGS: Lungs are clear to auscultation bilaterally, tachypneic ABDOMEN: soft, nontender NEURO: Pt is awake/alert/appropriate EXTREMITIES: Swelling, tenderness and obvious deformity to right hip and thigh.  Distal pulses equal/intact in both feet.  No tenderness noted to right foot or ankle. No tenderness of the range of motion of left hip All other extremities/joints palpated/ranged and nontender SKIN: warm, color normal PSYCH: Anxious ED Results / Procedures / Treatments   Labs (all labs ordered are listed, but only abnormal results are displayed) Labs Reviewed  BASIC METABOLIC PANEL - Abnormal; Notable for the following components:      Result Value   Glucose, Bld 144 (*)    Creatinine, Ser 1.21 (*)    Calcium 8.7 (*)    GFR, Estimated 41 (*)    All other components within normal limits  CBC WITH DIFFERENTIAL/PLATELET - Abnormal; Notable for the following components:   WBC 11.6 (*)    RBC 3.66 (*)    Hemoglobin 9.5 (*)    HCT 29.8 (*)    RDW 15.9 (*)    Neutro Abs 8.6 (*)    All other components within normal limits  PROTIME-INR  TYPE AND SCREEN  ABO/RH    EKG EKG Interpretation  Date/Time:  Tuesday November 06 2022 05:18:54 EDT Ventricular Rate:  82 PR Interval:  210 QRS Duration: 178 QT Interval:  492 QTC Calculation: 575 R Axis:   -62 Text Interpretation: Atrial-sensed ventricular-paced rhythm No further analysis attempted due to paced rhythm Confirmed by Zadie Rhine ([id number]) on 11/06/2022 5:30:25 AM  Radiology DG FEMUR, MIN 2 VIEWS RIGHT  Result Date: 11/06/2022 CLINICAL  DATA:  89 year old female with history of trauma from a fall. Leg deformity. EXAM: RIGHT FEMUR 2 VIEWS COMPARISON:  Right hip radiograph 08/23/2022. FINDINGS: Compared to the prior examination there has been interval fracture of the right proximal femur, which appears to involve the intertrochanteric region and the proximal third of the right femoral diaphysis. This fracture is displaced and comminuted, with proximally 2 cm of medial displacement of the femoral diaphysis relative to the proximal fracture fragment. Three cannulated fixation screws are again noted in the right femoral neck. Old healed fractures of the right superior and inferior pubic rami are again noted. IMPRESSION: 1. Status post ORIF in the right femoral neck with interval development of a displaced comminuted fracture involving both the intertrochanteric regions in the proximal third of the  right femoral diaphysis, as above. Electronically Signed   By: Trudie Reed M.D.   On: 11/06/2022 06:30   DG Chest Port 1 View  Result Date: 11/06/2022 CLINICAL DATA:  89 year old female with history of chest pain and shortness of breath. EXAM: PORTABLE CHEST 1 VIEW COMPARISON:  Chest x-ray 10/27/2022. FINDINGS: Lung volumes are slightly low. No acute consolidative airspace disease. No pleural effusions. No pneumothorax. No evidence of pulmonary edema. Heart size is borderline enlarged. Upper mediastinal contours are within normal limits. Atherosclerotic calcifications are noted in the thoracic aorta. Left-sided pacemaker device in place with lead tips projecting over the expected location of the right atrium and right ventricle. IMPRESSION: 1. No radiographic evidence of acute cardiopulmonary disease. 2. Aortic atherosclerosis. Electronically Signed   By: Trudie Reed M.D.   On: 11/06/2022 06:28   DG Knee Right Port  Result Date: 11/06/2022 CLINICAL DATA:  89 year old female with history of fall. Legs deformity. EXAM: PORTABLE RIGHT KNEE - 1-2  VIEW COMPARISON:  No priors. FINDINGS: Single lateral view of the right knee demonstrates no definite acute displaced fracture. Degenerative changes of osteoarthritis are noted. IMPRESSION: 1. Limited lateral view of the right knee demonstrates no definite acute posttraumatic findings. Electronically Signed   By: Trudie Reed M.D.   On: 11/06/2022 06:26    Procedures Procedures    Medications Ordered in ED Medications  fentaNYL (SUBLIMAZE) injection 100 mcg (100 mcg Intravenous Given 11/06/22 0547)  fentaNYL (SUBLIMAZE) injection 50 mcg (has no administration in time range)  ondansetron (ZOFRAN) injection 4 mg (4 mg Intravenous Given 11/06/22 0547)    ED Course/ Medical Decision Making/ A&P Clinical Course as of 11/06/22 4403  Tue Nov 06, 2022  0604 Patient found to have right femur fracture after recent fixation.  Discussed the case with Dr. Jena Gauss on-call for Dr. Carola Frost.  He will review imaging and have the day team evaluate the patient.  Plan to keep patient n.p.o. [DW]  0612 Patient updated on plan.  She appears more comfortable.  She had to call her son Gala Romney via phone and informed him of the injury [DW]  0612 WBC(!): 11.6 Leukocytosis [DW]  0612 Hemoglobin(!): 9.5 Anemia [DW]  0624 Creatinine(!): 1.21 Renal insufficiency [DW]  0632 Discussed with Dr. Imogene Burn for admission [DW]    Clinical Course User Index [DW] Zadie Rhine, MD                             Medical Decision Making Amount and/or Complexity of Data Reviewed Labs: ordered. Decision-making details documented in ED Course. Radiology: ordered.  Risk Prescription drug management. Decision regarding hospitalization.   This patient presents to the ED for concern of right hip pain, this involves an extensive number of treatment options, and is a complaint that carries with it a high risk of complications and morbidity.  The differential diagnosis includes but is not limited to hip strain, fracture,  dislocation  Comorbidities that complicate the patient evaluation: Patient's presentation is complicated by their history of atrial fibrillation, CHF  Social Determinants of Health: Patient's  resides in a nursing home   increases the complexity of managing their presentation  Additional history obtained: Records reviewed previous admission documents recent right femoral neck fracture repair by screw fixation by Dr. Carola Frost  Lab Tests: I Ordered, and personally interpreted labs.  The pertinent results include: Hyperglycemia, leukocytosis  Imaging Studies ordered: I ordered imaging studies including X-ray chest, right lower extremity  I independently visualized and interpreted imaging which showed femur fracture I agree with the radiologist interpretation   Medicines ordered and prescription drug management: I ordered medication including fentanyl for pain Reevaluation of the patient after these medicines showed that the patient    improved   Critical Interventions:   admission for operative management  Consultations Obtained: I requested consultation with the admitting physician Dr. Imogene Burn and radiologist Dr. Jena Gauss , and discussed  findings as well as pertinent plan - they recommend: Admit to hospitalist, likely operative management  Reevaluation: After the interventions noted above, I reevaluated the patient and found that they have :improved  Complexity of problems addressed: Patient's presentation is most consistent with  acute presentation with potential threat to life or bodily function  Disposition: After consideration of the diagnostic results and the patient's response to treatment,  I feel that the patent would benefit from admission   .           Final Clinical Impression(s) / ED Diagnoses Final diagnoses:  Other closed fracture of right femur, unspecified portion of femur, initial encounter [facility])    Rx / DC Orders ED Discharge Orders     None          Zadie Rhine, MD 11/06/22 [phone]

## 2022-11-06 NOTE — Anesthesia Procedure Notes (Signed)
Procedure Name: MAC Date/Time: 11/06/2022 11:00 AM  Performed by: Orlin Hilding, CRNAPre-anesthesia Checklist: Patient identified, Emergency Drugs available, Suction available, Patient being monitored and Timeout performed Patient Re-evaluated:Patient Re-evaluated prior to induction Oxygen Delivery Method: Simple face mask Placement Confirmation: positive ETCO2 and breath sounds checked- equal and bilateral

## 2022-11-07 ENCOUNTER — Encounter (HOSPITAL_COMMUNITY): Payer: Self-pay | Admitting: Orthopedic Surgery

## 2022-11-07 DIAGNOSIS — S72001D Fracture of unspecified part of neck of right femur, subsequent encounter for closed fracture with routine healing: Secondary | ICD-10-CM

## 2022-11-07 LAB — BASIC METABOLIC PANEL
Anion gap: 13 (ref 5–15)
BUN: 27 mg/dL — ABNORMAL HIGH (ref 8–23)
CO2: 22 mmol/L (ref 22–32)
Calcium: 7.9 mg/dL — ABNORMAL LOW (ref 8.9–10.3)
Chloride: 97 mmol/L — ABNORMAL LOW (ref 98–111)
Creatinine, Ser: 1.43 mg/dL — ABNORMAL HIGH (ref 0.44–1.00)
GFR, Estimated: 34 mL/min — ABNORMAL LOW (ref >60)
Glucose, Bld: 149 mg/dL — ABNORMAL HIGH (ref 70–99)
Potassium: 4.1 mmol/L (ref 3.5–5.1)
Sodium: 132 mmol/L — ABNORMAL LOW (ref 135–145)

## 2022-11-07 LAB — CBC
HCT: 31.5 % — ABNORMAL LOW (ref 36.0–46.0)
Hemoglobin: 9.8 g/dL — ABNORMAL LOW (ref 12.0–15.0)
MCH: 25.7 pg — ABNORMAL LOW (ref 26.0–34.0)
MCHC: 31.1 g/dL (ref 30.0–36.0)
MCV: 82.5 fL (ref 80.0–100.0)
Platelets: 186 10*3/uL (ref 150–400)
RBC: 3.82 MIL/uL — ABNORMAL LOW (ref 3.87–5.11)
RDW: 16.2 % — ABNORMAL HIGH (ref 11.5–15.5)
WBC: 6.9 10*3/uL (ref 4.0–10.5)
nRBC: 0 % (ref 0.0–0.2)

## 2022-11-07 MED ORDER — ENSURE ENLIVE PO LIQD
237.0000 mL | Freq: Two times a day (BID) | ORAL | Status: DC
  Administered 2022-11-07 – 2022-11-13 (×10): 237 mL via ORAL

## 2022-11-07 MED ORDER — RENA-VITE PO TABS
1.0000 | ORAL_TABLET | Freq: Every day | ORAL | Status: DC
  Administered 2022-11-07 – 2022-11-12 (×6): 1 via ORAL
  Filled 2022-11-07 (×6): qty 1

## 2022-11-07 MED ORDER — HYDRALAZINE HCL 20 MG/ML IJ SOLN: 10.0000 mg | INTRAMUSCULAR | Status: DC | PRN

## 2022-11-07 MED ORDER — TRAZODONE HCL 50 MG PO TABS
50.0000 mg | ORAL_TABLET | Freq: Every evening | ORAL | Status: DC | PRN
  Administered 2022-11-10 – 2022-11-12 (×2): 50 mg via ORAL
  Filled 2022-11-07 (×3): qty 1

## 2022-11-07 MED ORDER — METOPROLOL TARTRATE 5 MG/5ML IV SOLN: 5.0000 mg | INTRAVENOUS | Status: DC | PRN

## 2022-11-07 MED ORDER — GUAIFENESIN 100 MG/5ML PO LIQD: 5.0000 mL | ORAL | Status: DC | PRN

## 2022-11-07 MED ORDER — ADULT MULTIVITAMIN W/MINERALS CH: 1.0000 | ORAL_TABLET | Freq: Every day | ORAL | Status: DC

## 2022-11-07 MED ORDER — IPRATROPIUM-ALBUTEROL 0.5-2.5 (3) MG/3ML IN SOLN: 3.0000 mL | RESPIRATORY_TRACT | Status: DC | PRN

## 2022-11-07 NOTE — TOC Initial Note (Signed)
Transition of Care [facility]) - Initial/Assessment Note    Patient Details  Name: Dana Morrison MRN: [id number] Date of Birth: 04-02-1924  Transition of Care [facility]) CM/SW Contact:    Lorri Frederick, LCSW Phone Number: 11/07/2022, 2:01 PM  Clinical Narrative:   CSW met with pt regarding DC recommendation for SNF.  Pt agreeable to SNF, medicare choice document provided.  Pt in independent living at [facility] in [place], [facility] aide in place daily x4 hours. Permission given to speak with sons Gala Romney and Brett Canales.  CSW spoke with son Gala Romney by phone.  Also in agreement with plan for SNF.  Pt was at [facility] recently but it did not go well and they do not want to return.  Requesting [facility], [facility], [facility] as first choices. Also requesting that pt can leave whatever SNF she goes to on Saturday for her 89th birthday dinner, which they have been planning.    Referral sent out to SNF, reached out to the 3 identified facilities.                Expected Discharge Plan: Skilled Nursing Facility Barriers to Discharge: Continued Medical Work up, SNF Pending bed offer   Patient Goals and CMS Choice Patient states their goals for this hospitalization and ongoing recovery are:: back to normal CMS Medicare.gov Compare Post Acute Care list provided to:: Patient Choice offered to / list presented to : Patient      Expected Discharge Plan and Services In-house Referral: Clinical Social Work   Post Acute Care Choice: Skilled Nursing Facility Living arrangements for the past 2 months: Independent Living Facility Risk manager)                                      Prior Living Arrangements/Services Living arrangements for the past 2 months: Marketing executive Risk manager) Lives with:: Self Patient language and need for interpreter reviewed:: Yes Do you feel safe going back to the place where you live?: Yes      Need for Family Participation in Patient Care: Yes  (Comment) Care giver support system in place?: Yes (comment) Current home services: Other (comment) (none) Criminal Activity/Legal Involvement Pertinent to Current Situation/Hospitalization: No - Comment as needed  Activities of Daily Living      Permission Sought/Granted Permission sought to share information with : Family Supports Permission granted to share information with : Yes, Verbal Permission Granted  Share Information with NAME: sons Gala Romney and Brett Canales  Permission granted to share info w AGENCY: SNF        Emotional Assessment Appearance:: Appears stated age Attitude/Demeanor/Rapport: Engaged Affect (typically observed): Appropriate Orientation: : Oriented to Self, Oriented to Place, Oriented to  Time, Oriented to Situation      Admission diagnosis:  Hip fracture (HCC) [S72.009A] Other closed fracture of right femur, unspecified portion of femur, initial encounter (HCC) [B28.4X3K] Patient Active Problem List   Diagnosis Date Noted   Chronic kidney disease, stage 3a (HCC) 11/06/2022   Chronic cough 11/06/2022   DNR (do not resuscitate) 11/06/2022   Acute on chronic diastolic heart failure (HCC) 10/27/2022   Acute respiratory failure with hypoxia (HCC) 10/27/2022   Hypokalemia 10/27/2022   Hyperlipidemia 10/27/2022   Anemia of chronic disease 10/27/2022   Closed right hip fracture, initial encounter (HCC) 08/21/2022   Coronary artery disease 08/21/2022   Chronic bronchitis (HCC) 08/21/2022   Sick sinus syndrome (HCC) 09/22/2020  Pacemaker 09/22/2020   Seasonal allergic rhinitis due to pollen 09/12/2017   Paroxysmal atrial fibrillation (HCC) 09/12/2017   Chronic combined systolic (congestive) and diastolic (congestive) heart failure (HCC) 09/12/2017   Decreased vision 09/12/2017   Venous insufficiency 09/12/2017   Scalp psoriasis 09/12/2017   Urticaria 09/12/2017   Osteoarthritis of knee 09/12/2017   Osteoarthritis of hip 09/12/2017   Essential hypertension  09/12/2017   PCP:  Charlane Ferretti, DO Pharmacy:   [facility] - [place], [place] - [address] [place] [address] Phone: [phone] Fax: [phone]  [facility] [phone] - [place], [place] - [address] AT CORNER OF [facility] [address] [place] [address] Phone: [phone] Fax: [phone]  [facility] [address] [place] [address] Phone: [phone] Fax: [phone]  [facility] [address] [place] [address] Phone: [phone] Fax: [phone]     Social Determinants of Health (SDOH) Social History: SDOH Screenings   Food Insecurity: No Food Insecurity (08/22/2022)  Housing: Low Risk  (08/22/2022)  Transportation Needs: Unknown (08/22/2022)  Utilities: Not At Risk (08/22/2022)  Tobacco Use: Medium Risk (11/07/2022)   SDOH Interventions:     Readmission Risk Interventions     No data to display

## 2022-11-07 NOTE — Plan of Care (Signed)
  Problem: Activity: Goal: Risk for activity intolerance will decrease Outcome: Not Progressing   Problem: Nutrition: Goal: Adequate nutrition will be maintained Outcome: Not Progressing   Problem: Pain Managment: Goal: General experience of comfort will improve Outcome: Not Progressing   

## 2022-11-07 NOTE — Progress Notes (Signed)
PROGRESS NOTE    Dana Morrison  WUJ:[id number] DOB: 01-12-1924 DOA: 11/06/2022 PCP: Charlane Ferretti, DO   Brief Narrative:  89 y.o. female with medical history significant of afib on Eliquis which has been on hold due to prior GI bleed, chronic diastolic CHF, CAD, and HTN presenting with a fall.  She was last admitted from 4/20-23 with acute on chronic diastolic CHF and prior admission in 08/2022 for R hip fracture and subsequent 09/2022 admission for GI bleeding.    She reports that she got up overnight and balanced herself on the arm of the chair and then fell to the floor, landing on her R hip.  Upon admission found to have periprosthetic fracture.   Assessment & Plan:  Active Problems:   Paroxysmal atrial fibrillation (HCC)   Essential hypertension   Hyperlipidemia   Chronic combined systolic (congestive) and diastolic (congestive) heart failure (HCC)   Pacemaker   Closed right hip fracture, initial encounter (HCC)   Chronic kidney disease, stage 3a (HCC)   Chronic cough   DNR (do not resuscitate)     Right hip periprosthetic fracture Status post IM nailing of the right femur and removal of deep implant 4/30 Right hip periprosthetic fracture, seen by orthopedic.  Pain control, postop recommendations, pain management and DVT prophylaxis per their service    Chronic combined heart failure, EF 40% with global hypokinesia -4/21 Echocardiogram with reduced LV systolic function 35 to 40%, global hypokinesis, no significant valvular disease, grade 1 diastolic dysfunction.  Overall appears to be compensated at this time.  Continue Toprol-XL, Imdur.  Hold Aldactone   Paroxysmal atrial fibrillation Continue Toprol-XL.  Has pacemaker.  Anticoagulation stopped due to recent GI bleed, follow-up outpatient  AKI on CKD stage 3a Creatinine stable around 1.0.  This morning 1.43.  Hold Aldactone   Essential hypertension -Continue Toprol XL, IV as needed   Hyperlipidemia -Continue  pravastatin     Chronic cough -Seen by pulm on 4/26 -CT with air trapping and minimal scarring, PFTs with air trapping -Bronchodilators, I-S/flutter valve   DNR -I have discussed code status with the patient and her son and they are in agreement that the patient would not desire resuscitation and would prefer to die a natural death should that situation arise.    DVT prophylaxis: Lovenox Code Status: DNR Family Communication:   Status is: Inpatient Postop management per orthopedic Awaiting renal function to improve.  Pending PT/OT       Diet Orders (From admission, onward)     Start     Ordered   11/06/22 1437  Diet regular Room service appropriate? Yes; Fluid consistency: Thin  Diet effective now       Question Answer Comment  Room service appropriate? Yes   Fluid consistency: Thin      11/06/22 1436            Subjective: Seen at bedside, does not have any new complaints. Encouraged oral intake  Examination:  General exam: Appears calm and comfortable, elderly frail Respiratory system: Clear to auscultation. Respiratory effort normal. Cardiovascular system: S1 & S2 heard, RRR. No JVD, murmurs, rubs, gallops or clicks. No pedal edema. Gastrointestinal system: Abdomen is nondistended, soft and nontender. No organomegaly or masses felt. Normal bowel sounds heard. Central nervous system: Alert and oriented. No focal neurological deficits. Extremities: Symmetric 5 x 5 power. Skin: No rashes, lesions or ulcers Psychiatry: Judgement and insight appear normal. Mood & affect appropriate.  Objective: Vitals:   11/06/22 2014 11/07/22 0435  11/07/22 0728 11/07/22 0836  BP: (!) 105/58 (!) 108/45 (!) 103/52   Pulse: 90 96 92 98  Resp: 20 20  18   Temp: 97.8 F (36.6 C) 97.9 F (36.6 C) 97.8 F (36.6 C)   TempSrc: Oral Axillary Oral   SpO2: 100% 97% 98% 100%  Weight:      Height:        Intake/Output Summary (Last 24 hours) at 11/07/2022 0845 Last data filed at  11/07/2022 0631 Gross per 24 hour  Intake 200 ml  Output 1250 ml  Net -1050 ml   Filed Weights   11/06/22 0524  Weight: 80.7 kg    Scheduled Meds:  aspirin  81 mg Oral Daily   docusate sodium  100 mg Oral BID   enoxaparin (LOVENOX) injection  30 mg Subcutaneous Q24H   isosorbide mononitrate  30 mg Oral Daily   metoprolol succinate  25 mg Oral Daily   mometasone-formoterol  2 puff Inhalation BID   pantoprazole  40 mg Oral Daily   pravastatin  20 mg Oral Daily   spironolactone  12.5 mg Oral Daily   Continuous Infusions:  methocarbamol (ROBAXIN) IV      Nutritional status     Body mass index is 27.88 kg/m.  Data Reviewed:   CBC: Recent Labs  Lab 11/06/22 0535 11/07/22 0141  WBC 11.6* 6.9  NEUTROABS 8.6*  --   HGB 9.5* 9.8*  HCT 29.8* 31.5*  MCV 81.4 82.5  PLT 279 186   Basic Metabolic Panel: Recent Labs  Lab 11/06/22 0535 11/07/22 0141  NA 140 132*  K 3.8 4.1  CL 104 97*  CO2 22 22  GLUCOSE 144* 149*  BUN 21 27*  CREATININE 1.21* 1.43*  CALCIUM 8.7* 7.9*   GFR: Estimated Creatinine Clearance: 25.1 mL/min (A) (by C-G formula based on SCr of 1.43 mg/dL (H)). Liver Function Tests: No results for input(s): "AST", "ALT", "ALKPHOS", "BILITOT", "PROT", "ALBUMIN" in the last 168 hours. No results for input(s): "LIPASE", "AMYLASE" in the last 168 hours. No results for input(s): "AMMONIA" in the last 168 hours. Coagulation Profile: Recent Labs  Lab 11/06/22 0535  INR 1.1   Cardiac Enzymes: No results for input(s): "CKTOTAL", "CKMB", "CKMBINDEX", "TROPONINI" in the last 168 hours. BNP (last 3 results) No results for input(s): "PROBNP" in the last 8760 hours. HbA1C: No results for input(s): "HGBA1C" in the last 72 hours. CBG: No results for input(s): "GLUCAP" in the last 168 hours. Lipid Profile: No results for input(s): "CHOL", "HDL", "LDLCALC", "TRIG", "CHOLHDL", "LDLDIRECT" in the last 72 hours. Thyroid Function Tests: No results for input(s):  "TSH", "T4TOTAL", "FREET4", "T3FREE", "THYROIDAB" in the last 72 hours. Anemia Panel: No results for input(s): "VITAMINB12", "FOLATE", "FERRITIN", "TIBC", "IRON", "RETICCTPCT" in the last 72 hours. Sepsis Labs: No results for input(s): "PROCALCITON", "LATICACIDVEN" in the last 168 hours.  Recent Results (from the past 240 hour(s))  Surgical pcr screen     Status: None   Collection Time: 11/06/22 10:01 AM   Specimen: Nasal Mucosa; Nasal Swab  Result Value Ref Range Status   MRSA, PCR NEGATIVE NEGATIVE Final   Staphylococcus aureus NEGATIVE NEGATIVE Final    Comment: (NOTE) The Xpert SA Assay (FDA approved for NASAL specimens in patients 38 years of age and older), is one component of a comprehensive surveillance program. It is not intended to diagnose infection nor to guide or monitor treatment. Performed at [facility], [address] [place], [place] [address]  Radiology Studies: DG FEMUR PORT, MIN 2 VIEWS RIGHT  Result Date: 11/06/2022 CLINICAL DATA:  [id number] Fracture [phone] EXAM: RIGHT FEMUR PORTABLE 2 VIEW COMPARISON:  Earlier films of the same day FINDINGS: Interval IM rod and sliding screw fixation with 2 distal interlocking screws across proximal femoral shaft fracture, fragments in near anatomic alignment. Left knee arthroplasty hardware partially visualized. Stable fracture deformities of right ischiopubic rami. IMPRESSION: Interval ORIF of right femoral shaft fracture, fragments in near anatomic alignment. Electronically Signed   By: Corlis Leak M.D.   On: 11/06/2022 15:10   Pelvis Portable  Result Date: 11/06/2022 CLINICAL DATA:  Fracture EXAM: PORTABLE PELVIS 1-2 VIEWS COMPARISON:  Right femur x-ray 11/06/2022. Right hip x-ray 08/23/2022 FINDINGS: There is a new right femoral intramedullary nail and hip screw present fixating proximal femoral fracture. Alignment is anatomic. Left hip arthroplasty is also in anatomic alignment. There are healed right superior  and inferior pubic rami fractures. There are moderate degenerative changes of the right hip. IMPRESSION: 1. Right femoral intramedullary nail and hip screw fixating proximal femoral fracture in anatomic alignment. Electronically Signed   By: Darliss Cheney M.D.   On: 11/06/2022 15:09   DG FEMUR, MIN 2 VIEWS RIGHT  Result Date: 11/06/2022 CLINICAL DATA:  Fixation of intertrochanteric and subtrochanteric femur fracture. EXAM: RIGHT FEMUR 2 VIEWS COMPARISON:  Radiographs 11/06/2022 FINDINGS: Multiple fluoroscopic spot images demonstrate interval removal of the cannulated hip screws and placement of a long intramedullary gamma nail and a proximal dynamic hip screw with 2 distal interlocking screws. Good position and alignment of the femur fracture without complicating features. IMPRESSION: Good position and alignment of the femur fracture with internal fixation hardware without complicating features. Electronically Signed   By: Rudie Meyer M.D.   On: 11/06/2022 13:13   DG C-Arm 1-60 Min-No Report  Result Date: 11/06/2022 Fluoroscopy was utilized by the requesting physician.  No radiographic interpretation.   DG C-Arm 1-60 Min-No Report  Result Date: 11/06/2022 Fluoroscopy was utilized by the requesting physician.  No radiographic interpretation.   DG FEMUR, MIN 2 VIEWS RIGHT  Result Date: 11/06/2022 CLINICAL DATA:  89 year old female with history of trauma from a fall. Leg deformity. EXAM: RIGHT FEMUR 2 VIEWS COMPARISON:  Right hip radiograph 08/23/2022. FINDINGS: Compared to the prior examination there has been interval fracture of the right proximal femur, which appears to involve the intertrochanteric region and the proximal third of the right femoral diaphysis. This fracture is displaced and comminuted, with proximally 2 cm of medial displacement of the femoral diaphysis relative to the proximal fracture fragment. Three cannulated fixation screws are again noted in the right femoral neck. Old healed  fractures of the right superior and inferior pubic rami are again noted. IMPRESSION: 1. Status post ORIF in the right femoral neck with interval development of a displaced comminuted fracture involving both the intertrochanteric regions in the proximal third of the right femoral diaphysis, as above. Electronically Signed   By: Trudie Reed M.D.   On: 11/06/2022 06:30   DG Chest Port 1 View  Result Date: 11/06/2022 CLINICAL DATA:  89 year old female with history of chest pain and shortness of breath. EXAM: PORTABLE CHEST 1 VIEW COMPARISON:  Chest x-ray 10/27/2022. FINDINGS: Lung volumes are slightly low. No acute consolidative airspace disease. No pleural effusions. No pneumothorax. No evidence of pulmonary edema. Heart size is borderline enlarged. Upper mediastinal contours are within normal limits. Atherosclerotic calcifications are noted in the thoracic aorta. Left-sided pacemaker device in place with lead tips  projecting over the expected location of the right atrium and right ventricle. IMPRESSION: 1. No radiographic evidence of acute cardiopulmonary disease. 2. Aortic atherosclerosis. Electronically Signed   By: Trudie Reed M.D.   On: 11/06/2022 06:28   DG Knee Right Port  Result Date: 11/06/2022 CLINICAL DATA:  89 year old female with history of fall. Legs deformity. EXAM: PORTABLE RIGHT KNEE - 1-2 VIEW COMPARISON:  No priors. FINDINGS: Single lateral view of the right knee demonstrates no definite acute displaced fracture. Degenerative changes of osteoarthritis are noted. IMPRESSION: 1. Limited lateral view of the right knee demonstrates no definite acute posttraumatic findings. Electronically Signed   By: Trudie Reed M.D.   On: 11/06/2022 06:26           LOS: 1 day   Time spent= 35 mins    Brynlei Klausner Joline Maxcy, MD [facility]  If 7PM-7AM, please contact night-coverage  11/07/2022, 8:45 AM

## 2022-11-07 NOTE — Evaluation (Signed)
Occupational Therapy Evaluation Patient Details Name: Dana Morrison MRN: [id number] DOB: 1923/10/03 Today's Date: 11/07/2022   History of Present Illness Pt is a 89 y.o. female presenting 4/30 after a fall at her ILF with resulting R hip pain. Now s/p intramedullary nailing R femur and removal of deep implant. Recent admission for R femoral neck fracture and ORIF for management. PMHx:  osteoporosis, HOH, a-fib, CHFN CAD, low vision, MI HTN, OA hip and knee, urticaria, venous insufficiency   Clinical Impression   PTA, pt recently back at ILF where she reports being independent in ADL with rollator. Upon eval, pt with R hip pain affecting attention, problem solving, and safety during ADL and transfers. Pt performing bed mobility with max A and lateral scoot transfer with min A +2. Pt aware of weight bearing status. Pt with decreased activity tolerance, balance and strength. Patient will benefit from continued inpatient follow up therapy, <3 hours/day and will continue to follow acutely.      Recommendations for follow up therapy are one component of a multi-disciplinary discharge planning process, led by the attending physician.  Recommendations may be updated based on patient status, additional functional criteria and insurance authorization.   Assistance Recommended at Discharge Frequent or constant Supervision/Assistance  Patient can return home with the following Two people to help with walking and/or transfers;A lot of help with bathing/dressing/bathroom;Assistance with cooking/housework;Assist for transportation;Help with stairs or ramp for entrance    Functional Status Assessment  Patient has had a recent decline in their functional status and demonstrates the ability to make significant improvements in function in a reasonable and predictable amount of time.  Equipment Recommendations  Other (comment) (defer)    Recommendations for Other Services       Precautions / Restrictions  Precautions Precautions: Fall Restrictions Weight Bearing Restrictions: Yes RLE Weight Bearing: Touchdown weight bearing      Mobility Bed Mobility Overal bed mobility: Needs Assistance Bed Mobility: Supine to Sit     Supine to sit: Max assist, +2 for physical assistance     General bed mobility comments: HOB elevated to ~20 degrees. Pt requiring max assist to bring BLE toward EOB and calling out in pain throughout. Cues and guidance to elevate trunk    Transfers Overall transfer level: Needs assistance   Transfers: Bed to chair/wheelchair/BSC            Lateral/Scoot Transfers: Mod assist, +2 physical assistance, +2 safety/equipment General transfer comment: Min A +2 for safety during lateral scoot to recliner. Pt with good initiation, but neediung cues for body mechanics when first transitioning onto recliner die to discomfort "lean forward".      Balance Overall balance assessment: Needs assistance Sitting-balance support: Bilateral upper extremity supported, Feet supported Sitting balance-Leahy Scale: Poor Sitting balance - Comments: Frequent reliance on BUE support                                   ADL either performed or assessed with clinical judgement   ADL Overall ADL's : Needs assistance/impaired Eating/Feeding: Supervision/ safety;Sitting Eating/Feeding Details (indicate cue type and reason): supported sitting in recliner with BLE up Grooming: Set up;Sitting Grooming Details (indicate cue type and reason): supported sititng in recliner with BLE up Upper Body Bathing: Sitting;Min guard;Minimal assistance   Lower Body Bathing: Maximal assistance;Sitting/lateral leans   Upper Body Dressing : Min guard;Minimal assistance;Sitting   Lower Body Dressing: Maximal assistance;Sitting/lateral leans Lower Body Dressing  Details (indicate cue type and reason): Pt familiar with AE, however, unable to use this session due to pain Toilet Transfer:  Minimal assistance;+2 for physical assistance;+2 for safety/equipment Toilet Transfer Details (indicate cue type and reason): lateral scoot to recliner Toileting- Clothing Manipulation and Hygiene: Maximal assistance       Functional mobility during ADLs: Minimal assistance;+2 for physical assistance;+2 for safety/equipment (lateral scooting) General ADL Comments: Focus session on mobility and transfers     Vision Patient Visual Report: No change from baseline Vision Assessment?: No apparent visual deficits Additional Comments: WFL for tasks assessed this session     Perception Perception Perception Tested?: No   Praxis Praxis Praxis tested?: Within functional limits    Pertinent Vitals/Pain Pain Assessment Pain Assessment: Faces Faces Pain Scale: Hurts whole lot Pain Location: R hip with mobility Pain Descriptors / Indicators: Aching, Sore, Operative site guarding Pain Intervention(s): Limited activity within patient's tolerance, Monitored during session, Repositioned, Utilized relaxation techniques (required heavy encouragement for slowing her breathing and breathing in through her nose/out through her mouth)     Hand Dominance Right   Extremity/Trunk Assessment Upper Extremity Assessment Upper Extremity Assessment: Overall WFL for tasks assessed   Lower Extremity Assessment Lower Extremity Assessment: Defer to PT evaluation   Cervical / Trunk Assessment Cervical / Trunk Assessment: Kyphotic   Communication Communication Communication: HOH   Cognition Arousal/Alertness: Awake/alert Behavior During Therapy: WFL for tasks assessed/performed Overall Cognitive Status: Impaired/Different from baseline Area of Impairment: Problem solving, Following commands, Attention, Safety/judgement                   Current Attention Level: Sustained   Following Commands: Follows one step commands consistently, Follows one step commands with increased  time Safety/Judgement: Decreased awareness of safety   Problem Solving: Slow processing, Requires verbal cues General Comments: Pt highly distracted by her pain and requiring frequent cueing to engage in deep breathing for pain management as well as pt with tachypnea. Pt quickly losing attention to task of deep breathing, requiring redirection back to task throughout. Pt requiring cues for safety and problem solving during lateral scoot transfer with onself of pain; likely near baseline when pain controlled. Additionally reporting her son is bringing her 78 siblings in for her birthday, however, later reporting she only has two siblings     General Comments  Pt with labored and incresed pace of breathing throughout session. Pt reports labored breathing at baseline, but observed to incresase rate while performing mobility. Pt reporting dizziness EOB 104/41 (63) but pleasant and conversational; lateral scoot to chair and pt with decreased ability to understand verbal language as well as decreased meaningful verbal response 79/47 (58). BLE elevated and reclined in chair and return to 108/46 (64) and pt with improved arousal    Exercises     Shoulder Instructions      Home Living Family/patient expects to be discharged to:: Private residence (ILF) Living Arrangements: Alone Available Help at Discharge: Available PRN/intermittently;Home health;Neighbor Type of Home: Independent living facility Home Access: Level entry     Home Layout: One level     Bathroom Shower/Tub: Producer, television/film/video: Handicapped height Bathroom Accessibility: Yes How Accessible: Accessible via wheelchair;Accessible via walker Home Equipment: Rollator (4 wheels);Shower seat - built in;Grab bars - toilet;Grab bars - tub/shower;Wheelchair - manual;Other (comment) (lift bed)   Additional Comments: Pt was independent with  Rollator in [place] at [employer] [facility]      Prior Functioning/Environment Prior Level of  Function :  Independent/Modified Independent;History of Falls (last six months)             Mobility Comments: rollator with no assist in IL situation ADLs Comments: Pt reports independence since last admission        OT Problem List: Decreased strength;Decreased activity tolerance;Impaired balance (sitting and/or standing);Decreased cognition;Decreased safety awareness;Decreased knowledge of use of DME or AE;Decreased knowledge of precautions      OT Treatment/Interventions: Self-care/ADL training;Therapeutic exercise;DME and/or AE instruction;Balance training;Patient/family education;Therapeutic activities    OT Goals(Current goals can be found in the care plan section) Acute Rehab OT Goals Patient Stated Goal: decr pain OT Goal Formulation: With patient Time For Goal Achievement: 11/21/22 Potential to Achieve Goals: Good  OT Frequency: Min 2X/week    Co-evaluation              AM-PAC OT "6 Clicks" Daily Activity     Outcome Measure Help from another person eating meals?: A Little Help from another person taking care of personal grooming?: A Little Help from another person toileting, which includes using toliet, bedpan, or urinal?: A Lot Help from another person bathing (including washing, rinsing, drying)?: A Lot Help from another person to put on and taking off regular upper body clothing?: A Little Help from another person to put on and taking off regular lower body clothing?: A Lot 6 Click Score: 15   End of Session Equipment Utilized During Treatment: Oxygen Nurse Communication: Mobility status;Weight bearing status;Precautions  Activity Tolerance: Patient tolerated treatment well Patient left: in chair;with call bell/phone within reach;with chair alarm set;with family/visitor present  OT Visit Diagnosis: Muscle weakness (generalized) (M62.81);Other symptoms and signs involving cognitive function;History of falling (Z91.81);Unsteadiness on feet (R26.81)                 Time: 1610-9604 OT Time Calculation (min): 29 min Charges:  OT General Charges $OT Visit: 1 Visit OT Evaluation $OT Eval Low Complexity: 1 Low  Tyler Deis, OTR/L [facility] Acute Rehabilitation Office: [phone]   Myrla Halsted 11/07/2022, 9:04 AM

## 2022-11-07 NOTE — Progress Notes (Deleted)
Office Visit    Patient Name: Dana Morrison Date of Encounter: 11/07/2022  PCP:  Charlane Ferretti, DO   [facility]  Cardiologist:  Charlton Haws, MD  Advanced Practice Provider:  No care team member to display Electrophysiologist:  None   HPI    Dana Morrison is a 89 y.o. female with a past medical history of stent January 2016, PAF, PPM, and HLD presents today for follow-up appointment.  Patient had no chest pain indicating MI in 2010 with stents with repeat in 2016.  Chronic dyspnea.  Pacer placed 2015 and was seen by Dr. Ladona Ridgel for pacer check.  Was driving and living independently.  TTE 09/20/2017 with EF 55 to 60%, estimated PA 35 mmHg, no significant valve disease.  Has a history of PAF diagnosed with Dr. Ladona Ridgel.  On low-dose Eliquis, Lopressor, and Multaq.  Had some high RV capture readings and PPM reprogrammed/12/21.  PAF burden at that time was less than 1%.  CXR done which looked okay.  When she was last seen 12/15/2021 she was living at [facility].  Has been vaccinated.  No angina.  Has PPM with follow-up.  Today, she***  Past Medical History    Past Medical History:  Diagnosis Date   Atrial fibrillation (HCC)    Chronic diastolic heart failure (HCC) 09/12/2017   Coronary artery disease    Decreased vision 09/12/2017   Hx of myocardial infarction 2010   Hypertension    Osteoarthritis of hip 09/12/2017   Osteoarthritis of knee 09/12/2017   Paroxysmal atrial fibrillation (HCC) 09/12/2017   Scalp psoriasis 09/12/2017   Seasonal allergic rhinitis due to pollen 09/12/2017   Urticaria 09/12/2017   Venous insufficiency 09/12/2017   Past Surgical History:  Procedure Laterality Date   APPENDECTOMY     ARM FRACTURE Left    FRACTURE SURGERY Bilateral    WRISTS   HIP PINNING,CANNULATED Right 08/23/2022   Procedure: CANNULATED HIP PINNING;  Surgeon: Myrene Galas, MD;  Location: [facility] OR;  Service: Orthopedics;  Laterality: Right;   INTRAMEDULLARY  (IM) NAIL INTERTROCHANTERIC Right 11/06/2022   Procedure: HIP NAILING  AND REMOVAL OF HARDWARE;  Surgeon: Myrene Galas, MD;  Location: [facility] OR;  Service: Orthopedics;  Laterality: Right;   LAPAROSCOPIC LYSIS OF ADHESIONS     PACEMAKER INSERTION     PLACEMENT OF STENTS     X5   REPLACEMENT TOTAL KNEE     REVISION TOTAL HIP ARTHROPLASTY Left     Allergies  Allergies  Allergen Reactions   Psyllium Diarrhea   Other     Per patient she has an intolerance to some legumes. She does not have a true allergy. Pt's son confirmed this as well.      EKGs/Labs/Other Studies Reviewed:   The following studies were reviewed today: Cardiac Studies & Procedures       ECHOCARDIOGRAM  ECHOCARDIOGRAM COMPLETE 10/28/2022  Narrative ECHOCARDIOGRAM REPORT    Patient Name:   Dana Morrison Date of Exam: 10/28/2022 Medical Rec #:  [id number]      Height:       66.0 in Accession #:    [id number]     Weight:       192.9 lb Date of Birth:  13-Jan-1924       BSA:          1.969 m Patient Age:    89 years       BP:           108/52 mmHg Patient  Gender: F              HR:           86 bpm. Exam Location:  Inpatient  Procedure: 2D Echo, Cardiac Doppler, Color Doppler and Intracardiac Opacification Agent  Indications:    CHF-Acute Diastolic I50.31  History:        Patient has prior history of Echocardiogram examinations, most recent 09/20/2017. CHF, CAD, Pacemaker; Risk Factors:Dyslipidemia, Hypertension and Former Smoker.  Sonographer:    Dondra Prader RVT RCS Referring Phys: [id number] Angie Fava   Sonographer Comments: Technically difficult study due to poor echo windows, suboptimal parasternal window and suboptimal apical window. IMPRESSIONS   1. Left ventricular ejection fraction, by estimation, is 35 to 40%. The left ventricle has moderately decreased function. The left ventricle demonstrates global hypokinesis. Left ventricular diastolic parameters are consistent with Grade I  diastolic dysfunction (impaired relaxation). 2. Right ventricular systolic function is normal. The right ventricular size is mildly enlarged. There is moderately elevated pulmonary artery systolic pressure. The estimated right ventricular systolic pressure is 53.4 mmHg. 3. Right atrial size was mildly dilated. 4. The mitral valve is grossly normal. Mild mitral valve regurgitation. No evidence of mitral stenosis. 5. The aortic valve was not well visualized. Aortic valve regurgitation is not visualized. No aortic stenosis is present. 6. The inferior vena cava is dilated in size with <50% respiratory variability, suggesting right atrial pressure of 15 mmHg.  FINDINGS Left Ventricle: Left ventricular ejection fraction, by estimation, is 35 to 40%. The left ventricle has moderately decreased function. The left ventricle demonstrates global hypokinesis. Definity contrast agent was given IV to delineate the left ventricular endocardial borders. The left ventricular internal cavity size was normal in size. There is no left ventricular hypertrophy. Abnormal (paradoxical) septal motion, consistent with RV pacemaker. Left ventricular diastolic parameters are consistent with Grade I diastolic dysfunction (impaired relaxation).  Right Ventricle: The right ventricular size is mildly enlarged. No increase in right ventricular wall thickness. Right ventricular systolic function is normal. There is moderately elevated pulmonary artery systolic pressure. The tricuspid regurgitant velocity is 3.10 m/s, and with an assumed right atrial pressure of 15 mmHg, the estimated right ventricular systolic pressure is 53.4 mmHg.  Left Atrium: Left atrial size was normal in size.  Right Atrium: Right atrial size was mildly dilated.  Pericardium: There is no evidence of pericardial effusion.  Mitral Valve: The mitral valve is grossly normal. Mild mitral valve regurgitation. No evidence of mitral valve stenosis.  Tricuspid  Valve: The tricuspid valve is normal in structure. Tricuspid valve regurgitation is mild . No evidence of tricuspid stenosis.  Aortic Valve: The aortic valve was not well visualized. Aortic valve regurgitation is not visualized. No aortic stenosis is present. Aortic valve mean gradient measures 3.0 mmHg. Aortic valve peak gradient measures 5.5 mmHg. Aortic valve area, by VTI measures 3.04 cm.  Pulmonic Valve: The pulmonic valve was not well visualized. Pulmonic valve regurgitation is trivial. No evidence of pulmonic stenosis.  Aorta: The aortic root is normal in size and structure.  Venous: The inferior vena cava is dilated in size with less than 50% respiratory variability, suggesting right atrial pressure of 15 mmHg.  IAS/Shunts: No atrial level shunt detected by color flow Doppler.  Additional Comments: A device lead is visualized in the right atrium and right ventricle.   LEFT VENTRICLE PLAX 2D LVIDd:         4.80 cm   Diastology LVIDs:  3.50 cm   LV e' medial:    6.60 cm/s LV PW:         0.90 cm   LV E/e' medial:  16.4 LV IVS:        0.80 cm   LV e' lateral:   6.15 cm/s LVOT diam:     2.00 cm   LV E/e' lateral: 17.6 LV SV:         67 LV SV Index:   34 LVOT Area:     3.14 cm   RIGHT VENTRICLE             IVC RV Basal diam:  3.90 cm     IVC diam: 2.20 cm RV Mid diam:    3.20 cm RV S prime:     14.60 cm/s TAPSE (M-mode): 2.5 cm  LEFT ATRIUM             Index        RIGHT ATRIUM           Index LA diam:        3.40 cm 1.73 cm/m   RA Area:     19.90 cm LA Vol (A2C):   68.2 ml 34.63 ml/m  RA Volume:   65.40 ml  33.21 ml/m LA Vol (A4C):   45.7 ml 23.21 ml/m LA Biplane Vol: 55.2 ml 28.03 ml/m AORTIC VALVE                    PULMONIC VALVE AV Area (Vmax):    2.82 cm     PV Vmax:          1.02 m/s AV Area (Vmean):   3.12 cm     PV Peak grad:     4.2 mmHg AV Area (VTI):     3.04 cm     PR End Diast Vel: 4.16 msec AV Vmax:           117.00 cm/s AV Vmean:           73.400 cm/s AV VTI:            0.221 m AV Peak Grad:      5.5 mmHg AV Mean Grad:      3.0 mmHg LVOT Vmax:         105.00 cm/s LVOT Vmean:        72.800 cm/s LVOT VTI:          0.214 m LVOT/AV VTI ratio: 0.97  AORTA Ao Root diam: 3.00 cm Ao Asc diam:  3.60 cm Ao Arch diam: 2.9 cm  MITRAL VALVE                TRICUSPID VALVE MV Area (PHT): 2.55 cm     TR Peak grad:   38.4 mmHg MV Decel Time: 298 msec     TR Vmax:        310.00 cm/s MR Peak grad: 72.6 mmHg MR Mean grad: 46.0 mmHg     SHUNTS MR Vmax:      426.00 cm/s   Systemic VTI:  0.21 m MR Vmean:     314.0 cm/s    Systemic Diam: 2.00 cm MV E velocity: 108.00 cm/s MV A velocity: 128.00 cm/s MV E/A ratio:  0.84  Weston Brass MD Electronically signed by Weston Brass MD Signature Date/Time: 10/28/2022/5:15:31 PM    Final              EKG:  EKG is *** ordered today.  The ekg ordered today demonstrates ***  Recent Labs: 10/27/2022: ALT 15; B Natriuretic Peptide 386.6 10/29/2022: Magnesium 2.2 11/07/2022: BUN 27; Creatinine, Ser 1.43; Hemoglobin 9.8; Platelets 186; Potassium 4.1; Sodium 132  Recent Lipid Panel No results found for: "CHOL", "TRIG", "HDL", "CHOLHDL", "VLDL", "LDLCALC", "LDLDIRECT"  Risk Assessment/Calculations:  {Does this patient have ATRIAL FIBRILLATION?[phone]}  Home Medications   No outpatient medications have been marked as taking for the 11/08/22 encounter (Appointment) with Sharlene Dory, PA-C.     Review of Systems   ***   All other systems reviewed and are otherwise negative except as noted above.  Physical Exam    VS:  There were no vitals taken for this visit. , BMI There is no height or weight on file to calculate BMI.  Wt Readings from Last 3 Encounters:  11/06/22 178 lb (80.7 kg)  11/02/22 168 lb (76.2 kg)  10/30/22 174 lb 6.1 oz (79.1 kg)     GEN: Well nourished, well developed, in no acute distress. HEENT: normal. Neck: Supple, no JVD, carotid bruits, or  masses. Cardiac: ***RRR, no murmurs, rubs, or gallops. No clubbing, cyanosis, edema.  ***Radials/PT 2+ and equal bilaterally.  Respiratory:  ***Respirations regular and unlabored, clear to auscultation bilaterally. GI: Soft, nontender, nondistended. MS: No deformity or atrophy. Skin: Warm and dry, no rash. Neuro:  Strength and sensation are intact. Psych: Normal affect.  Assessment & Plan    CAD PAF HLD PPM Dyspnea Edema         Disposition: Follow up {follow up:15908} with Charlton Haws, MD or APP.  Signed, Sharlene Dory, PA-C 11/07/2022, 7:50 PM [facility]

## 2022-11-07 NOTE — [place] FL2 (Signed)
[place] MEDICAID FL2 LEVEL OF CARE FORM     IDENTIFICATION  Patient Name: Dana Morrison Birthdate: 19-Jun-1924 Sex: female Admission Date (Current Location): 11/06/2022  [facility] and [place] Number:  Producer, television/film/video and Address:  [facility], [address] [place], [place] [address]      Provider Number: [id number]  Attending Physician Name and Address:  Dimple Nanas, MD  Relative Name and Phone Number:  Kaye, Luoma   [phone]    Current Level of Care: Hospital Recommended Level of Care: Skilled Nursing Facility Prior Approval Number:    Date Approved/Denied:   PASRR Number: [id number]  Discharge Plan: SNF    Current Diagnoses: Patient Active Problem List   Diagnosis Date Noted   Chronic kidney disease, stage 3a (HCC) 11/06/2022   Chronic cough 11/06/2022   DNR (do not resuscitate) 11/06/2022   Acute on chronic diastolic heart failure (HCC) 10/27/2022   Acute respiratory failure with hypoxia (HCC) 10/27/2022   Hypokalemia 10/27/2022   Hyperlipidemia 10/27/2022   Anemia of chronic disease 10/27/2022   Closed right hip fracture, initial encounter (HCC) 08/21/2022   Coronary artery disease 08/21/2022   Chronic bronchitis (HCC) 08/21/2022   Sick sinus syndrome (HCC) 09/22/2020   Pacemaker 09/22/2020   Seasonal allergic rhinitis due to pollen 09/12/2017   Paroxysmal atrial fibrillation (HCC) 09/12/2017   Chronic combined systolic (congestive) and diastolic (congestive) heart failure (HCC) 09/12/2017   Decreased vision 09/12/2017   Venous insufficiency 09/12/2017   Scalp psoriasis 09/12/2017   Urticaria 09/12/2017   Osteoarthritis of knee 09/12/2017   Osteoarthritis of hip 09/12/2017   Essential hypertension 09/12/2017    Orientation RESPIRATION BLADDER Height & Weight     Self, Time, Situation, Place  O2 Incontinent Weight: 178 lb (80.7 kg) Height:  5\' 7"  (170.2 cm)  BEHAVIORAL SYMPTOMS/MOOD NEUROLOGICAL BOWEL  NUTRITION STATUS      Incontinent Diet (see discharge summary)  AMBULATORY STATUS COMMUNICATION OF NEEDS Skin   Total Care Verbally Surgical wounds, Other (Comment) (redness)                       Personal Care Assistance Level of Assistance  Bathing, Feeding, Dressing Bathing Assistance: Maximum assistance Feeding assistance: Limited assistance Dressing Assistance: Maximum assistance     Functional Limitations Info  Sight, Hearing, Speech Sight Info: Adequate Hearing Info: Impaired Speech Info: Adequate    SPECIAL CARE FACTORS FREQUENCY  PT (By licensed PT), OT (By licensed OT)     PT Frequency: 5x week OT Frequency: 5x week            Contractures Contractures Info: Not present    Additional Factors Info  Code Status, Allergies Code Status Info: DNR Allergies Info: Psyllium, Other           Current Medications (11/07/2022):  This is the current hospital active medication list Current Facility-Administered Medications  Medication Dose Route Frequency Provider Last Rate Last Admin   acetaminophen (TYLENOL) tablet 650 mg  650 mg Oral Q6H PRN Montez Morita, PA-C       aspirin chewable tablet 81 mg  81 mg Oral Daily Montez Morita, PA-C   81 mg at 11/07/22 0910   bisacodyl (DULCOLAX) EC tablet 5 mg  5 mg Oral Daily PRN Montez Morita, PA-C       docusate sodium (COLACE) capsule 100 mg  100 mg Oral BID Montez Morita, PA-C   100 mg at 11/07/22 0910   enoxaparin (LOVENOX)  injection 30 mg  30 mg Subcutaneous Q24H Montez Morita, PA-C   30 mg at 11/07/22 0910   feeding supplement (ENSURE ENLIVE / ENSURE PLUS) liquid 237 mL  237 mL Oral BID BM Amin, Ankit Chirag, MD   237 mL at 11/07/22 1308   guaiFENesin (ROBITUSSIN) 100 MG/5ML liquid 5 mL  5 mL Oral Q4H PRN Amin, Ankit Chirag, MD       hydrALAZINE (APRESOLINE) injection 10 mg  10 mg Intravenous Q4H PRN Amin, Ankit Chirag, MD       HYDROmorphone (DILAUDID) injection 0.5 mg  0.5 mg Intravenous Q2H PRN Montez Morita, PA-C   0.5 mg at  11/07/22 1343   ipratropium-albuterol (DUONEB) 0.5-2.5 (3) MG/3ML nebulizer solution 3 mL  3 mL Nebulization Q4H PRN Amin, Ankit Chirag, MD       isosorbide mononitrate (IMDUR) 24 hr tablet 30 mg  30 mg Oral Daily Montez Morita, PA-C       menthol-cetylpyridinium (CEPACOL) lozenge 3 mg  1 lozenge Oral PRN Montez Morita, PA-C       Or   phenol (CHLORASEPTIC) mouth spray 1 spray  1 spray Mouth/Throat PRN Montez Morita, PA-C       methocarbamol (ROBAXIN) 500 mg in dextrose 5 % 50 mL IVPB  500 mg Intravenous Q6H PRN Montez Morita, PA-C       methocarbamol (ROBAXIN) tablet 500 mg  500 mg Oral Q6H PRN Jonah Blue, MD   500 mg at 11/07/22 1015   metoCLOPramide (REGLAN) tablet 5-10 mg  5-10 mg Oral Q8H PRN Montez Morita, PA-C       Or   metoCLOPramide (REGLAN) injection 5-10 mg  5-10 mg Intravenous Q8H PRN Montez Morita, PA-C       metoprolol succinate (TOPROL-XL) 24 hr tablet 25 mg  25 mg Oral Daily Montez Morita, PA-C       metoprolol tartrate (LOPRESSOR) injection 5 mg  5 mg Intravenous Q4H PRN Amin, Loura Halt, MD       mometasone-formoterol (DULERA) 100-5 MCG/ACT inhaler 2 puff  2 puff Inhalation BID Montez Morita, PA-C   2 puff at 11/07/22 1610   multivitamin (RENA-VIT) tablet 1 tablet  1 tablet Oral QHS Amin, Ankit Chirag, MD       ondansetron (ZOFRAN) tablet 4 mg  4 mg Oral Q6H PRN Montez Morita, PA-C       Or   ondansetron [facility]) injection 4 mg  4 mg Intravenous Q6H PRN Montez Morita, PA-C       oxyCODONE (Oxy IR/ROXICODONE) immediate release tablet 5 mg  5 mg Oral Q4H PRN Montez Morita, PA-C   5 mg at 11/07/22 1308   pantoprazole (PROTONIX) EC tablet 40 mg  40 mg Oral Daily Montez Morita, PA-C   40 mg at 11/07/22 0910   polyethylene glycol (MIRALAX / GLYCOLAX) packet 17 g  17 g Oral Daily PRN Montez Morita, PA-C       pravastatin (PRAVACHOL) tablet 20 mg  20 mg Oral Daily Montez Morita, PA-C   20 mg at 11/07/22 0910   traZODone (DESYREL) tablet 50 mg  50 mg Oral QHS PRN Dimple Nanas, MD        Facility-Administered Medications Ordered in Other Encounters  Medication Dose Route Frequency Provider Last Rate Last Admin   bupivacaine-epinephrine (PF) (MARCAINE W/ EPI) 0.5% -1:200000 injection   Peri-NEURAL Anesthesia Intra-op Beryle Lathe, MD   25 mL at 08/22/22 1256     Discharge Medications: Please see discharge summary for a list  of discharge medications.  Relevant Imaging Results:  Relevant Lab Results:   Additional Information SSN [id number]  Dossie Der, [place], [place]

## 2022-11-07 NOTE — Progress Notes (Signed)
Patient is only wanting Dilaudid iv before the two hours is even up.  She is refusing any of the pain pills.  She has been educated on the PO medications however she only want iv Dilaudid.  2254 Patient agreed to take a muscle relaxer with her Dilaudid iv this time.

## 2022-11-07 NOTE — Evaluation (Addendum)
Physical Therapy Evaluation Patient Details Name: Dana Morrison MRN: [id number] DOB: December 18, 1923 Today's Date: 11/07/2022  History of Present Illness    Pt is a 89 y.o. female presenting 4/30 after a fall at her ILF with resulting R hip pain. Now s/p intramedullary nailing R femur and removal of deep implant. Recent admission for R femoral neck fracture and ORIF for management. PMHx:  osteoporosis, HOH, a-fib, CHFN CAD, low vision, MI HTN, OA hip and knee, urticaria, venous insufficiency       Clinical Impression  Pt seen for PT evaluation with co-tx with OT for pt & therapists safety. Prior to admission pt resided in ILF & used rollator for mobility. PT educates pt on TDWB RLE with pt voicing understanding but pt would benefit from ongoing education. Pt requires max assist +2 for bed mobility, min assist +2 for lateral scoot bed>drop arm recliner. Pt is perseverative on R hip pain throughout session & requires cuing for safety/technique with mobility.  Pt also with tachypnea throughout session with PT/OT attempting to provide relaxation techniques & cuing for deep breathing. Of note, pt with drop in BP after transferring to recliner but increased once BLE elevated & leaned back - nurse notified. Continue to recommend ongoing PT services to address strengthening, balance, & activity tolerance to increase independence with mobility.  BP checked in LUE Sitting EOB 104/46 mmHg MAP 63 Sitting in recliner: 79/47 mmHg MAP 58 Reclined in recliner: 108/46 mmHg MAP 64     Recommendations for follow up therapy are one component of a multi-disciplinary discharge planning process, led by the attending physician.  Recommendations may be updated based on patient status, additional functional criteria and insurance authorization.  Follow Up Recommendations Can patient physically be transported by private vehicle: No     Assistance Recommended at Discharge Frequent or constant Supervision/Assistance  Patient  can return home with the following  Two people to help with walking and/or transfers;Two people to help with bathing/dressing/bathroom;Help with stairs or ramp for entrance;Assist for transportation;Assistance with cooking/housework    Equipment Recommendations None recommended by PT (TBD in next venue)  Recommendations for Other Services       Functional Status Assessment Patient has had a recent decline in their functional status and demonstrates the ability to make significant improvements in function in a reasonable and predictable amount of time.     Precautions / Restrictions Precautions Precautions: Fall Restrictions Weight Bearing Restrictions: Yes RLE Weight Bearing: Touchdown weight bearing      Mobility  Bed Mobility Overal bed mobility: Needs Assistance Bed Mobility: Supine to Sit     Supine to sit: Max assist, +2 for physical assistance     General bed mobility comments: HOB partially elevated, pt requires max assist to move LE towards EOB but pt able to upright trunk, requires guidance/assistance to turn to sitting EOB.    Transfers Overall transfer level: Needs assistance   Transfers: Bed to chair/wheelchair/BSC            Lateral/Scoot Transfers: Min assist, +2 physical assistance, +2 safety/equipment General transfer comment: Pt performs lateral scoot bed>recliner; pt impulsive with movement, initiating it on her own. Requires cuing for head/hips relationship.    Ambulation/Gait                  Stairs            Wheelchair Mobility    Modified Rankin (Stroke Patients Only)       Balance Overall balance assessment: Needs assistance  Sitting-balance support: Bilateral upper extremity supported, Feet supported Sitting balance-Leahy Scale: Poor Sitting balance - Comments: Frequent reliance on BUE support                                     Pertinent Vitals/Pain Pain Assessment Pain Assessment: Faces Faces Pain  Scale: Hurts whole lot Pain Location: R hip with mobility Pain Descriptors / Indicators: Aching, Sore, Operative site guarding Pain Intervention(s): Monitored during session, Repositioned, Utilized relaxation techniques, Limited activity within patient's tolerance    Home Living Family/patient expects to be discharged to:: Private residence (ILF apartment) Living Arrangements: Alone Available Help at Discharge: Available PRN/intermittently;Home health;Neighbor Type of Home: Independent living facility Home Access: Level entry       Home Layout: One level Home Equipment: Rollator (4 wheels);Shower seat - built in;Grab bars - toilet;Grab bars - tub/shower;Wheelchair - manual;Other (comment) (lift bed) Additional Comments: Pt was independent with  Rollator in IL at [employer]    Prior Function Prior Level of Function : Independent/Modified Independent;History of Falls (last six months)             Mobility Comments: rollator with no assist in IL situation ADLs Comments: Pt reports independence since last admission     Hand Dominance        Extremity/Trunk Assessment   Upper Extremity Assessment Upper Extremity Assessment: Overall WFL for tasks assessed    Lower Extremity Assessment Lower Extremity Assessment: RLE deficits/detail RLE Deficits / Details: not able to lift against gravity in supine or sitting RLE: Unable to fully assess due to pain    Cervical / Trunk Assessment Cervical / Trunk Assessment: Kyphotic (forward head)  Communication   Communication: HOH  Cognition Arousal/Alertness: Awake/alert Behavior During Therapy: WFL for tasks assessed/performed Overall Cognitive Status: Impaired/Different from baseline Area of Impairment: Problem solving, Following commands, Attention, Safety/judgement                   Current Attention Level: Sustained   Following Commands: Follows one step commands consistently, Follows one step commands with increased  time Safety/Judgement: Decreased awareness of safety   Problem Solving: Slow processing, Requires verbal cues General Comments: Pt highly distracted by pain requiring cuing for deep breathing for pain management & tachypnea but pt quickly loosing focus of this. Therapists attempted to provide distraction from pain.        General Comments      Exercises     Assessment/Plan    PT Assessment Patient needs continued PT services  PT Problem List Decreased strength;Decreased activity tolerance;Decreased balance;Decreased mobility;Decreased knowledge of use of DME;Decreased safety awareness;Cardiopulmonary status limiting activity;Pain;Decreased range of motion;Decreased knowledge of precautions       PT Treatment Interventions DME instruction;Gait training;Functional mobility training;Therapeutic activities;Therapeutic exercise;Balance training;Patient/family education;Stair training;Neuromuscular re-education;Modalities;Wheelchair mobility training;Manual techniques    PT Goals (Current goals can be found in the Care Plan section)  Acute Rehab PT Goals Patient Stated Goal: decreased pain PT Goal Formulation: With patient Time For Goal Achievement: 11/21/22 Potential to Achieve Goals: Fair    Frequency Min 3X/week     Co-evaluation PT/OT/SLP Co-Evaluation/Treatment: Yes Reason for Co-Treatment: For patient/therapist safety;To address functional/ADL transfers;Complexity of the patient's impairments (multi-system involvement) PT goals addressed during session: Mobility/safety with mobility;Strengthening/ROM;Balance         AM-PAC PT "6 Clicks" Mobility  Outcome Measure Help needed turning from your back to your side while in a flat bed  without using bedrails?: A Lot Help needed moving from lying on your back to sitting on the side of a flat bed without using bedrails?: Total Help needed moving to and from a bed to a chair (including a wheelchair)?: A Lot Help needed standing  up from a chair using your arms (e.g., wheelchair or bedside chair)?: Total Help needed to walk in hospital room?: Total Help needed climbing 3-5 steps with a railing? : Total 6 Click Score: 8    End of Session Equipment Utilized During Treatment: Oxygen Activity Tolerance: Patient limited by pain (limited by low BP with mobility) Patient left: in chair;with call bell/phone within reach;with chair alarm set;with family/visitor present Nurse Communication: Mobility status PT Visit Diagnosis: Pain;Difficulty in walking, not elsewhere classified (R26.2);Other abnormalities of gait and mobility (R26.89) Pain - Right/Left: Right Pain - part of body: Hip;Leg    Time: 1610-9604 PT Time Calculation (min) (ACUTE ONLY): 29 min   Charges:   PT Evaluation $PT Eval Moderate Complexity: 1 Mod          Aleda Grana, PT, DPT 11/09/22, 7:49 AM   Sandi Mariscal 11/07/2022, 12:16 PM

## 2022-11-07 NOTE — Progress Notes (Signed)
Orthopaedic Trauma Service Progress Note  Patient ID: Dana Morrison MRN: [id number] DOB/AGE: Nov 24, 1923 89 y.o.  Subjective:  No acute issues Sitting in chair trying to sleep  Pain tolerable in R leg    ROS As above  Objective:   VITALS:   Vitals:   11/06/22 2014 11/07/22 0435 11/07/22 0728 11/07/22 0836  BP: (!) 105/58 (!) 108/45 (!) 103/52   Pulse: 90 96 92 98  Resp: 20 20  18   Temp: 97.8 F (36.6 C) 97.9 F (36.6 C) 97.8 F (36.6 C)   TempSrc: Oral Axillary Oral   SpO2: 100% 97% 98% 100%  Weight:      Height:        Estimated body mass index is 27.88 kg/m as calculated from the following:   Height as of this encounter: 5\' 7"  (1.702 m).   Weight as of this encounter: 80.7 kg.   Intake/Output      04/30 0701 05/01 0700 05/01 0701 05/02 0700   P.O. 100    IV Piggyback 100    Total Intake(mL/kg) 200 (2.5)    Urine (mL/kg/hr) 1050 (0.5)    Blood 200    Total Output 1250    Net -1050           LABS  Results for orders placed or performed during the hospital encounter of 11/06/22 (from the past 24 hour(s))  Surgical pcr screen     Status: None   Collection Time: 11/06/22 10:01 AM   Specimen: Nasal Mucosa; Nasal Swab  Result Value Ref Range   MRSA, PCR NEGATIVE NEGATIVE   Staphylococcus aureus NEGATIVE NEGATIVE  CBC     Status: Abnormal   Collection Time: 11/07/22  1:41 AM  Result Value Ref Range   WBC 6.9 4.0 - 10.5 K/uL   RBC 3.82 (L) 3.87 - 5.11 MIL/uL   Hemoglobin 9.8 (L) 12.0 - 15.0 g/dL   HCT 78.2 (L) 95.6 - 21.3 %   MCV 82.5 80.0 - 100.0 fL   MCH 25.7 (L) 26.0 - 34.0 pg   MCHC 31.1 30.0 - 36.0 g/dL   RDW 08.6 (H) 57.8 - 46.9 %   Platelets 186 150 - 400 K/uL   nRBC 0.0 0.0 - 0.2 %  Basic metabolic panel     Status: Abnormal   Collection Time: 11/07/22  1:41 AM  Result Value Ref Range   Sodium 132 (L) 135 - 145 mmol/L   Potassium 4.1 3.5 - 5.1 mmol/L   Chloride 97  (L) 98 - 111 mmol/L   CO2 22 22 - 32 mmol/L   Glucose, Bld 149 (H) 70 - 99 mg/dL   BUN 27 (H) 8 - 23 mg/dL   Creatinine, Ser 6.29 (H) 0.44 - 1.00 mg/dL   Calcium 7.9 (L) 8.9 - 10.3 mg/dL   GFR, Estimated 34 (L) >60 mL/min   Anion gap 13 5 - 15     PHYSICAL EXAM:   Gen: resting comfortably in chair, NAD  Lungs: unlabored Ext:       Right Lower Extremity   Dressings R thigh are clean, dry and intact  Ext warm   Mild swelling  + DP pulse  No DCT  Compartments are soft  EHL, FHL, lesser toe motor intact  Ankle flexion, extension, inversion and eversion intact  Assessment/Plan: 1 Day Post-Op     Anti-infectives (From admission, onward)    Start     Dose/Rate Route Frequency Ordered Stop   11/06/22 1800  ceFAZolin (ANCEF) IVPB 2g/100 mL premix        2 g 200 mL/hr over 30 Minutes Intravenous Every 6 hours 11/06/22 1436 11/07/22 0043   11/06/22 0945  ceFAZolin (ANCEF) IVPB 2g/100 mL premix        2 g 200 mL/hr over 30 Minutes Intravenous To Surgery 11/06/22 0935 11/06/22 1129     .  POD/HD#: 1  89 y/o female s/p fall with R proximal femoral shaft fracture, retained R femoral neck screws   -R proximal femoral shaft fracture with retained R femoral neck screws s/p ROH and IMN R femur  Weightbearing TDWB R leg with assistance. Will likely advance at first post op visit   ROM/Activity   ROM as tolerated R hip and knee   Slowly increase activity level   Wound care   Dressing changes starting tomorrow as needed   - Pain management:  Multimodal  Minimize narcotics  - ABL anemia/Hemodynamics  Monitor  Currently stable  - Medical issues   Per medicine  - DVT/PE prophylaxis:  Lovenox and scds  Dc on eliquis 2.5 mg po BID x 21 days   - ID:   Periop abx  - Activity:  As above  - FEN/GI prophylaxis/Foley/Lines:  Reg diet  - Dispo:  Therapy evals  Home likely Friday so she can attend her birthday party on Saturday     Mearl Latin,  PA-C [phone] (C) 11/07/2022, 9:42 AM  [facility] [address] [place] [address] [phone] Collier Bullock (F)    After 5pm and on the weekends please log on to Amion, go to orthopaedics and the look under the [facility] Call for the provider(s) on call. You can also call our office at [phone] and then follow the prompts to be connected to the call team.  Patient ID: Dana Morrison, female   DOB: 1923/09/15, 89 y.o.   MRN: [id number]

## 2022-11-07 NOTE — Progress Notes (Signed)
Initial Nutrition Assessment  DOCUMENTATION CODES:   Not applicable  INTERVENTION:   - Ensure Enlive po BID, each supplement provides 350 kcal and 20 grams of protein  - Renal MVI daily given CKD stage III  NUTRITION DIAGNOSIS:   Increased nutrient needs related to hip fracture, post-op healing as evidenced by estimated needs.  GOAL:   Patient will meet greater than or equal to 90% of their needs  MONITOR:   PO intake, Supplement acceptance, Weight trends, Labs, I & O's  REASON FOR ASSESSMENT:   Consult Hip fracture protocol  ASSESSMENT:   89 year old female who presented to the ED on 4/30 after a fall. PMH of atrial fibrillation, CHF, CAD, HTN, CKD stage III. Pt admitted with R periprosthetic hip fx.  04/30 - s/p IMN R femur, removal of deep implant  Spoke with pt at bedside. RN and NT in room providing nursing care at time of RD visit. Pt reports that she did not eat much breakfast this morning. She states that she was feeling hungry but that the smell of the food made her "stomach churn." She was able to eat a few bites of eggs and drink some juice.  Pt shares that at home, she has a great appetite and eats 3 meals daily. Pt cooks her own food. A typical meal may include meatloaf with vegetables OR pork chop with vegetables OR chicken breast with vegetables.  Pt reports that she used to weigh 180-183 lbs but that this was because she had a lot of extra fluid on her. She reports that "they removed the fluid" and her weight came down to 168 lbs. She believes it has been stable at 168 lbs but hasn't been home recently to check. Reviewed weight history in chart. Weight down 6.8 kg since 08/22/22. This is a 7.8% weight loss in 2.5 months which is clinically significant for timeframe. However, suspect weight loss/weight fluctuations are due largely in part to volume status given CHF diagnosis. Current weight may be falsely elevated secondary to edema. Pt currently with non-pitting  edema to BLE.  Pt willing to consume oral nutrition supplements during admission to aid in meeting increased kcal and protein needs. RD to order. Will also order daily MVI with minerals. RD provided pt with a chocolate Ensure Enlive at time of visit; RN aware.  Medications reviewed and include: colace, protonix, spironolactone  Labs reviewed: sodium 132, chloride 97, BUN 27, creatinine 1.43, hemoglobin 9.8  UOP: 1050 ml x 24 hours  NUTRITION - FOCUSED PHYSICAL EXAM:  Flowsheet Row Most Recent Value  Orbital Region Moderate depletion  Upper Arm Region No depletion  Thoracic and Lumbar Region Mild depletion  Buccal Region No depletion  Temple Region Mild depletion  Clavicle Bone Region Mild depletion  Clavicle and Acromion Bone Region Mild depletion  Scapular Bone Region No depletion  Dorsal Hand Mild depletion  Patellar Region No depletion  Anterior Thigh Region Mild depletion  Posterior Calf Region No depletion  Edema (RD Assessment) Mild  [BLE]  Hair Reviewed  Eyes Reviewed  Mouth Reviewed  Skin Reviewed  Nails Reviewed       Diet Order:   Diet Order             Diet regular Room service appropriate? Yes; Fluid consistency: Thin  Diet effective now                   EDUCATION NEEDS:   Education needs have been addressed  Skin:  Skin Assessment:  Reviewed RN Assessment (closed incision R hip)  Last BM:  no documented BM  Height:   Ht Readings from Last 1 Encounters:  11/06/22 5\' 7"  (1.702 m)    Weight:   Wt Readings from Last 1 Encounters:  11/06/22 80.7 kg    BMI:  Body mass index is 27.88 kg/m.  Estimated Nutritional Needs:   Kcal:  1500-1700  Protein:  75-85 grams  Fluid:  1.5-1.7 L    Mertie Clause, MS, RD, LDN Inpatient Clinical Dietitian Please see AMiON for contact information.

## 2022-11-08 ENCOUNTER — Ambulatory Visit: Payer: Medicare HMO | Attending: Physician Assistant | Admitting: Physician Assistant

## 2022-11-08 DIAGNOSIS — R609 Edema, unspecified: Secondary | ICD-10-CM

## 2022-11-08 DIAGNOSIS — I2510 Atherosclerotic heart disease of native coronary artery without angina pectoris: Secondary | ICD-10-CM

## 2022-11-08 DIAGNOSIS — D62 Acute posthemorrhagic anemia: Secondary | ICD-10-CM | POA: Diagnosis not present

## 2022-11-08 DIAGNOSIS — I480 Paroxysmal atrial fibrillation: Secondary | ICD-10-CM

## 2022-11-08 DIAGNOSIS — Z950 Presence of cardiac pacemaker: Secondary | ICD-10-CM

## 2022-11-08 DIAGNOSIS — E785 Hyperlipidemia, unspecified: Secondary | ICD-10-CM

## 2022-11-08 DIAGNOSIS — R0600 Dyspnea, unspecified: Secondary | ICD-10-CM

## 2022-11-08 LAB — BPAM RBC
ISSUE DATE / TIME: 202405020819
ISSUE DATE / TIME: 202405022308

## 2022-11-08 LAB — CBC
HCT: 19.8 % — ABNORMAL LOW (ref 36.0–46.0)
Hemoglobin: 6.5 g/dL — CL (ref 12.0–15.0)
MCH: 26.2 pg (ref 26.0–34.0)
MCHC: 32.8 g/dL (ref 30.0–36.0)
MCV: 79.8 fL — ABNORMAL LOW (ref 80.0–100.0)
Platelets: 204 10*3/uL (ref 150–400)
RBC: 2.48 MIL/uL — ABNORMAL LOW (ref 3.87–5.11)
RDW: 16 % — ABNORMAL HIGH (ref 11.5–15.5)
WBC: 11.6 10*3/uL — ABNORMAL HIGH (ref 4.0–10.5)
nRBC: 0.2 % (ref 0.0–0.2)

## 2022-11-08 LAB — TYPE AND SCREEN
Antibody Screen: NEGATIVE
Unit division: 0

## 2022-11-08 LAB — BASIC METABOLIC PANEL
Anion gap: 7 (ref 5–15)
BUN: 29 mg/dL — ABNORMAL HIGH (ref 8–23)
CO2: 25 mmol/L (ref 22–32)
Calcium: 7.8 mg/dL — ABNORMAL LOW (ref 8.9–10.3)
Chloride: 100 mmol/L (ref 98–111)
Creatinine, Ser: 1.1 mg/dL — ABNORMAL HIGH (ref 0.44–1.00)
GFR, Estimated: 46 mL/min — ABNORMAL LOW (ref >60)
Glucose, Bld: 118 mg/dL — ABNORMAL HIGH (ref 70–99)
Potassium: 4.1 mmol/L (ref 3.5–5.1)
Sodium: 132 mmol/L — ABNORMAL LOW (ref 135–145)

## 2022-11-08 LAB — HEMOGLOBIN AND HEMATOCRIT, BLOOD
HCT: 22.5 % — ABNORMAL LOW (ref 36.0–46.0)
HCT: 20.3 % — ABNORMAL LOW (ref 36.0–46.0)
Hemoglobin: 7.4 g/dL — ABNORMAL LOW (ref 12.0–15.0)
Hemoglobin: 6.7 g/dL — CL (ref 12.0–15.0)

## 2022-11-08 LAB — PREPARE RBC (CROSSMATCH)

## 2022-11-08 MED ORDER — ACETAMINOPHEN 325 MG PO TABS
650.0000 mg | ORAL_TABLET | Freq: Four times a day (QID) | ORAL | Status: DC
  Administered 2022-11-08 – 2022-11-13 (×16): 650 mg via ORAL
  Filled 2022-11-08 (×17): qty 2

## 2022-11-08 MED ORDER — SODIUM CHLORIDE 0.9% IV SOLUTION: Freq: Once | INTRAVENOUS | Status: DC

## 2022-11-08 MED ORDER — SODIUM CHLORIDE 0.9% IV SOLUTION: Freq: Once | INTRAVENOUS | Status: AC

## 2022-11-08 NOTE — Progress Notes (Signed)
Lab called with a critical hbg 6.5 this morning.   On call MD/NP placed order to transfuse 1 unit PRBC's.  Blood bank secure chat that blood is ready.  Patient is not visibly bleeding and is asymptomatic.  Will have day shift to follow up.

## 2022-11-08 NOTE — Progress Notes (Signed)
    Patient Name: Dana Morrison           DOB: [birth year]  MRN: [id number]      Admission Date: 11/06/2022  Attending Provider: Barnetta Chapel, MD  Primary Diagnosis: Hip fracture [facility])   Level of care: Med-Surg    CROSS COVER NOTE   Date of Service   11/08/2022   Dana Morrison, 89 y.o. female, was admitted on 11/06/2022 for Hip fracture [facility]).    HPI/Events of Note   Hemoglobin 7.4--> 6.7.  Hemodynamically stable, BP soft 98/52.  Likely blood loss anemia. No reports of melena, hematochezia, bleeding or bruising.   Interventions/ Plan   Transfusion, 1 unit PRBC        Anthoney Harada, DNP, [employer] [facility] [place]

## 2022-11-08 NOTE — Care Management Important Message (Signed)
Important Message  Patient Details  Name: Dana Morrison MRN: [id number] Date of Birth: 08-20-1923   Medicare Important Message Given:  Yes     Sherilyn Banker 11/08/2022, 12:32 PM

## 2022-11-08 NOTE — TOC Progression Note (Addendum)
Transition of Care [facility]) - Progression Note    Patient Details  Name: Dana Morrison MRN: [id number] Date of Birth: 11-05-1923  Transition of Care [facility] CM/SW Contact  Lorri Frederick, LCSW Phone Number: 11/08/2022, 12:54 PM  Clinical Narrative:   Per [facility], [employer] could pay for copays but is very slow to do so, family would have to pay upfront and be reimbursed.  They can offer bed.  Need to know plan for after STR.   1130: CSW spoke with son Brett Canales, all bed offers provided.  He is looking into ALF.  If pt has to return to her independent living apartment until ALF in place, he can arrange 24/7 care.  Updated him on tricare info.  He will discuss with brother regarding SNF choice.   1545: SNF auth request submitted in [employer] with facility choice pending.     Expected Discharge Plan: Skilled Nursing Facility Barriers to Discharge: Continued Medical Work up, SNF Pending bed offer  Expected Discharge Plan and Services In-house Referral: Clinical Social Work   Post Acute Care Choice: Skilled Nursing Facility Living arrangements for the past 2 months: Independent Living Facility Risk manager)                                       Social Determinants of Health (SDOH) Interventions SDOH Screenings   Food Insecurity: No Food Insecurity (08/22/2022)  Housing: Low Risk  (08/22/2022)  Transportation Needs: Unknown (08/22/2022)  Utilities: Not At Risk (08/22/2022)  Tobacco Use: Medium Risk (11/07/2022)    Readmission Risk Interventions     No data to display

## 2022-11-08 NOTE — Progress Notes (Signed)
Orthopaedic Trauma Service Progress Note  Patient ID: Dana Morrison MRN: [id number] DOB/AGE: 15-Jul-1923 89 y.o.  Subjective:  C/o pain in R leg  Received 1 unit PRBCs this am  Will recheck h/h later this pm     ROS As above  Objective:   VITALS:   Vitals:   11/08/22 0825 11/08/22 0825 11/08/22 0828 11/08/22 0846  BP:  (!) 105/48 (!) 103/59 (!) 113/47  Pulse: 95 97  (!) 102  Resp: 18 18  16   Temp:  98.3 F (36.8 C)  98.4 F (36.9 C)  TempSrc:  Oral    SpO2: 97% 96%    Weight:      Height:        Estimated body mass index is 27.88 kg/m as calculated from the following:   Height as of this encounter: 5\' 7"  (1.702 m).   Weight as of this encounter: 80.7 kg.   Intake/Output      05/01 0701 05/02 0700 05/02 0701 05/03 0700   P.O. 240    IV Piggyback     Total Intake(mL/kg) 240 (3)    Urine (mL/kg/hr) 600 (0.3)    Blood     Total Output 600    Net -360           LABS  Results for orders placed or performed during the hospital encounter of 11/06/22 (from the past 24 hour(s))  CBC     Status: Abnormal   Collection Time: 11/08/22  4:39 AM  Result Value Ref Range   WBC 11.6 (H) 4.0 - 10.5 K/uL   RBC 2.48 (L) 3.87 - 5.11 MIL/uL   Hemoglobin 6.5 (LL) 12.0 - 15.0 g/dL   HCT 21.3 (L) 08.6 - 57.8 %   MCV 79.8 (L) 80.0 - 100.0 fL   MCH 26.2 26.0 - 34.0 pg   MCHC 32.8 30.0 - 36.0 g/dL   RDW 46.9 (H) 62.9 - 52.8 %   Platelets 204 150 - 400 K/uL   nRBC 0.2 0.0 - 0.2 %  Basic metabolic panel     Status: Abnormal   Collection Time: 11/08/22  4:39 AM  Result Value Ref Range   Sodium 132 (L) 135 - 145 mmol/L   Potassium 4.1 3.5 - 5.1 mmol/L   Chloride 100 98 - 111 mmol/L   CO2 25 22 - 32 mmol/L   Glucose, Bld 118 (H) 70 - 99 mg/dL   BUN 29 (H) 8 - 23 mg/dL   Creatinine, Ser 4.13 (H) 0.44 - 1.00 mg/dL   Calcium 7.8 (L) 8.9 - 10.3 mg/dL   GFR, Estimated 46 (L) >60 mL/min   Anion gap 7 5 -  15  Prepare RBC (crossmatch)     Status: None   Collection Time: 11/08/22  5:45 AM  Result Value Ref Range   Order Confirmation      ORDER PROCESSED BY BLOOD BANK Performed at [facility], [address], [place], [place] [address]      PHYSICAL EXAM:   Gen: resting in bed,  NAD  Lungs: unlabored Ext:       Right Lower Extremity              Dressings R thigh are clean, dry and intact  Ext warm              Mild swelling             + DP pulse             No DCT             Compartments are soft             EHL, FHL, lesser toe motor intact             Ankle flexion, extension, inversion and eversion intact               Assessment/Plan: 2 Days Post-Op     Anti-infectives (From admission, onward)    Start     Dose/Rate Route Frequency Ordered Stop   11/06/22 1800  ceFAZolin (ANCEF) IVPB 2g/100 mL premix        2 g 200 mL/hr over 30 Minutes Intravenous Every 6 hours 11/06/22 1436 11/07/22 0043   11/06/22 0945  ceFAZolin (ANCEF) IVPB 2g/100 mL premix        2 g 200 mL/hr over 30 Minutes Intravenous To Surgery 11/06/22 0935 11/06/22 1129     .  POD/HD#: 2  89 y/o female s/p fall with R proximal femoral shaft fracture, retained R femoral neck screws    -R proximal femoral shaft fracture with retained R femoral neck screws s/p ROH and IMN R femur  Weightbearing TDWB R leg with assistance. Will likely advance at first post op visit               ROM/Activity                         ROM as tolerated R hip and knee                         Slowly increase activity level               Wound care                         Dressing changes as needed    - Pain management:             Multimodal             Minimize narcotics   - ABL anemia/Hemodynamics             transfused with 1 unit PRBCs today   Recheck h/h this afternoon    - Medical issues              Per medicine   - DVT/PE prophylaxis:             Lovenox and scds             Dc on  eliquis 2.5 mg po BID x 21 days    - ID:              Periop abx completed    - Activity:             As above   - FEN/GI prophylaxis/Foley/Lines:             Reg diet   - Dispo:             Therapy evals             Home likely tomorrow so she  can attend her birthday party on Saturday    Mearl Latin, PA-C [phone] (C) 11/08/2022, 10:28 AM  [facility] [address] [place] [address] [phone] [phone] (F)    After 5pm and on the weekends please log on to Amion, go to orthopaedics and the look under the [facility] Call for the provider(s) on call. You can also call our office at [phone] and then follow the prompts to be connected to the call team.  Patient ID: Dana Morrison, female   DOB: 14-Nov-1923, 89 y.o.   MRN: [id number]

## 2022-11-08 NOTE — Progress Notes (Signed)
Liana Crocker NP [facility] notified critical HGB 6.7

## 2022-11-08 NOTE — TOC CAGE-AID Note (Signed)
Transition of Care [facility]) - CAGE-AID Screening   Patient Details  Name: Dana Morrison MRN: [id number] Date of Birth: June 06, 1924  Transition of Care [facility]) CM/SW Contact:    Erin Sons, LCSW Phone Number: 11/08/2022, 12:01 PM   Clinical Narrative:  CAGE-AID score of 0  CAGE-AID Screening:    Have You Ever Felt You Ought to Cut Down on Your Drinking or Drug Use?: No Have People Annoyed You By [employer] Your Drinking Or Drug Use?: No Have You Felt Bad Or Guilty About Your Drinking Or Drug Use?: No Have You Ever Had a Drink or Used Drugs First Thing In The Morning to Steady Your Nerves or to Get Rid of a Hangover?: No CAGE-AID Score: 0  Substance Abuse Education Offered: No

## 2022-11-08 NOTE — Progress Notes (Signed)
    Patient Name: Dana Morrison           DOB: August 17, 1923  MRN: [id number]      Admission Date: 11/06/2022  Attending Provider: Dimple Nanas, MD  Primary Diagnosis: Hip fracture [facility])   Level of care: Med-Surg    CROSS COVER NOTE   Date of Service   11/08/2022   Gricelda Foland, 89 y.o. female, was admitted on 11/06/2022 for Hip fracture [facility]).    HPI/Events of Note   Hemoglobin 9.8 --> 6.5.  Asymptomatic.  Hemodynamically stable. Status post IM nailing of the right femur and removal of deep implant 4/30.  EBL 200 cc. No reports of melena, hematochezia, bleeding or bruising.   Interventions/ Plan   Transfusion, 1 unit PRBC        Anthoney Harada, DNP, [employer] [facility] [place]

## 2022-11-08 NOTE — Progress Notes (Signed)
PROGRESS NOTE    Dana Michelle  Morrison:[id number] DOB: [birth year] DOA: 11/06/2022 PCP: Charlane Ferretti, DO   Brief Narrative:  89 y.o. female with medical history significant of afib on Eliquis which has been on hold due to prior GI bleed, chronic diastolic CHF, CAD, and HTN presenting with a fall.  She was last admitted from 4/20-23 with acute on chronic diastolic CHF and prior admission in 08/2022 for R hip fracture and subsequent 09/2022 admission for GI bleeding.    She reports that she got up overnight and balanced herself on the arm of the chair and then fell to the floor, landing on her R hip.  Upon admission found to have periprosthetic fracture.  11/08/2022: Patient seen.  No new complaints.  Low blood pressure is noted.  Medications reviewed.  Cautious use of opiates.  Significant drop in H/H noted s/p transfusion of 1 unit of packed red blood cells.  Will continue to monitor H/H every 8 hours x 2.  Monitor blood pressure closely.  Further management depend on hospital course.   Assessment & Plan:  Active Problems:   Paroxysmal atrial fibrillation (HCC)   Chronic combined systolic (congestive) and diastolic (congestive) heart failure (HCC)   Essential hypertension   Pacemaker   Closed right hip fracture, initial encounter (HCC)   Hyperlipidemia   Chronic kidney disease, stage 3a (HCC)   Chronic cough   DNR (do not resuscitate)     Right hip periprosthetic fracture Status post IM nailing of the right femur and removal of deep implant 4/30 Right hip periprosthetic fracture, seen by orthopedic.  Pain control, postop recommendations, pain management and DVT prophylaxis per their service 11/08/2022: Orthopedic team is driving postop management.  Low blood pressure is noted.  Drop in H/H is noted.  Transfuse packed red blood cells as needed.  Chronic combined heart failure, EF 40% with global hypokinesia -4/21 Echocardiogram with reduced LV systolic function 35 to 40%, global hypokinesis,  no significant valvular disease, grade 1 diastolic dysfunction.  Overall appears to be compensated at this time.  Continue Toprol-XL, Imdur.  Hold Aldactone 11/08/2022: Low EF may be contributing to the low blood pressure/hypotension.   Paroxysmal atrial fibrillation Continue Toprol-XL.  Has pacemaker.  Anticoagulation stopped due to recent GI bleed, follow-up outpatient  AKI on CKD stage 3a Creatinine stable around 1.0.  This morning 1.43.  Hold Aldactone 11/08/2022: BMP revealed sodium of 133, potassium of 4.1, chloride 100, CO2 25, BUN of 29 with serum creatinine of 1.1.  eGFR is 46 mL/min per 1.73 m.   Essential hypertension -Continue Toprol XL, IV as needed 11/08/2022: Low blood pressures noted.  Reviewed antihypertensives.  Reviewed medications that can drop blood pressure.  Cautious use of opiates.   Hyperlipidemia -Continue pravastatin     Chronic cough -Seen by pulm on 4/26 -CT with air trapping and minimal scarring, PFTs with air trapping -Bronchodilators, I-S/flutter valve  Anemia: -Likely blood loss anemia. -Hemoglobin on presentation was 9.8, dropped to 6.5 g/dL.  After 1 unit of packed red blood cell transfusion, repeat hemoglobin was 7.4 g/dL. -Continue to monitor H/H.   DNR -I have discussed code status with the patient and her son and they are in agreement that the patient would not desire resuscitation and would prefer to die a natural death should that situation arise.    DVT prophylaxis: Lovenox Code Status: DNR Family Communication:   Status is: Inpatient Postop management per orthopedic Awaiting renal function to improve.  Pending PT/OT  Diet Orders (From admission, onward)     Start     Ordered   11/06/22 1437  Diet regular Room service appropriate? Yes; Fluid consistency: Thin  Diet effective now       Question Answer Comment  Room service appropriate? Yes   Fluid consistency: Thin      11/06/22 1436            Subjective: Seen at  bedside, does not have any new complaints. Encouraged oral intake  Examination:  General exam: Appears calm and comfortable, elderly frail Respiratory system: Clear to auscultation.  Cardiovascular system: S1 & S2 heard. Gastrointestinal system: Abdomen is soft and nontender.   Central nervous system: Awake and alert.  No focal neurological deficits.  Objective: Vitals:   11/08/22 0846 11/08/22 0928 11/08/22 1120 11/08/22 1430  BP: (!) 113/47 (!) 113/28 (!) 95/55 (!) 77/42  Pulse: (!) 102   81  Resp: 16     Temp: 98.4 F (36.9 C)  97.7 F (36.5 C) 98 F (36.7 C)  TempSrc:   Oral Oral  SpO2:   98% 96%  Weight:      Height:        Intake/Output Summary (Last 24 hours) at 11/08/2022 1540 Last data filed at 11/08/2022 1120 Gross per 24 hour  Intake 315 ml  Output 300 ml  Net 15 ml    Filed Weights   11/06/22 0524  Weight: 80.7 kg    Scheduled Meds:  acetaminophen  650 mg Oral Q6H   aspirin  81 mg Oral Daily   docusate sodium  100 mg Oral BID   enoxaparin (LOVENOX) injection  30 mg Subcutaneous Q24H   feeding supplement  237 mL Oral BID BM   isosorbide mononitrate  30 mg Oral Daily   metoprolol succinate  25 mg Oral Daily   mometasone-formoterol  2 puff Inhalation BID   multivitamin  1 tablet Oral QHS   pantoprazole  40 mg Oral Daily   pravastatin  20 mg Oral Daily   Continuous Infusions:  methocarbamol (ROBAXIN) IV      Nutritional status Signs/Symptoms: estimated needs Interventions: Ensure Enlive (each supplement provides 350kcal and 20 grams of protein), MVI Body mass index is 27.88 kg/m.  Data Reviewed:   CBC: Recent Labs  Lab 11/06/22 0535 11/07/22 0141 11/08/22 0439 11/08/22 1351  WBC 11.6* 6.9 11.6*  --   NEUTROABS 8.6*  --   --   --   HGB 9.5* 9.8* 6.5* 7.4*  HCT 29.8* 31.5* 19.8* 22.5*  MCV 81.4 82.5 79.8*  --   PLT 279 186 204  --     Basic Metabolic Panel: Recent Labs  Lab 11/06/22 0535 11/07/22 0141 11/08/22 0439  NA 140 132*  132*  K 3.8 4.1 4.1  CL 104 97* 100  CO2 22 22 25   GLUCOSE 144* 149* 118*  BUN 21 27* 29*  CREATININE 1.21* 1.43* 1.10*  CALCIUM 8.7* 7.9* 7.8*    GFR: Estimated Creatinine Clearance: 32.7 mL/min (A) (by C-G formula based on SCr of 1.1 mg/dL (H)). Liver Function Tests: No results for input(s): "AST", "ALT", "ALKPHOS", "BILITOT", "PROT", "ALBUMIN" in the last 168 hours. No results for input(s): "LIPASE", "AMYLASE" in the last 168 hours. No results for input(s): "AMMONIA" in the last 168 hours. Coagulation Profile: Recent Labs  Lab 11/06/22 0535  INR 1.1    Cardiac Enzymes: No results for input(s): "CKTOTAL", "CKMB", "CKMBINDEX", "TROPONINI" in the last 168 hours. BNP (last 3 results) No  results for input(s): "PROBNP" in the last 8760 hours. HbA1C: No results for input(s): "HGBA1C" in the last 72 hours. CBG: No results for input(s): "GLUCAP" in the last 168 hours. Lipid Profile: No results for input(s): "CHOL", "HDL", "LDLCALC", "TRIG", "CHOLHDL", "LDLDIRECT" in the last 72 hours. Thyroid Function Tests: No results for input(s): "TSH", "T4TOTAL", "FREET4", "T3FREE", "THYROIDAB" in the last 72 hours. Anemia Panel: No results for input(s): "VITAMINB12", "FOLATE", "FERRITIN", "TIBC", "IRON", "RETICCTPCT" in the last 72 hours. Sepsis Labs: No results for input(s): "PROCALCITON", "LATICACIDVEN" in the last 168 hours.  Recent Results (from the past 240 hour(s))  Surgical pcr screen     Status: None   Collection Time: 11/06/22 10:01 AM   Specimen: Nasal Mucosa; Nasal Swab  Result Value Ref Range Status   MRSA, PCR NEGATIVE NEGATIVE Final   Staphylococcus aureus NEGATIVE NEGATIVE Final    Comment: (NOTE) The Xpert SA Assay (FDA approved for NASAL specimens in patients 45 years of age and older), is one component of a comprehensive surveillance program. It is not intended to diagnose infection nor to guide or monitor treatment. Performed at [facility], [address], [place], [place] [address]          Radiology Studies: No results found.         LOS: 2 days   Time spent= 35 mins    Barnetta Chapel, MD [facility]  If 7PM-7AM, please contact night-coverage  11/08/2022, 3:40 PM

## 2022-11-09 DIAGNOSIS — S72001A Fracture of unspecified part of neck of right femur, initial encounter for closed fracture: Secondary | ICD-10-CM | POA: Diagnosis not present

## 2022-11-09 LAB — TYPE AND SCREEN: Unit division: 0

## 2022-11-09 LAB — CBC
HCT: 22.5 % — ABNORMAL LOW (ref 36.0–46.0)
Hemoglobin: 7.3 g/dL — ABNORMAL LOW (ref 12.0–15.0)
MCH: 25.6 pg — ABNORMAL LOW (ref 26.0–34.0)
MCHC: 32.4 g/dL (ref 30.0–36.0)
MCV: 78.9 fL — ABNORMAL LOW (ref 80.0–100.0)
Platelets: 162 10*3/uL (ref 150–400)
RBC: 2.85 MIL/uL — ABNORMAL LOW (ref 3.87–5.11)
RDW: 17.6 % — ABNORMAL HIGH (ref 11.5–15.5)
WBC: 8 10*3/uL (ref 4.0–10.5)
nRBC: 0 % (ref 0.0–0.2)

## 2022-11-09 LAB — RENAL FUNCTION PANEL
Albumin: 2 g/dL — ABNORMAL LOW (ref 3.5–5.0)
Anion gap: 6 (ref 5–15)
BUN: 25 mg/dL — ABNORMAL HIGH (ref 8–23)
CO2: 25 mmol/L (ref 22–32)
Calcium: 7.4 mg/dL — ABNORMAL LOW (ref 8.9–10.3)
Chloride: 101 mmol/L (ref 98–111)
Creatinine, Ser: 0.88 mg/dL (ref 0.44–1.00)
GFR, Estimated: >60 mL/min (ref >60)
Glucose, Bld: 95 mg/dL (ref 70–99)
Phosphorus: 3 mg/dL (ref 2.5–4.6)
Potassium: 4.1 mmol/L (ref 3.5–5.1)
Sodium: 132 mmol/L — ABNORMAL LOW (ref 135–145)

## 2022-11-09 LAB — BPAM RBC
Blood Product Expiration Date: 202405292359
Blood Product Expiration Date: 202406022359
Unit Type and Rh: 5100
Unit Type and Rh: 5100

## 2022-11-09 LAB — HEMOGLOBIN AND HEMATOCRIT, BLOOD
HCT: 23.6 % — ABNORMAL LOW (ref 36.0–46.0)
HCT: 21.8 % — ABNORMAL LOW (ref 36.0–46.0)
Hemoglobin: 7.9 g/dL — ABNORMAL LOW (ref 12.0–15.0)
Hemoglobin: 7.1 g/dL — ABNORMAL LOW (ref 12.0–15.0)

## 2022-11-09 MED ORDER — OXYCODONE HCL 5 MG PO TABS: 5.0000 mg | ORAL_TABLET | ORAL | 0 refills | Status: AC | PRN

## 2022-11-09 MED ORDER — ENOXAPARIN SODIUM 40 MG/0.4ML IJ SOSY
40.0000 mg | PREFILLED_SYRINGE | INTRAMUSCULAR | Status: DC
  Administered 2022-11-10 – 2022-11-13 (×4): 40 mg via SUBCUTANEOUS
  Filled 2022-11-09 (×4): qty 0.4

## 2022-11-09 MED ORDER — ENSURE ENLIVE PO LIQD: 237.0000 mL | Freq: Two times a day (BID) | ORAL | 12 refills | Status: AC

## 2022-11-09 MED ORDER — APIXABAN 2.5 MG PO TABS: 2.5000 mg | ORAL_TABLET | Freq: Two times a day (BID) | ORAL | 0 refills | Status: AC

## 2022-11-09 MED ORDER — ACETAMINOPHEN 325 MG PO TABS: 650.0000 mg | ORAL_TABLET | Freq: Four times a day (QID) | ORAL | 0 refills | Status: AC | PRN

## 2022-11-09 NOTE — Progress Notes (Signed)
PROGRESS NOTE    Dana Morrison  ZOX:[id number] DOB: January 20, 1924 DOA: 11/06/2022 PCP: Charlane Ferretti, DO   Brief Narrative:  89 y.o. female with medical history significant of afib on Eliquis which has been on hold due to prior GI bleed, chronic diastolic CHF, CAD, and HTN presenting with a fall.  She was last admitted from 4/20-23 with acute on chronic diastolic CHF and prior admission in 08/2022 for R hip fracture and subsequent 09/2022 admission for GI bleeding.    She reports that she got up overnight and balanced herself on the arm of the chair and then fell to the floor, landing on her R hip.  Upon admission found to have periprosthetic fracture.  11/08/2022: Patient seen.  No new complaints.  Low blood pressure is noted.  Medications reviewed.  Cautious use of opiates.  Significant drop in H/H noted s/p transfusion of 1 unit of packed red blood cells.  Will continue to monitor H/H every 8 hours x 2.  Monitor blood pressure closely.  Further management depend on hospital course.  11/09/2022: Patient seen.  No new complaints.  Blood pressure is improving.  Systolic blood pressures in the 90s.  CBC done earlier today revealed hemoglobin of 7.3 g/dL.  Likely discharge tomorrow if patient's blood pressure remains stable and hemoglobin remains stable.   Assessment & Plan:  Active Problems:   Paroxysmal atrial fibrillation (HCC)   Chronic combined systolic (congestive) and diastolic (congestive) heart failure (HCC)   Essential hypertension   Pacemaker   Closed right hip fracture, initial encounter (HCC)   Hyperlipidemia   Chronic kidney disease, stage 3a (HCC)   Chronic cough   DNR (do not resuscitate)     Right hip periprosthetic fracture Status post IM nailing of the right femur and removal of deep implant 4/30 Right hip periprosthetic fracture, seen by orthopedic.  Pain control, postop recommendations, pain management and DVT prophylaxis per their service 11/08/2022: Orthopedic team is  driving postop management.  Low blood pressure is noted.  Drop in H/H is noted.  Transfuse packed red blood cells as needed. 11/09/2022: Hemoglobin is 7.3 g/dL today.  Repeat hemoglobin tomorrow.  Systolic blood pressures in the 90s.  Monitor overnight and if patient's vitals remained stable, will likely discharge patient.  Chronic combined heart failure, EF 40% with global hypokinesia -4/21 Echocardiogram with reduced LV systolic function 35 to 40%, global hypokinesis, no significant valvular disease, grade 1 diastolic dysfunction.  Overall appears to be compensated at this time.  Continue Toprol-XL, Imdur.  Hold Aldactone 11/08/2022: Low EF may be contributing to the low blood pressure/hypotension. 11/09/2022: Compensated.   Paroxysmal atrial fibrillation Continue Toprol-XL.  Has pacemaker.  Anticoagulation stopped due to recent GI bleed, follow-up outpatient  AKI on CKD stage 3a Creatinine stable around 1.0.  This morning 1.43.  Hold Aldactone 11/08/2022: BMP revealed sodium of 133, potassium of 4.1, chloride 100, CO2 25, BUN of 29 with serum creatinine of 1.1.  eGFR is 46 mL/min per 1.73 m.   Essential hypertension -Continue Toprol XL, IV as needed 11/08/2022: Low blood pressures noted.  Reviewed antihypertensives.  Reviewed medications that can drop blood pressure.  Cautious use of opiates. 11/09/2022: Patient is currently running low normal blood pressure.  Monitor blood pressure overnight.   Hyperlipidemia -Continue pravastatin     Chronic cough -Seen by pulm on 4/26 -CT with air trapping and minimal scarring, PFTs with air trapping -Bronchodilators, I-S/flutter valve  Anemia: -Likely blood loss anemia. -Hemoglobin on presentation was 9.8, dropped  to 6.5 g/dL.  After 1 unit of packed red blood cell transfusion, repeat hemoglobin was 7.4 g/dL. -Continue to monitor H/H. 11/09/2022: Repeat CBC in the morning.   DNR -I have discussed code status with the patient and her son and they are in  agreement that the patient would not desire resuscitation and would prefer to die a natural death should that situation arise.    DVT prophylaxis: Lovenox Code Status: DNR Family Communication:   Status is: Inpatient Postop management per orthopedic Awaiting renal function to improve.  Pending PT/OT       Diet Orders (From admission, onward)     Start     Ordered   11/06/22 1437  Diet regular Room service appropriate? Yes; Fluid consistency: Thin  Diet effective now       Question Answer Comment  Room service appropriate? Yes   Fluid consistency: Thin      11/06/22 1436            Subjective: Patient seen. No new complaints. No fever or chills No shortness of breath. No chest pain.     Examination:  General exam: Appears calm and comfortable, elderly frail Respiratory system: Clear to auscultation.  Cardiovascular system: S1 & S2 heard. Gastrointestinal system: Abdomen is soft and nontender.   Central nervous system: Awake and alert.  No focal neurological deficits.  Objective: Vitals:   11/09/22 0208 11/09/22 0600 11/09/22 0815 11/09/22 1305  BP: (!) 97/48 (!) 98/50 90/71 (!) 94/46  Pulse: 81 81 80 86  Resp: 18  18 18   Temp: 98.4 F (36.9 C) 98.1 F (36.7 C) 98.4 F (36.9 C) 98.6 F (37 C)  TempSrc: Oral Oral Oral Oral  SpO2: 100% 100% 97% 99%  Weight:      Height:        Intake/Output Summary (Last 24 hours) at 11/09/2022 1315 Last data filed at 11/09/2022 1610 Gross per 24 hour  Intake 572 ml  Output 1300 ml  Net -728 ml    Filed Weights   11/06/22 0524  Weight: 80.7 kg    Scheduled Meds:  sodium chloride   Intravenous Once   acetaminophen  650 mg Oral Q6H   aspirin  81 mg Oral Daily   docusate sodium  100 mg Oral BID   enoxaparin (LOVENOX) injection  30 mg Subcutaneous Q24H   feeding supplement  237 mL Oral BID BM   isosorbide mononitrate  30 mg Oral Daily   metoprolol succinate  25 mg Oral Daily   mometasone-formoterol  2 puff  Inhalation BID   multivitamin  1 tablet Oral QHS   pantoprazole  40 mg Oral Daily   pravastatin  20 mg Oral Daily   Continuous Infusions:    Nutritional status Signs/Symptoms: estimated needs Interventions: Ensure Enlive (each supplement provides 350kcal and 20 grams of protein), MVI Body mass index is 27.88 kg/m.  Data Reviewed:   CBC: Recent Labs  Lab 11/06/22 0535 11/07/22 0141 11/08/22 0439 11/08/22 1351 11/08/22 2143 11/09/22 0413  WBC 11.6* 6.9 11.6*  --   --  8.0  NEUTROABS 8.6*  --   --   --   --   --   HGB 9.5* 9.8* 6.5* 7.4* 6.7* 7.3*  HCT 29.8* 31.5* 19.8* 22.5* 20.3* 22.5*  MCV 81.4 82.5 79.8*  --   --  78.9*  PLT 279 186 204  --   --  162    Basic Metabolic Panel: Recent Labs  Lab 11/06/22 0535 11/07/22  0141 11/08/22 0439 11/09/22 0413  NA 140 132* 132* 132*  K 3.8 4.1 4.1 4.1  CL 104 97* 100 101  CO2 22 22 25 25   GLUCOSE 144* 149* 118* 95  BUN 21 27* 29* 25*  CREATININE 1.21* 1.43* 1.10* 0.88  CALCIUM 8.7* 7.9* 7.8* 7.4*  PHOS  --   --   --  3.0    GFR: Estimated Creatinine Clearance: 40.8 mL/min (by C-G formula based on SCr of 0.88 mg/dL). Liver Function Tests: Recent Labs  Lab 11/09/22 0413  ALBUMIN 2.0*   No results for input(s): "LIPASE", "AMYLASE" in the last 168 hours. No results for input(s): "AMMONIA" in the last 168 hours. Coagulation Profile: Recent Labs  Lab 11/06/22 0535  INR 1.1    Cardiac Enzymes: No results for input(s): "CKTOTAL", "CKMB", "CKMBINDEX", "TROPONINI" in the last 168 hours. BNP (last 3 results) No results for input(s): "PROBNP" in the last 8760 hours. HbA1C: No results for input(s): "HGBA1C" in the last 72 hours. CBG: No results for input(s): "GLUCAP" in the last 168 hours. Lipid Profile: No results for input(s): "CHOL", "HDL", "LDLCALC", "TRIG", "CHOLHDL", "LDLDIRECT" in the last 72 hours. Thyroid Function Tests: No results for input(s): "TSH", "T4TOTAL", "FREET4", "T3FREE", "THYROIDAB" in the  last 72 hours. Anemia Panel: No results for input(s): "VITAMINB12", "FOLATE", "FERRITIN", "TIBC", "IRON", "RETICCTPCT" in the last 72 hours. Sepsis Labs: No results for input(s): "PROCALCITON", "LATICACIDVEN" in the last 168 hours.  Recent Results (from the past 240 hour(s))  Surgical pcr screen     Status: None   Collection Time: 11/06/22 10:01 AM   Specimen: Nasal Mucosa; Nasal Swab  Result Value Ref Range Status   MRSA, PCR NEGATIVE NEGATIVE Final   Staphylococcus aureus NEGATIVE NEGATIVE Final    Comment: (NOTE) The Xpert SA Assay (FDA approved for NASAL specimens in patients 34 years of age and older), is one component of a comprehensive surveillance program. It is not intended to diagnose infection nor to guide or monitor treatment. Performed at [facility], [address] [place], [place] [address]          Radiology Studies: No results found.         LOS: 3 days   Time spent= 35 mins    Barnetta Chapel, MD [facility]  If 7PM-7AM, please contact night-coverage  11/09/2022, 1:15 PM

## 2022-11-09 NOTE — Discharge Instructions (Addendum)
Orthopaedic Trauma Service Discharge Instructions   General Discharge Instructions  Orthopaedic Injuries:  Right proximal femoral shaft fracture treated with removal of hardware and intramedullary nailing of right femur  WEIGHT BEARING STATUS: Touchdown weightbearing right leg with walker  RANGE OF MOTION/ACTIVITY:unrestricted ROM R hip and knee   Bone health: continue with vitamin d and calcium supplements  Review the following resource for additional information regarding bone health  [email or web address]  Wound Care: daily wound care as needed starting on 11/11/2022   Discharge Wound Care Instructions  Do NOT apply any ointments, solutions or lotions to pin sites or surgical wounds.  These prevent needed drainage and even though solutions like hydrogen peroxide kill bacteria, they also damage cells lining the pin sites that help fight infection.  Applying lotions or ointments can keep the wounds moist and can cause them to breakdown and open up as well. This can increase the risk for infection. When in doubt call the office.  Surgical incisions should be dressed daily.  If any drainage is noted, use one layer of adaptic or Mepitel, then gauze and tape.  Alternatively you can use a silicone foam dressing such as a Mepilex  NetCamper.cz [email or web address]://[email or web address]-soto.com/?pd_rd_[email or web address]&th=1  [email or web address].com/  These dressing supplies should be available at local medical supply stores ([employer], [employer], etc). They are not usually carried at places like [facility], [facility], [facility], etc  Once the incision is completely dry and without drainage, it may be left open to air out.  Showering may begin 36-48 hours later.  Cleaning gently with soap and  water.    DVT/PE prophylaxis:eliquis 2.5 mg tablets by mouth every 12 hours x 21 days for blood clot prevention   Diet: as you were eating previously.  Can use over the counter stool softeners and bowel preparations, such as Miralax, to help with bowel movements.  Narcotics can be constipating.  Be sure to drink plenty of fluids  PAIN MEDICATION USE AND EXPECTATIONS  You have likely been given narcotic medications to help control your pain.  After a traumatic event that results in an fracture (broken bone) with or without surgery, it is ok to use narcotic pain medications to help control one's pain.  We understand that everyone responds to pain differently and each individual patient will be evaluated on a regular basis for the continued need for narcotic medications. Ideally, narcotic medication use should last no more than 6-8 weeks (coinciding with fracture healing).   As a patient it is your responsibility as well to monitor narcotic medication use and report the amount and frequency you use these medications when you come to your office visit.   We would also advise that if you are using narcotic medications, you should take a dose prior to therapy to maximize you participation.  IF YOU ARE ON NARCOTIC MEDICATIONS IT IS NOT PERMISSIBLE TO OPERATE A MOTOR VEHICLE (MOTORCYCLE/CAR/TRUCK/MOPED) OR HEAVY MACHINERY DO NOT MIX NARCOTICS WITH OTHER CNS (CENTRAL NERVOUS SYSTEM) DEPRESSANTS SUCH AS ALCOHOL   POST-OPERATIVE OPIOID TAPER INSTRUCTIONS: It is important to wean off of your opioid medication as soon as possible. If you do not need pain medication after your surgery it is ok to stop day one. Opioids include: Codeine, Hydrocodone(Norco, Vicodin), Oxycodone(Percocet, oxycontin) and hydromorphone amongst others.  Long term and even short term use of opiods can cause: Increased pain response Dependence Constipation Depression Respiratory depression And more.  Withdrawal symptoms can  include Flu like symptoms Nausea, vomiting And more Techniques to manage these symptoms  Hydrate well Eat regular healthy meals Stay active Use relaxation techniques(deep breathing, meditating, yoga) Do Not substitute Alcohol to help with tapering If you have been on opioids for less than two weeks and do not have pain than it is ok to stop all together.  Plan to wean off of opioids This plan should start within one week post op of your fracture surgery  Maintain the same interval or time between taking each dose and first decrease the dose.  Cut the total daily intake of opioids by one tablet each day Next start to increase the time between doses. The last dose that should be eliminated is the evening dose.    STOP SMOKING OR USING NICOTINE PRODUCTS!!!!  As discussed nicotine severely impairs your body's ability to heal surgical and traumatic wounds but also impairs bone healing.  Wounds and bone heal by forming microscopic blood vessels (angiogenesis) and nicotine is a vasoconstrictor (essentially, shrinks blood vessels).  Therefore, if vasoconstriction occurs to these microscopic blood vessels they essentially disappear and are unable to deliver necessary nutrients to the healing tissue.  This is one modifiable factor that you can do to dramatically increase your chances of healing your injury.    (This means no smoking, no nicotine gum, patches, etc)  DO NOT USE NONSTEROIDAL ANTI-INFLAMMATORY DRUGS (NSAID'S)  Using products such as Advil (ibuprofen), Aleve (naproxen), Motrin (ibuprofen) for additional pain control during fracture healing can delay and/or prevent the healing response.  If you would like to take over the counter (OTC) medication, Tylenol (acetaminophen) is ok.  However, some narcotic medications that are given for pain control contain acetaminophen as well. Therefore, you should not exceed more than 4000 mg of tylenol in a day if you do not have liver disease.  Also note  that there are may OTC medicines, such as cold medicines and allergy medicines that my contain tylenol as well.  If you have any questions about medications and/or interactions please ask your doctor/PA or your pharmacist.      ICE AND ELEVATE INJURED/OPERATIVE EXTREMITY  Using ice and elevating the injured extremity above your heart can help with swelling and pain control.  Icing in a pulsatile fashion, such as 20 minutes on and 20 minutes off, can be followed.    Do not place ice directly on skin. Make sure there is a barrier between to skin and the ice pack.    Using frozen items such as frozen peas works well as the conform nicely to the are that needs to be iced.  USE AN ACE WRAP OR TED HOSE FOR SWELLING CONTROL  In addition to icing and elevation, Ace wraps or TED hose are used to help limit and resolve swelling.  It is recommended to use Ace wraps or TED hose until you are informed to stop.    When using Ace Wraps start the wrapping distally (farthest away from the body) and wrap proximally (closer to the body)   Example: If you had surgery on your leg or thing and you do not have a splint on, start the ace wrap at the toes and work your way up to the thigh        If you had surgery on your upper extremity and do not have a splint on, start the ace wrap at your fingers and work your way up to the upper arm  IF YOU ARE IN A SPLINT OR CAST DO NOT REMOVE IT FOR ANY REASON   If your  splint gets wet for any reason please contact the office immediately. You may shower in your splint or cast as long as you keep it dry.  This can be done by wrapping in a cast cover or garbage back (or similar)  Do Not stick any thing down your splint or cast such as pencils, money, or hangers to try and scratch yourself with.  If you feel itchy take benadryl as prescribed on the bottle for itching  IF YOU ARE IN A CAM BOOT (BLACK BOOT)  You may remove boot periodically. Perform daily dressing changes as noted  below.  Wash the liner of the boot regularly and wear a sock when wearing the boot. It is recommended that you sleep in the boot until told otherwise    Call office for the following: Temperature greater than 101F Persistent nausea and vomiting Severe uncontrolled pain Redness, tenderness, or signs of infection (pain, swelling, redness, odor or green/yellow discharge around the site) Difficulty breathing, headache or visual disturbances Hives Persistent dizziness or light-headedness Extreme fatigue Any other questions or concerns you may have after discharge  In an emergency, call 911 or go to an Emergency Department at a nearby hospital  HELPFUL INFORMATION  If you had a block, it will wear off between 8-24 hrs postop typically.  This is period when your pain may go from nearly zero to the pain you would have had postop without the block.  This is an abrupt transition but nothing dangerous is happening.  You may take an extra dose of narcotic when this happens.  You should wean off your narcotic medicines as soon as you are able.  Most patients will be off or using minimal narcotics before their first postop appointment.   We suggest you use the pain medication the first night prior to going to bed, in order to ease any pain when the anesthesia wears off. You should avoid taking pain medications on an empty stomach as it will make you nauseous.  Do not drink alcoholic beverages or take illicit drugs when taking pain medications.  In most states it is against the law to drive while you are in a splint or sling.  And certainly against the law to drive while taking narcotics.  You may return to work/school in the next couple of days when you feel up to it.   Pain medication may make you constipated.  Below are a few solutions to try in this order: Decrease the amount of pain medication if you aren't having pain. Drink lots of decaffeinated fluids. Drink prune juice and/or each dried  prunes  If the first 3 don't work start with additional solutions Take Colace - an over-the-counter stool softener Take Senokot - an over-the-counter laxative Take Miralax - a stronger over-the-counter laxative     CALL THE OFFICE WITH ANY QUESTIONS OR CONCERNS: [phone]   VISIT OUR WEBSITE FOR ADDITIONAL INFORMATION: [email or web address]

## 2022-11-09 NOTE — TOC Progression Note (Addendum)
Transition of Care [facility]) - Progression Note    Patient Details  Name: Dana Morrison MRN: [id number] Date of Birth: [birth year]  Transition [facility]) CM/SW Contact  Lorri Frederick, LCSW Phone Number: 11/09/2022, 10:09 AM  Clinical Narrative:   CSW spoke with pt son Gala Romney, they want to accept offer at [facility].  Waiting on confirmation from [facility]/Shannon Gray.  1020: Confirmation from [facility] Bridegroom, can accept today.  Facility choice sent to [place].  Still pending auth.  Navi requiring MD note uploaded confirming pt is stable for DC.  MD aware.    1345: Per MD, potentially stable tomorrow.  Expected Discharge Plan: Skilled Nursing Facility Barriers to Discharge: Continued Medical Work up, SNF Pending bed offer  Expected Discharge Plan and Services In-house Referral: Clinical Social Work   Post Acute Care Choice: Skilled Nursing Facility Living arrangements for the past 2 months: Independent Living Facility Risk manager)                                       Social Determinants of Health (SDOH) Interventions SDOH Screenings   Food Insecurity: No Food Insecurity (08/22/2022)  Housing: Low Risk  (08/22/2022)  Transportation Needs: Unknown (08/22/2022)  Utilities: Not At Risk (08/22/2022)  Tobacco Use: Medium Risk (11/07/2022)    Readmission Risk Interventions     No data to display

## 2022-11-09 NOTE — Progress Notes (Signed)
Orthopaedic Trauma Service Progress Note  Patient ID: Imagean Lagorio MRN: [id number] DOB/AGE: [birth year] 89 y.o.  Subjective:  Pain better today  Ate almost all of  her breakfast  No other complaints   Got another unit of blood this am   ROS As above  Objective:   VITALS:   Vitals:   11/09/22 0000 11/09/22 0208 11/09/22 0600 11/09/22 0815  BP:  (!) 97/48 (!) 98/50 90/71  Pulse:  81 81 80  Resp:  18  18  Temp:  98.4 F (36.9 C) 98.1 F (36.7 C) 98.4 F (36.9 C)  TempSrc:  Oral Oral Oral  SpO2: 100% 100% 100% 97%  Weight:      Height:        Estimated body mass index is 27.88 kg/m as calculated from the following:   Height as of this encounter: 5\' 7"  (1.702 m).   Weight as of this encounter: 80.7 kg.   Intake/Output      05/02 0701 05/03 0700 05/03 0701 05/04 0700   P.O. 720    Blood 647    Total Intake(mL/kg) 1367 (16.9)    Urine (mL/kg/hr) 1300 (0.7)    Stool 0    Total Output 1300    Net +67         Urine Occurrence 3 x    Stool Occurrence 0 x      LABS  Results for orders placed or performed during the hospital encounter of 11/06/22 (from the past 24 hour(s))  Hemoglobin and hematocrit, blood     Status: Abnormal   Collection Time: 11/08/22  1:51 PM  Result Value Ref Range   Hemoglobin 7.4 (L) 12.0 - 15.0 g/dL   HCT 62.1 (L) 30.8 - 65.7 %  Hemoglobin and hematocrit, blood     Status: Abnormal   Collection Time: 11/08/22  9:43 PM  Result Value Ref Range   Hemoglobin 6.7 (LL) 12.0 - 15.0 g/dL   HCT 84.6 (L) 96.2 - 95.2 %  Prepare RBC (crossmatch)     Status: None   Collection Time: 11/08/22 10:15 PM  Result Value Ref Range   Order Confirmation      ORDER PROCESSED BY BLOOD BANK Performed at [facility], [address], [place], [place] [address]   CBC     Status: Abnormal   Collection Time: 11/09/22  4:13 AM  Result Value Ref Range   WBC 8.0 4.0 - 10.5 K/uL    RBC 2.85 (L) 3.87 - 5.11 MIL/uL   Hemoglobin 7.3 (L) 12.0 - 15.0 g/dL   HCT 44.0 (L) 10.2 - 72.5 %   MCV 78.9 (L) 80.0 - 100.0 fL   MCH 25.6 (L) 26.0 - 34.0 pg   MCHC 32.4 30.0 - 36.0 g/dL   RDW 36.6 (H) 44.0 - 34.7 %   Platelets 162 150 - 400 K/uL   nRBC 0.0 0.0 - 0.2 %  Renal function panel     Status: Abnormal   Collection Time: 11/09/22  4:13 AM  Result Value Ref Range   Sodium 132 (L) 135 - 145 mmol/L   Potassium 4.1 3.5 - 5.1 mmol/L   Chloride 101 98 - 111 mmol/L   CO2 25 22 - 32 mmol/L   Glucose, Bld 95 70 - 99 mg/dL   BUN 25 (  H) 8 - 23 mg/dL   Creatinine, Ser 1.61 0.44 - 1.00 mg/dL   Calcium 7.4 (L) 8.9 - 10.3 mg/dL   Phosphorus 3.0 2.5 - 4.6 mg/dL   Albumin 2.0 (L) 3.5 - 5.0 g/dL   GFR, Estimated >09 >60 mL/min   Anion gap 6 5 - 15     PHYSICAL EXAM:   Gen: resting in bed,  NAD, pleasant   Lungs: unlabored Ext:       Right Lower Extremity              Dressings R thigh are clean, dry and intact             Ext warm              Mild swelling             + DP pulse             No DCT             Compartments are soft             EHL, FHL, lesser toe motor intact             Ankle flexion, extension, inversion and eversion intact  Assessment/Plan: 3 Days Post-Op     Anti-infectives (From admission, onward)    Start     Dose/Rate Route Frequency Ordered Stop   11/06/22 1800  ceFAZolin (ANCEF) IVPB 2g/100 mL premix        2 g 200 mL/hr over 30 Minutes Intravenous Every 6 hours 11/06/22 1436 11/07/22 0043   11/06/22 0945  ceFAZolin (ANCEF) IVPB 2g/100 mL premix        2 g 200 mL/hr over 30 Minutes Intravenous To Surgery 11/06/22 0935 11/06/22 1129     .  POD/HD#: 47  89 y/o female s/p fall with R proximal femoral shaft fracture, retained R femoral neck screws    -R proximal femoral shaft fracture with retained R femoral neck screws s/p ROH and IMN R femur  Weightbearing TDWB R leg with assistance. Will likely advance at first post op visit                ROM/Activity                         ROM as tolerated R hip and knee                         Slowly increase activity level               Wound care                         Dressing changes as needed    - Pain management:             Multimodal             Minimize narcotics   - ABL anemia/Hemodynamics             transfused with another unit PRBCs today                  - Medical issues              Per medicine   - DVT/PE prophylaxis:             Lovenox and scds  Dc on eliquis 2.5 mg po BID x 21 days    - ID:              Periop abx completed    - Activity:             As above   - FEN/GI prophylaxis/Foley/Lines:             Reg diet   - Dispo:             ortho issues stable  Follow up with ortho in 10-14 days   Rxs on chart   Mearl Latin, PA-C [phone] (C) 11/09/2022, 10:15 AM  [facility] [address] [place] [address] [phone] [phone] (F)    After 5pm and on the weekends please log on to Amion, go to orthopaedics and the look under the [facility] Call for the provider(s) on call. You can also call our office at [phone] and then follow the prompts to be connected to the call team.  Patient ID: Minerva Areola, female   DOB: 12-Sep-1923, 89 y.o.   MRN: [id number]

## 2022-11-09 NOTE — Plan of Care (Signed)
  Problem: Education: Goal: Knowledge of General Education information will improve Description: Including pain rating scale, medication(s)/side effects and non-pharmacologic comfort measures Outcome: Progressing   Problem: Health Behavior/Discharge Planning: Goal: Ability to manage health-related needs will improve Outcome: Progressing   Problem: Clinical Measurements: Goal: Ability to maintain clinical measurements within normal limits will improve Outcome: Progressing Goal: Will remain free from infection Outcome: Progressing Goal: Respiratory complications will improve Outcome: Progressing Goal: Cardiovascular complication will be avoided Outcome: Progressing   Problem: Activity: Goal: Risk for activity intolerance will decrease Outcome: Progressing   Problem: Nutrition: Goal: Adequate nutrition will be maintained Outcome: Progressing   Problem: Elimination: Goal: Will not experience complications related to bowel motility Outcome: Progressing Goal: Will not experience complications related to urinary retention Outcome: Progressing   Problem: Coping: Goal: Level of anxiety will decrease Outcome: Progressing   Problem: Pain Managment: Goal: General experience of comfort will improve Outcome: Progressing

## 2022-11-09 NOTE — Progress Notes (Signed)
Physical Therapy Treatment Patient Details Name: Dana Morrison MRN: [id number] DOB: May 05, 1924 Today's Date: 11/09/2022   History of Present Illness Pt is a 89 y.o. female presenting 4/30 after a fall at her [facility] with resulting R hip pain. Now s/p intramedullary nailing R femur and removal of deep implant. Recent admission for R femoral neck fracture and ORIF for management. PMHx:  osteoporosis, HOH, a-fib, CHFN CAD, low vision, MI HTN, OA hip and knee, urticaria, venous insufficiency    PT Comments    Pt received in supine and agreeable to session. Pt with improved ability to advance BLE to EOB and elevate trunk, requiring up to min A.  Pt with increased pain once sitting EOB. Pt stating that she does not recall being educated on WB precautions despite previous PT note, so pt educated again and verbalizes understanding. Attempting to stand from EOB with pt requiring max A, however pt unable to maintain RLE TDWB despite cues and therapist's foot underneath pt's R foot.  Pt declining lateral transfer to recliner and requiring increased assist to return to supine due to increased pain. Pt continues to benefit from PT services to progress toward functional mobility goals.     Recommendations for follow up therapy are one component of a multi-disciplinary discharge planning process, led by the attending physician.  Recommendations may be updated based on patient status, additional functional criteria and insurance authorization.     Assistance Recommended at Discharge Frequent or constant Supervision/Assistance  Patient can return home with the following Two people to help with walking and/or transfers;Two people to help with bathing/dressing/bathroom;Help with stairs or ramp for entrance;Assist for transportation;Assistance with cooking/housework   Equipment Recommendations  None recommended by PT    Recommendations for Other Services       Precautions / Restrictions Precautions Precautions:  Fall Restrictions Weight Bearing Restrictions: Yes RLE Weight Bearing: Weight bearing as tolerated     Mobility  Bed Mobility Overal bed mobility: Needs Assistance Bed Mobility: Supine to Sit, Sit to Supine     Supine to sit: Min assist, HOB elevated Sit to supine: Max assist, +2 for physical assistance   General bed mobility comments: Pt able to advance BLE to EOB and use bedrail for scooting and requiring min A for trunk elevation. Max A +2 for BLE elevation and trunk descent due to increased pain    Transfers Overall transfer level: Needs assistance Equipment used: Rolling walker (2 wheels) Transfers: Sit to/from Stand Sit to Stand: Max assist, From elevated surface           General transfer comment: Pt able to stand x2 from EOB with max A for power up, RW stabilization, and therapists foot under RLE to maintain TDWB. Pt with difficulty maintaining TDWB and pulling up on RW rather than pushing despite cues. Pt declined lateral scoot to recliner due to increased pain.         Balance Overall balance assessment: Needs assistance Sitting-balance support: Bilateral upper extremity supported, Feet supported Sitting balance-Leahy Scale: Fair Sitting balance - Comments: sitting EOB     Standing balance-Leahy Scale: Zero Standing balance comment: Pt requiring assist to maintain upright posture                            Cognition Arousal/Alertness: Awake/alert Behavior During Therapy: WFL for tasks assessed/performed Overall Cognitive Status: Impaired/Different from baseline  General Comments: Pt distracted by pain during mobility tasks causing decreased command following        Exercises Total Joint Exercises Heel Slides: AROM, AAROM, Supine, Both, 5 reps    General Comments        Pertinent Vitals/Pain Pain Assessment Pain Assessment: 0-10 Pain Score: 8  Pain Location: R hip with mobility Pain  Descriptors / Indicators: Aching, Sore, Operative site guarding, Crying Pain Intervention(s): Monitored during session, Repositioned     PT Goals (current goals can now be found in the care plan section) Acute Rehab PT Goals Patient Stated Goal: decreased pain PT Goal Formulation: With patient Time For Goal Achievement: 11/21/22 Potential to Achieve Goals: Fair Progress towards PT goals: Progressing toward goals    Frequency    Min 3X/week      PT Plan Current plan remains appropriate       AM-PAC PT "6 Clicks" Mobility   Outcome Measure  Help needed turning from your back to your side while in a flat bed without using bedrails?: A Little Help needed moving from lying on your back to sitting on the side of a flat bed without using bedrails?: A Little Help needed moving to and from a bed to a chair (including a wheelchair)?: A Lot Help needed standing up from a chair using your arms (e.g., wheelchair or bedside chair)?: Total Help needed to walk in hospital room?: Total Help needed climbing 3-5 steps with a railing? : Total 6 Click Score: 11    End of Session Equipment Utilized During Treatment: Oxygen;Gait belt Activity Tolerance: Patient limited by pain Patient left: with call bell/phone within reach;with family/visitor present;in bed Nurse Communication: Mobility status PT Visit Diagnosis: Pain;Difficulty in walking, not elsewhere classified (R26.2);Other abnormalities of gait and mobility (R26.89) Pain - Right/Left: Right Pain - part of body: Hip;Leg     Time: 1534-1600 PT Time Calculation (min) (ACUTE ONLY): 26 min  Charges:  $Therapeutic Activity: 23-37 mins                     Johny Shock, PTA Acute Rehabilitation Services Secure Chat Preferred  [phone]    Johny Shock 11/09/2022, 4:16 PM

## 2022-11-09 NOTE — TOC Progression Note (Signed)
Transition of Care [facility]) - Progression Note    Patient Details  Name: Dana Morrison MRN: [id number] Date of Birth: 06-03-1924  Transition of Care [facility]) CM/SW Contact  Lockie Pares, RN Phone Number: 11/09/2022, 3:47 PM  Clinical Narrative:    Eber Jones from [facility] made aware that [name] will be going to SNF upon DC. They were he home health provider.   Expected Discharge Plan: Skilled Nursing Facility Barriers to Discharge: Continued Medical Work up, SNF Pending bed offer  Expected Discharge Plan and Services In-house Referral: Clinical Social Work   Post Acute Care Choice: Skilled Nursing Facility Living arrangements for the past 2 months: Independent Living Facility Risk manager)                                       Social Determinants of Health (SDOH) Interventions SDOH Screenings   Food Insecurity: No Food Insecurity (08/22/2022)  Housing: Low Risk  (08/22/2022)  Transportation Needs: Unknown (08/22/2022)  Utilities: Not At Risk (08/22/2022)  Tobacco Use: Medium Risk (11/07/2022)    Readmission Risk Interventions     No data to display

## 2022-11-10 ENCOUNTER — Other Ambulatory Visit: Payer: Self-pay | Admitting: Cardiovascular Disease

## 2022-11-10 DIAGNOSIS — S72001A Fracture of unspecified part of neck of right femur, initial encounter for closed fracture: Secondary | ICD-10-CM | POA: Diagnosis not present

## 2022-11-10 LAB — CBC WITH DIFFERENTIAL/PLATELET
Abs Immature Granulocytes: 0.06 10*3/uL (ref 0.00–0.07)
Basophils Absolute: 0 10*3/uL (ref 0.0–0.1)
Basophils Relative: 0 %
Eosinophils Absolute: 0.1 10*3/uL (ref 0.0–0.5)
Eosinophils Relative: 1 %
HCT: 24.5 % — ABNORMAL LOW (ref 36.0–46.0)
Hemoglobin: 8 g/dL — ABNORMAL LOW (ref 12.0–15.0)
Immature Granulocytes: 1 %
Lymphocytes Relative: 10 %
Lymphs Abs: 0.7 10*3/uL (ref 0.7–4.0)
MCH: 26.1 pg (ref 26.0–34.0)
MCHC: 32.7 g/dL (ref 30.0–36.0)
MCV: 79.8 fL — ABNORMAL LOW (ref 80.0–100.0)
Monocytes Absolute: 0.7 10*3/uL (ref 0.1–1.0)
Monocytes Relative: 10 %
Neutro Abs: 5.4 10*3/uL (ref 1.7–7.7)
Neutrophils Relative %: 78 %
Platelets: 207 10*3/uL (ref 150–400)
RBC: 3.07 MIL/uL — ABNORMAL LOW (ref 3.87–5.11)
RDW: 18.2 % — ABNORMAL HIGH (ref 11.5–15.5)
WBC: 7 10*3/uL (ref 4.0–10.5)
nRBC: 0 % (ref 0.0–0.2)

## 2022-11-10 NOTE — Progress Notes (Signed)
Physical Therapy Treatment Patient Details Name: Dana Morrison MRN: [id number] DOB: 09-Nov-1923 Today's Date: 11/10/2022   History of Present Illness Pt is a 89 y.o. female presenting 4/30 after a fall at her ILF with resulting R hip pain. Now s/p intramedullary nailing R femur and removal of deep implant. Recent admission for R femoral neck fracture and ORIF for management. PMHx:  osteoporosis, HOH, a-fib, CHFN CAD, low vision, MI HTN, OA hip and knee, urticaria, venous insufficiency    PT Comments    Pt received in supine and agreeable to session. Pt continuing to be limited by R hip pain and deferring lateral transfer to the recliner due to not wanting increased pain when family visits today. Pt able to sit EOB and perform a few LAQ, however reporting increased pain and requesting to return to supine. Pt able to lateral scoot at EOB with assist to maintain RLE TDWB. Pt's bed linens noted to be soiled with urine once returned to supine, so pt rolling L/R with min A to change them. Pt continues to benefit from PT services to progress toward functional mobility goals.     Recommendations for follow up therapy are one component of a multi-disciplinary discharge planning process, led by the attending physician.  Recommendations may be updated based on patient status, additional functional criteria and insurance authorization.     Assistance Recommended at Discharge Frequent or constant Supervision/Assistance  Patient can return home with the following Two people to help with walking and/or transfers;Two people to help with bathing/dressing/bathroom;Help with stairs or ramp for entrance;Assist for transportation;Assistance with cooking/housework   Equipment Recommendations  None recommended by PT    Recommendations for Other Services       Precautions / Restrictions Precautions Precautions: Fall Restrictions Weight Bearing Restrictions: Yes RLE Weight Bearing: Touchdown weight bearing      Mobility  Bed Mobility Overal bed mobility: Needs Assistance Bed Mobility: Supine to Sit, Sit to Supine, Rolling Rolling: Min assist   Supine to sit: Min assist Sit to supine: Mod assist   General bed mobility comments: Min A for RLE advancement to EOB and mod A to elevate RLE to EOB due to pain. Min A to roll L/R for linen change.    Transfers                   General transfer comment: Pt deferred lateral transfer due to pain and family coming       Balance Overall balance assessment: Needs assistance Sitting-balance support: Bilateral upper extremity supported, Feet supported Sitting balance-Leahy Scale: Fair Sitting balance - Comments: sitting EOB       Standing balance comment: unable to assess                            Cognition Arousal/Alertness: Awake/alert Behavior During Therapy: WFL for tasks assessed/performed Overall Cognitive Status: Within Functional Limits for tasks assessed                                          Exercises General Exercises - Lower Extremity Long Arc Quad: AROM, Seated, Right, 5 reps    General Comments        Pertinent Vitals/Pain Pain Assessment Pain Assessment: Faces Faces Pain Scale: Hurts whole lot Pain Location: R hip with mobility Pain Descriptors / Indicators: Aching, Sore, Operative site guarding, Crying  Pain Intervention(s): Limited activity within patient's tolerance, Monitored during session, Repositioned     PT Goals (current goals can now be found in the care plan section) Acute Rehab PT Goals Patient Stated Goal: decreased pain PT Goal Formulation: With patient Time For Goal Achievement: 11/21/22 Potential to Achieve Goals: Fair Progress towards PT goals: Progressing toward goals    Frequency    Min 3X/week      PT Plan Current plan remains appropriate       AM-PAC PT "6 Clicks" Mobility   Outcome Measure  Help needed turning from your back to your  side while in a flat bed without using bedrails?: A Little Help needed moving from lying on your back to sitting on the side of a flat bed without using bedrails?: A Little Help needed moving to and from a bed to a chair (including a wheelchair)?: A Lot Help needed standing up from a chair using your arms (e.g., wheelchair or bedside chair)?: Total Help needed to walk in hospital room?: Total Help needed climbing 3-5 steps with a railing? : Total 6 Click Score: 11    End of Session Equipment Utilized During Treatment: Oxygen;Gait belt Activity Tolerance: Patient limited by pain Patient left: with call bell/phone within reach;in bed;with bed alarm set Nurse Communication: Mobility status PT Visit Diagnosis: Pain;Difficulty in walking, not elsewhere classified (R26.2);Other abnormalities of gait and mobility (R26.89) Pain - Right/Left: Right Pain - part of body: Hip;Leg     Time: 2130-8657 PT Time Calculation (min) (ACUTE ONLY): 32 min  Charges:  $Therapeutic Activity: 23-37 mins                     Johny Shock, PTA Acute Rehabilitation Services Secure Chat Preferred  [phone]    Johny Shock 11/10/2022, 1:44 PM

## 2022-11-10 NOTE — Plan of Care (Signed)
  Problem: Clinical Measurements: Goal: Will remain free from infection Outcome: Progressing Goal: Diagnostic test results will improve Outcome: Progressing   Problem: Nutrition: Goal: Adequate nutrition will be maintained Outcome: Progressing   Problem: Elimination: Goal: Will not experience complications related to urinary retention Outcome: Progressing   Problem: Safety: Goal: Ability to remain free from injury will improve Outcome: Progressing

## 2022-11-10 NOTE — TOC Progression Note (Signed)
Transition of Care [facility]) - Progression Note    Patient Details  Name: Dana Morrison MRN: [id number] Date of Birth: 22-Aug-1923  Transition [facility] Care [facility]) CM/SW Contact  Donnalee Curry, LCSWA Phone Number: 11/10/2022, 12:12 PM  Clinical Narrative:     SW spoke with Kaleen Odea [facility] [phone]) Berkley Harvey still pending  Updated progress note faxed to: [phone]  Expected Discharge Plan: Skilled Nursing Facility Barriers to Discharge: Continued Medical Work up, SNF Pending bed offer  Expected Discharge Plan and Services In-house Referral: Clinical Social Work   Post Acute Care Choice: Skilled Nursing Facility Living arrangements for the past 2 months: Independent Living Facility Risk manager)                                       Social Determinants of Health (SDOH) Interventions SDOH Screenings   Food Insecurity: No Food Insecurity (08/22/2022)  Housing: Low Risk  (08/22/2022)  Transportation Needs: Unknown (08/22/2022)  Utilities: Not At Risk (08/22/2022)  Tobacco Use: Medium Risk (11/07/2022)    Readmission Risk Interventions     No data to display

## 2022-11-10 NOTE — Progress Notes (Signed)
PROGRESS NOTE    Dana Morrison  ZOX:[id number] DOB: [birth year] DOA: 11/06/2022 PCP: Charlane Ferretti, DO   Brief Narrative:  89 y.o. female with medical history significant of afib on Eliquis which has been on hold due to prior GI bleed, chronic diastolic CHF, CAD, and HTN presenting with a fall.  She was last admitted from 4/20-23 with acute on chronic diastolic CHF and prior admission in 08/2022 for R hip fracture and subsequent 09/2022 admission for GI bleeding.    She reports that she got up overnight and balanced herself on the arm of the chair and then fell to the floor, landing on her R hip.  Upon admission found to have periprosthetic fracture.  11/08/2022: Patient seen.  No new complaints.  Low blood pressure is noted.  Medications reviewed.  Cautious use of opiates.  Significant drop in H/H noted s/p transfusion of 1 unit of packed red blood cells.  Will continue to monitor H/H every 8 hours x 2.  Monitor blood pressure closely.  Further management depend on hospital course.  11/09/2022: Patient seen.  No new complaints.  Blood pressure is improving.  Systolic blood pressures in the 90s.  CBC done earlier today revealed hemoglobin of 7.3 g/dL.  Likely discharge tomorrow if patient's blood pressure remains stable and hemoglobin remains stable.  11/10/2022: Blood pressure is improving.  Hemoglobin this morning is 8 g/dL.  Awaiting insurance authorization.  Patient is stable for discharge to facility.  Patient's family updated.   Assessment & Plan:  Active Problems:   Paroxysmal atrial fibrillation (HCC)   Chronic combined systolic (congestive) and diastolic (congestive) heart failure (HCC)   Essential hypertension   Pacemaker   Closed right hip fracture, initial encounter (HCC)   Hyperlipidemia   Chronic kidney disease, stage 3a (HCC)   Chronic cough   DNR (do not resuscitate)     Right hip periprosthetic fracture Status post IM nailing of the right femur and removal of deep implant  4/30 Right hip periprosthetic fracture, seen by orthopedic.  Pain control, postop recommendations, pain management and DVT prophylaxis per their service 11/08/2022: Orthopedic team is driving postop management.  Low blood pressure is noted.  Drop in H/H is noted.  Transfuse packed red blood cells as needed. 11/09/2022: Hemoglobin is 7.3 g/dL today.  Repeat hemoglobin tomorrow.  Systolic blood pressures in the 90s.  Monitor overnight and if patient's vitals remained stable, will likely discharge patient. 11/10/2022: Stable for discharge to facility.  Pursue disposition.  Awaiting insurance authorization.  Chronic combined heart failure, EF 40% with global hypokinesia -4/21 Echocardiogram with reduced LV systolic function 35 to 40%, global hypokinesis, no significant valvular disease, grade 1 diastolic dysfunction.  Overall appears to be compensated at this time.  Continue Toprol-XL, Imdur.  Hold Aldactone 11/08/2022: Low EF may be contributing to the low blood pressure/hypotension. 11/10/2022: Compensated.   Paroxysmal atrial fibrillation Continue Toprol-XL.  Has pacemaker.  Anticoagulation stopped due to recent GI bleed, follow-up outpatient  AKI on CKD stage 3a Creatinine stable around 1.0.  This morning 1.43.  Hold Aldactone 11/08/2022: BMP revealed sodium of 133, potassium of 4.1, chloride 100, CO2 25, BUN of 29 with serum creatinine of 1.1.  eGFR is 46 mL/min per 1.73 m.   Essential hypertension -Continue Toprol XL, IV as needed 11/08/2022: Low blood pressures noted.  Reviewed antihypertensives.  Reviewed medications that can drop blood pressure.  Cautious use of opiates. 11/09/2022: Patient is currently running low normal blood pressure.  Monitor blood pressure overnight.  Hyperlipidemia -Continue pravastatin     Chronic cough -Seen by pulm on 4/26 -CT with air trapping and minimal scarring, PFTs with air trapping -Bronchodilators, I-S/flutter valve  Anemia: -Likely blood loss  anemia. -Hemoglobin on presentation was 9.8, dropped to 6.5 g/dL.  After 1 unit of packed red blood cell transfusion, repeat hemoglobin was 7.4 g/dL. -Continue to monitor H/H. 11/09/2022: Repeat CBC in the morning.   DNR -I have discussed code status with the patient and her son and they are in agreement that the patient would not desire resuscitation and would prefer to die a natural death should that situation arise.    DVT prophylaxis: Lovenox Code Status: DNR Family Communication:   Status is: Inpatient Postop management per orthopedic Awaiting renal function to improve.  Pending PT/OT       Diet Orders (From admission, onward)     Start     Ordered   11/06/22 1437  Diet regular Room service appropriate? Yes; Fluid consistency: Thin  Diet effective now       Question Answer Comment  Room service appropriate? Yes   Fluid consistency: Thin      11/06/22 1436            Subjective: Patient seen. No new complaints. No fever or chills No shortness of breath. No chest pain.     Examination:  General exam: Appears calm and comfortable, elderly frail Respiratory system: Clear to auscultation.  Cardiovascular system: S1 & S2 heard. Gastrointestinal system: Abdomen is soft and nontender.   Central nervous system: Awake and alert.  No focal neurological deficits.  Objective: Vitals:   11/09/22 2040 11/10/22 0414 11/10/22 0739 11/10/22 0744  BP:  (!) 102/54 (!) 108/53   Pulse: 77 87 88   Resp: 18 (!) 22 20   Temp:   97.8 F (36.6 C)   TempSrc:      SpO2: 97% 100% 99% 97%  Weight:      Height:        Intake/Output Summary (Last 24 hours) at 11/10/2022 1513 Last data filed at 11/10/2022 1337 Gross per 24 hour  Intake 120 ml  Output 500 ml  Net -380 ml    Filed Weights   11/06/22 0524  Weight: 80.7 kg    Scheduled Meds:  sodium chloride   Intravenous Once   acetaminophen  650 mg Oral Q6H   aspirin  81 mg Oral Daily   docusate sodium  100 mg Oral BID    enoxaparin (LOVENOX) injection  40 mg Subcutaneous Q24H   feeding supplement  237 mL Oral BID BM   isosorbide mononitrate  30 mg Oral Daily   metoprolol succinate  25 mg Oral Daily   mometasone-formoterol  2 puff Inhalation BID   multivitamin  1 tablet Oral QHS   pantoprazole  40 mg Oral Daily   pravastatin  20 mg Oral Daily   Continuous Infusions:    Nutritional status Signs/Symptoms: estimated needs Interventions: Ensure Enlive (each supplement provides 350kcal and 20 grams of protein), MVI Body mass index is 27.88 kg/m.  Data Reviewed:   CBC: Recent Labs  Lab 11/06/22 0535 11/07/22 0141 11/08/22 0439 11/08/22 1351 11/08/22 2143 11/09/22 0413 11/09/22 1341 11/09/22 2214 11/10/22 1101  WBC 11.6* 6.9 11.6*  --   --  8.0  --   --  7.0  NEUTROABS 8.6*  --   --   --   --   --   --   --  5.4  HGB  9.5* 9.8* 6.5*   < > 6.7* 7.3* 7.9* 7.1* 8.0*  HCT 29.8* 31.5* 19.8*   < > 20.3* 22.5* 23.6* 21.8* 24.5*  MCV 81.4 82.5 79.8*  --   --  78.9*  --   --  79.8*  PLT 279 186 204  --   --  162  --   --  207   < > = values in this interval not displayed.    Basic Metabolic Panel: Recent Labs  Lab 11/06/22 0535 11/07/22 0141 11/08/22 0439 11/09/22 0413  NA 140 132* 132* 132*  K 3.8 4.1 4.1 4.1  CL 104 97* 100 101  CO2 22 22 25 25   GLUCOSE 144* 149* 118* 95  BUN 21 27* 29* 25*  CREATININE 1.21* 1.43* 1.10* 0.88  CALCIUM 8.7* 7.9* 7.8* 7.4*  PHOS  --   --   --  3.0    GFR: Estimated Creatinine Clearance: 40.8 mL/min (by C-G formula based on SCr of 0.88 mg/dL). Liver Function Tests: Recent Labs  Lab 11/09/22 0413  ALBUMIN 2.0*    No results for input(s): "LIPASE", "AMYLASE" in the last 168 hours. No results for input(s): "AMMONIA" in the last 168 hours. Coagulation Profile: Recent Labs  Lab 11/06/22 0535  INR 1.1    Cardiac Enzymes: No results for input(s): "CKTOTAL", "CKMB", "CKMBINDEX", "TROPONINI" in the last 168 hours. BNP (last 3 results) No results  for input(s): "PROBNP" in the last 8760 hours. HbA1C: No results for input(s): "HGBA1C" in the last 72 hours. CBG: No results for input(s): "GLUCAP" in the last 168 hours. Lipid Profile: No results for input(s): "CHOL", "HDL", "LDLCALC", "TRIG", "CHOLHDL", "LDLDIRECT" in the last 72 hours. Thyroid Function Tests: No results for input(s): "TSH", "T4TOTAL", "FREET4", "T3FREE", "THYROIDAB" in the last 72 hours. Anemia Panel: No results for input(s): "VITAMINB12", "FOLATE", "FERRITIN", "TIBC", "IRON", "RETICCTPCT" in the last 72 hours. Sepsis Labs: No results for input(s): "PROCALCITON", "LATICACIDVEN" in the last 168 hours.  Recent Results (from the past 240 hour(s))  Surgical pcr screen     Status: None   Collection Time: 11/06/22 10:01 AM   Specimen: Nasal Mucosa; Nasal Swab  Result Value Ref Range Status   MRSA, PCR NEGATIVE NEGATIVE Final   Staphylococcus aureus NEGATIVE NEGATIVE Final    Comment: (NOTE) The Xpert SA Assay (FDA approved for NASAL specimens in patients 44 years of age and older), is one component of a comprehensive surveillance program. It is not intended to diagnose infection nor to guide or monitor treatment. Performed at [facility], [address] [place], [place] [address]          Radiology Studies: No results found.         LOS: 4 days   Time spent= 35 mins    Barnetta Chapel, MD [facility]  If 7PM-7AM, please contact night-coverage  11/10/2022, 3:13 PM

## 2022-11-11 DIAGNOSIS — S72001A Fracture of unspecified part of neck of right femur, initial encounter for closed fracture: Secondary | ICD-10-CM | POA: Diagnosis not present

## 2022-11-11 MED ORDER — BUMETANIDE 1 MG PO TABS
1.0000 mg | ORAL_TABLET | Freq: Every day | ORAL | Status: DC
  Administered 2022-11-11 – 2022-11-12 (×2): 1 mg via ORAL
  Filled 2022-11-11 (×4): qty 1

## 2022-11-11 MED ORDER — POLYETHYLENE GLYCOL 3350 17 G PO PACK
17.0000 g | PACK | Freq: Every day | ORAL | Status: DC
  Administered 2022-11-11 – 2022-11-12 (×2): 17 g via ORAL
  Filled 2022-11-11 (×2): qty 1

## 2022-11-11 NOTE — Progress Notes (Signed)
PROGRESS NOTE    Dana Morrison  ZOX:[id number] DOB: 14-Jun-1924 DOA: 11/06/2022 PCP: Charlane Ferretti, DO   Brief Narrative:  89 y.o. female with medical history significant of afib on Eliquis which has been on hold due to prior GI bleed, chronic diastolic CHF, CAD, and HTN presenting with a fall.  She was last admitted from 4/20-23 with acute on chronic diastolic CHF and prior admission in 08/2022 for R hip fracture and subsequent 09/2022 admission for GI bleeding.    She reports that she got up overnight and balanced herself on the arm of the chair and then fell to the floor, landing on her R hip.  Upon admission found to have periprosthetic fracture.  11/08/2022: Patient seen.  No new complaints.  Low blood pressure is noted.  Medications reviewed.  Cautious use of opiates.  Significant drop in H/H noted s/p transfusion of 1 unit of packed red blood cells.  Will continue to monitor H/H every 8 hours x 2.  Monitor blood pressure closely.  Further management depend on hospital course.  11/09/2022: Patient seen.  No new complaints.  Blood pressure is improving.  Systolic blood pressures in the 90s.  CBC done earlier today revealed hemoglobin of 7.3 g/dL.  Likely discharge tomorrow if patient's blood pressure remains stable and hemoglobin remains stable.  11/10/2022: Blood pressure is improving.  Hemoglobin this morning is 8 g/dL.  Awaiting insurance authorization.  Patient is stable for discharge to facility.  Patient's family updated.  11/11/2022: Patient seen.  Blood pressure is better today.  No labs today.  Will check labs in the morning.  Will restart patient's Bumex.  Will use IV Lasix 20 Mg as needed for significant shortness of breath.  Assessment & Plan:  Active Problems:   Paroxysmal atrial fibrillation (HCC)   Chronic combined systolic (congestive) and diastolic (congestive) heart failure (HCC)   Essential hypertension   Pacemaker   Closed right hip fracture, initial encounter (HCC)    Hyperlipidemia   Chronic kidney disease, stage 3a (HCC)   Chronic cough   DNR (do not resuscitate)     Right hip periprosthetic fracture Status post IM nailing of the right femur and removal of deep implant 4/30 Right hip periprosthetic fracture, seen by orthopedic.  Pain control, postop recommendations, pain management and DVT prophylaxis per their service 11/08/2022: Orthopedic team is driving postop management.  Low blood pressure is noted.  Drop in H/H is noted.  Transfuse packed red blood cells as needed. 11/09/2022: Hemoglobin is 7.3 g/dL today.  Repeat hemoglobin tomorrow.  Systolic blood pressures in the 90s.  Monitor overnight and if patient's vitals remained stable, will likely discharge patient. 11/11/2022: Stable for discharge to facility.  Pursue disposition.  Awaiting insurance authorization.  Chronic combined heart failure, EF 40% with global hypokinesia -4/21 Echocardiogram with reduced LV systolic function 35 to 40%, global hypokinesis, no significant valvular disease, grade 1 diastolic dysfunction.  Overall appears to be compensated at this time.  Continue Toprol-XL, Imdur.  Hold Aldactone 11/08/2022: Low EF may be contributing to the low blood pressure/hypotension. 11/10/2022: Compensated. 11/11/2022: Resume Bumex 1 Mg p.o. once daily.  Use IV Lasix 20 Mg as needed for significant shortness of breath.   Paroxysmal atrial fibrillation Continue Toprol-XL.  Has pacemaker.  Anticoagulation stopped due to recent GI bleed, follow-up outpatient  AKI on CKD stage 3a Creatinine stable around 1.0.  This morning 1.43.  Hold Aldactone 11/08/2022: BMP revealed sodium of 133, potassium of 4.1, chloride 100, CO2 25, BUN  of 29 with serum creatinine of 1.1.  eGFR is 46 mL/min per 1.73 m.   Essential hypertension -Continue Toprol XL, IV as needed 11/08/2022: Low blood pressures noted.  Reviewed antihypertensives.  Reviewed medications that can drop blood pressure.  Cautious use of opiates. 11/09/2022:  Patient is currently running low normal blood pressure.  Monitor blood pressure overnight.   Hyperlipidemia -Continue pravastatin     Chronic cough -Seen by pulm on 4/26 -CT with air trapping and minimal scarring, PFTs with air trapping -Bronchodilators, I-S/flutter valve  Anemia: -Likely blood loss anemia. -Hemoglobin on presentation was 9.8, dropped to 6.5 g/dL.  After 1 unit of packed red blood cell transfusion, repeat hemoglobin was 7.4 g/dL. -Continue to monitor H/H. 11/09/2022: Repeat CBC in the morning.   DNR -I have discussed code status with the patient and her son and they are in agreement that the patient would not desire resuscitation and would prefer to die a natural death should that situation arise.    DVT prophylaxis: Lovenox Code Status: DNR Family Communication:   Status is: Inpatient Postop management per orthopedic Awaiting renal function to improve.  Pending PT/OT       Diet Orders (From admission, onward)     Start     Ordered   11/06/22 1437  Diet regular Room service appropriate? Yes; Fluid consistency: Thin  Diet effective now       Question Answer Comment  Room service appropriate? Yes   Fluid consistency: Thin      11/06/22 1436            Subjective: Patient seen. No new complaints. No fever or chills No chest pain.     Examination:  General exam: Appears calm and comfortable, elderly frail Respiratory system: Clear to auscultation.  Cardiovascular system: S1 & S2 heard. Gastrointestinal system: Abdomen is soft and nontender.   Central nervous system: Awake and alert.  No focal neurological deficits. Extremities: Mild ankle edema.  Objective: Vitals:   11/10/22 2031 11/10/22 2037 11/11/22 0726 11/11/22 0848  BP:  98/82 (!) 107/48   Pulse:  91 86   Resp:  20 19   Temp:  98.2 F (36.8 C) 98 F (36.7 C)   TempSrc:  Oral Oral   SpO2: 98% 99% 100% 99%  Weight:      Height:        Intake/Output Summary (Last 24 hours)  at 11/11/2022 1712 Last data filed at 11/10/2022 1838 Gross per 24 hour  Intake 100 ml  Output 300 ml  Net -200 ml    Filed Weights   11/06/22 0524  Weight: 80.7 kg    Scheduled Meds:  sodium chloride   Intravenous Once   acetaminophen  650 mg Oral Q6H   aspirin  81 mg Oral Daily   bumetanide  1 mg Oral Daily   docusate sodium  100 mg Oral BID   enoxaparin (LOVENOX) injection  40 mg Subcutaneous Q24H   feeding supplement  237 mL Oral BID BM   isosorbide mononitrate  30 mg Oral Daily   metoprolol succinate  25 mg Oral Daily   mometasone-formoterol  2 puff Inhalation BID   multivitamin  1 tablet Oral QHS   pantoprazole  40 mg Oral Daily   polyethylene glycol  17 g Oral Daily   pravastatin  20 mg Oral Daily   Continuous Infusions:    Nutritional status Signs/Symptoms: estimated needs Interventions: Ensure Enlive (each supplement provides 350kcal and 20 grams of protein), MVI  Body mass index is 27.88 kg/m.  Data Reviewed:   CBC: Recent Labs  Lab 11/06/22 0535 11/07/22 0141 11/08/22 0439 11/08/22 1351 11/08/22 2143 11/09/22 0413 11/09/22 1341 11/09/22 2214 11/10/22 1101  WBC 11.6* 6.9 11.6*  --   --  8.0  --   --  7.0  NEUTROABS 8.6*  --   --   --   --   --   --   --  5.4  HGB 9.5* 9.8* 6.5*   < > 6.7* 7.3* 7.9* 7.1* 8.0*  HCT 29.8* 31.5* 19.8*   < > 20.3* 22.5* 23.6* 21.8* 24.5*  MCV 81.4 82.5 79.8*  --   --  78.9*  --   --  79.8*  PLT 279 186 204  --   --  162  --   --  207   < > = values in this interval not displayed.    Basic Metabolic Panel: Recent Labs  Lab 11/06/22 0535 11/07/22 0141 11/08/22 0439 11/09/22 0413  NA 140 132* 132* 132*  K 3.8 4.1 4.1 4.1  CL 104 97* 100 101  CO2 22 22 25 25   GLUCOSE 144* 149* 118* 95  BUN 21 27* 29* 25*  CREATININE 1.21* 1.43* 1.10* 0.88  CALCIUM 8.7* 7.9* 7.8* 7.4*  PHOS  --   --   --  3.0    GFR: Estimated Creatinine Clearance: 40.8 mL/min (by C-G formula based on SCr of 0.88 mg/dL). Liver Function  Tests: Recent Labs  Lab 11/09/22 0413  ALBUMIN 2.0*    No results for input(s): "LIPASE", "AMYLASE" in the last 168 hours. No results for input(s): "AMMONIA" in the last 168 hours. Coagulation Profile: Recent Labs  Lab 11/06/22 0535  INR 1.1    Cardiac Enzymes: No results for input(s): "CKTOTAL", "CKMB", "CKMBINDEX", "TROPONINI" in the last 168 hours. BNP (last 3 results) No results for input(s): "PROBNP" in the last 8760 hours. HbA1C: No results for input(s): "HGBA1C" in the last 72 hours. CBG: No results for input(s): "GLUCAP" in the last 168 hours. Lipid Profile: No results for input(s): "CHOL", "HDL", "LDLCALC", "TRIG", "CHOLHDL", "LDLDIRECT" in the last 72 hours. Thyroid Function Tests: No results for input(s): "TSH", "T4TOTAL", "FREET4", "T3FREE", "THYROIDAB" in the last 72 hours. Anemia Panel: No results for input(s): "VITAMINB12", "FOLATE", "FERRITIN", "TIBC", "IRON", "RETICCTPCT" in the last 72 hours. Sepsis Labs: No results for input(s): "PROCALCITON", "LATICACIDVEN" in the last 168 hours.  Recent Results (from the past 240 hour(s))  Surgical pcr screen     Status: None   Collection Time: 11/06/22 10:01 AM   Specimen: Nasal Mucosa; Nasal Swab  Result Value Ref Range Status   MRSA, PCR NEGATIVE NEGATIVE Final   Staphylococcus aureus NEGATIVE NEGATIVE Final    Comment: (NOTE) The Xpert SA Assay (FDA approved for NASAL specimens in patients 38 years of age and older), is one component of a comprehensive surveillance program. It is not intended to diagnose infection nor to guide or monitor treatment. Performed at [facility], [address] [place], [place] [address]          Radiology Studies: No results found.         LOS: 5 days   Time spent= 35 mins    Barnetta Chapel, MD [facility]  If 7PM-7AM, please contact night-coverage  11/11/2022, 5:12 PM

## 2022-11-12 LAB — BPAM RBC
ISSUE DATE / TIME: 202405061631
Unit Type and Rh: 5100

## 2022-11-12 LAB — CBC WITH DIFFERENTIAL/PLATELET
Abs Immature Granulocytes: 0.05 K/uL (ref 0.00–0.07)
Basophils Absolute: 0 K/uL (ref 0.0–0.1)
Basophils Relative: 1 %
Eosinophils Absolute: 0.2 K/uL (ref 0.0–0.5)
Eosinophils Relative: 3 %
HCT: 24.1 % — ABNORMAL LOW (ref 36.0–46.0)
Hemoglobin: 7.4 g/dL — ABNORMAL LOW (ref 12.0–15.0)
Immature Granulocytes: 1 %
Lymphocytes Relative: 17 %
Lymphs Abs: 1 K/uL (ref 0.7–4.0)
MCH: 25.5 pg — ABNORMAL LOW (ref 26.0–34.0)
MCHC: 30.7 g/dL (ref 30.0–36.0)
MCV: 83.1 fL (ref 80.0–100.0)
Monocytes Absolute: 0.7 K/uL (ref 0.1–1.0)
Monocytes Relative: 12 %
Neutro Abs: 4.1 K/uL (ref 1.7–7.7)
Neutrophils Relative %: 66 %
Platelets: 217 K/uL (ref 150–400)
RBC: 2.9 MIL/uL — ABNORMAL LOW (ref 3.87–5.11)
RDW: 18.6 % — ABNORMAL HIGH (ref 11.5–15.5)
WBC: 6.1 K/uL (ref 4.0–10.5)
nRBC: 0 % (ref 0.0–0.2)

## 2022-11-12 LAB — RENAL FUNCTION PANEL
Albumin: 1.9 g/dL — ABNORMAL LOW (ref 3.5–5.0)
Anion gap: 10 (ref 5–15)
BUN: 20 mg/dL (ref 8–23)
CO2: 22 mmol/L (ref 22–32)
Calcium: 7.5 mg/dL — ABNORMAL LOW (ref 8.9–10.3)
Chloride: 103 mmol/L (ref 98–111)
Creatinine, Ser: 0.91 mg/dL (ref 0.44–1.00)
GFR, Estimated: 57 mL/min — ABNORMAL LOW
Glucose, Bld: 107 mg/dL — ABNORMAL HIGH (ref 70–99)
Phosphorus: 2.6 mg/dL (ref 2.5–4.6)
Potassium: 4.1 mmol/L (ref 3.5–5.1)
Sodium: 135 mmol/L (ref 135–145)

## 2022-11-12 LAB — TYPE AND SCREEN

## 2022-11-12 LAB — MAGNESIUM: Magnesium: 1.9 mg/dL (ref 1.7–2.4)

## 2022-11-12 LAB — PREPARE RBC (CROSSMATCH)

## 2022-11-12 MED ORDER — POLYETHYLENE GLYCOL 3350 17 G PO PACK
34.0000 g | PACK | Freq: Every day | ORAL | Status: DC
  Administered 2022-11-13: 34 g via ORAL
  Filled 2022-11-12: qty 2

## 2022-11-12 MED ORDER — SODIUM CHLORIDE 0.9% IV SOLUTION: Freq: Once | INTRAVENOUS | Status: DC

## 2022-11-12 MED ORDER — LACTULOSE 10 GM/15ML PO SOLN
20.0000 g | Freq: Once | ORAL | Status: AC
  Administered 2022-11-12: 20 g via ORAL
  Filled 2022-11-12: qty 30

## 2022-11-12 NOTE — Telephone Encounter (Signed)
Pt last saw Dr Eden Emms 12/15/21, last labs 11/12/22 Creat 0.91, age 89, weight 80.7kg, based on specified criteria pt is on appropriate dosage of Eliquis 5mg  BID for afib.  Will refill rx.  Pt is currently admitted, per hospital note at 10/30/22 discharge Eliquis was on hold pending GI follow-up. Pt readmitted 11/06/22 for fracture of femur, looks like pt is pending SNF placement and Eliquis dosage has been reduced to 2.5mg  BID.  Will refuse 5mg  tablets at this point since pt going to SNF.

## 2022-11-12 NOTE — TOC Progression Note (Signed)
Transition of Care [facility]) - Progression Note    Patient Details  Name: Dana Morrison MRN: [id number] Date of Birth: [birth year]  [facility] CM/SW Contact  Lorri Frederick, LCSW Phone Number: 11/12/2022, 8:50 AM  Clinical Narrative:   SNF auth approved: [id number], [id number], 4 days: 5/5-5/8.    Expected Discharge Plan: Skilled Nursing Facility Barriers to Discharge: Continued Medical Work up, SNF Pending bed offer  Expected Discharge Plan and Services In-house Referral: Clinical Social Work   Post Acute Care Choice: Skilled Nursing Facility Living arrangements for the past 2 months: Independent Living Facility Risk manager)                                       Social Determinants of Health (SDOH) Interventions SDOH Screenings   Food Insecurity: No Food Insecurity (08/22/2022)  Housing: Low Risk  (08/22/2022)  Transportation Needs: Unknown (08/22/2022)  Utilities: Not At Risk (08/22/2022)  Tobacco Use: Medium Risk (11/07/2022)    Readmission Risk Interventions     No data to display

## 2022-11-12 NOTE — Care Management Important Message (Signed)
Important Message  Patient Details  Name: Dana Morrison MRN: [id number] Date of Birth: 1923-08-13   Medicare Important Message Given:  Yes     Sherilyn Banker 11/12/2022, 1:35 PM

## 2022-11-12 NOTE — Progress Notes (Signed)
PROGRESS NOTE    Dana Morrison  EAV:[id number] DOB: 08/17/1923 DOA: 11/06/2022 PCP: Charlane Ferretti, DO   Brief Narrative:  89 y.o. female with medical history significant of afib on Eliquis which has been on hold due to prior GI bleed, chronic diastolic CHF, CAD, and HTN presenting with a fall.  She was last admitted from 4/20-23 with acute on chronic diastolic CHF and prior admission in 08/2022 for R hip fracture and subsequent 09/2022 admission for GI bleeding.    She reports that she got up overnight and balanced herself on the arm of the chair and then fell to the floor, landing on her R hip.  Upon admission found to have periprosthetic fracture.  11/08/2022: Patient seen.  No new complaints.  Low blood pressure is noted.  Medications reviewed.  Cautious use of opiates.  Significant drop in H/H noted s/p transfusion of 1 unit of packed red blood cells.  Will continue to monitor H/H every 8 hours x 2.  Monitor blood pressure closely.  Further management depend on hospital course.  11/09/2022: Patient seen.  No new complaints.  Blood pressure is improving.  Systolic blood pressures in the 90s.  CBC done earlier today revealed hemoglobin of 7.3 g/dL.  Likely discharge tomorrow if patient's blood pressure remains stable and hemoglobin remains stable.  11/10/2022: Blood pressure is improving.  Hemoglobin this morning is 8 g/dL.  Awaiting insurance authorization.  Patient is stable for discharge to facility.  Patient's family updated.  11/11/2022: Patient seen.  Blood pressure is better today.  No labs today.  Will check labs in the morning.  Will restart patient's Bumex.  Will use IV Lasix 20 Mg as needed for significant shortness of breath.  Assessment & Plan:  Active Problems:   Paroxysmal atrial fibrillation (HCC)   Essential hypertension   Hyperlipidemia   Chronic combined systolic (congestive) and diastolic (congestive) heart failure (HCC)   Pacemaker   Closed right hip fracture, initial encounter  [facility])   Coronary artery disease   Chronic kidney disease, stage 3a (HCC)   Chronic cough   DNR (do not resuscitate)     Right hip periprosthetic fracture Status post IM nailing of the right femur and removal of deep implant 4/30 Right hip periprosthetic fracture, seen by orthopedic.  Pain control, postop recommendations, pain management and DVT prophylaxis per their service 11/08/2022: Orthopedic team is driving postop management.  Low blood pressure is noted.  Drop in H/H is noted.  Transfuse packed red blood cells as needed. 11/09/2022: Hemoglobin is 7.3 g/dL today.  Repeat hemoglobin tomorrow.  Systolic blood pressures in the 90s.  Monitor overnight and if patient's vitals remained stable, will likely discharge patient. 11/11/2022: Stable for discharge to facility.  Pursue disposition.  Awaiting insurance authorization. 5/6: insurance auth approved  Chronic combined heart failure, EF 40% with global hypokinesia -4/21 Echocardiogram with reduced LV systolic function 35 to 40%, global hypokinesis, no significant valvular disease, grade 1 diastolic dysfunction.  Overall appears to be compensated at this time.  Continue Toprol-XL, Imdur.  Hold Aldactone 11/08/2022: Low EF may be contributing to the low blood pressure/hypotension. 11/10/2022: Compensated. 11/11/2022: Resume Bumex 1 Mg p.o. once daily.  Use IV Lasix 20 Mg as needed for significant shortness of breath.   Paroxysmal atrial fibrillation Continue Toprol-XL.  Has pacemaker.  Anticoagulation stopped due to recent GI bleed, follow-up outpatient  CAD S/p stent - cautiously continuing aspirin - cont statin  Recent GI bleed Patient reports hematochezia at rehab, evaluated in  the ER and told "everything fine." Reports no further instances of bleeding and currently is constipated though has required several blood transfusions this hospitalization - monitor closely - cautiously continuing dvt ppx. Ortho advising apixaban at d/c  AKI on CKD  stage 3a Resolved  Essential hypertension -Continue Toprol XL, IV as needed   Chronic cough -Seen by pulm on 4/26 -CT with air trapping and minimal scarring, PFTs with air trapping -Bronchodilators, I-S/flutter valve  Anemia: -Likely blood loss anemia. -Hemoglobin on presentation was 9.8, dropped to 6.5 g/dL.  Has received 2 units. Today hgb 7.4. no stool for several days - will transfuse additional unite today  Constipation Reports no bm since admission - will escalate bowel regimen. Will increase miralax and add lactulose. Enema tomorrow if no bm today   DNR -per prior provider, "I have discussed code status with the patient and her son and they are in agreement that the patient would not desire resuscitation and would prefer to die a natural death should that situation arise."    DVT prophylaxis: Lovenox Code Status: DNR Family Communication:  none @ bedside Status is: Inpatient        Diet Orders (From admission, onward)     Start     Ordered   11/06/22 1437  Diet regular Room service appropriate? Yes; Fluid consistency: Thin  Diet effective now       Question Answer Comment  Room service appropriate? Yes   Fluid consistency: Thin      11/06/22 1436            Subjective: Patient seen. No new complaints. No fever or chills No chest pain.   Moderate pain right hip   Examination:  General exam: Appears calm and comfortable, elderly frail Respiratory system: Clear to auscultation.  Cardiovascular system: S1 & S2 heard. Gastrointestinal system: Abdomen is soft and nontender.   Central nervous system: Awake and alert.  No focal neurological deficits. Extremities: Mild ankle edema.  Objective: Vitals:   11/11/22 2143 11/12/22 0458 11/12/22 0738 11/12/22 0811  BP: (!) 105/57 (!) 110/58 110/69   Pulse: 86 88 91   Resp: 20 20 (!) 25   Temp: 98 F (36.7 C) 98 F (36.7 C) 97.9 F (36.6 C)   TempSrc: Oral Oral Oral   SpO2: 100% 99% 99% 98%   Weight:      Height:        Intake/Output Summary (Last 24 hours) at 11/12/2022 1125 Last data filed at 11/12/2022 0500 Gross per 24 hour  Intake --  Output 450 ml  Net -450 ml   Filed Weights   11/06/22 0524  Weight: 80.7 kg    Scheduled Meds:  sodium chloride   Intravenous Once   acetaminophen  650 mg Oral Q6H   aspirin  81 mg Oral Daily   bumetanide  1 mg Oral Daily   docusate sodium  100 mg Oral BID   enoxaparin (LOVENOX) injection  40 mg Subcutaneous Q24H   feeding supplement  237 mL Oral BID BM   isosorbide mononitrate  30 mg Oral Daily   metoprolol succinate  25 mg Oral Daily   mometasone-formoterol  2 puff Inhalation BID   multivitamin  1 tablet Oral QHS   pantoprazole  40 mg Oral Daily   polyethylene glycol  17 g Oral Daily   pravastatin  20 mg Oral Daily   Continuous Infusions:    Nutritional status Signs/Symptoms: estimated needs Interventions: Ensure Enlive (each supplement provides 350kcal and  20 grams of protein), MVI Body mass index is 27.88 kg/m.  Data Reviewed:   CBC: Recent Labs  Lab 11/06/22 0535 11/07/22 0141 11/08/22 0439 11/08/22 1351 11/09/22 0413 11/09/22 1341 11/09/22 2214 11/10/22 1101 11/12/22 0144  WBC 11.6* 6.9 11.6*  --  8.0  --   --  7.0 6.1  NEUTROABS 8.6*  --   --   --   --   --   --  5.4 4.1  HGB 9.5* 9.8* 6.5*   < > 7.3* 7.9* 7.1* 8.0* 7.4*  HCT 29.8* 31.5* 19.8*   < > 22.5* 23.6* 21.8* 24.5* 24.1*  MCV 81.4 82.5 79.8*  --  78.9*  --   --  79.8* 83.1  PLT 279 186 204  --  162  --   --  207 217   < > = values in this interval not displayed.   Basic Metabolic Panel: Recent Labs  Lab 11/06/22 0535 11/07/22 0141 11/08/22 0439 11/09/22 0413 11/12/22 0144  NA 140 132* 132* 132* 135  K 3.8 4.1 4.1 4.1 4.1  CL 104 97* 100 101 103  CO2 22 22 25 25 22   GLUCOSE 144* 149* 118* 95 107*  BUN 21 27* 29* 25* 20  CREATININE 1.21* 1.43* 1.10* 0.88 0.91  CALCIUM 8.7* 7.9* 7.8* 7.4* 7.5*  MG  --   --   --   --  1.9  PHOS   --   --   --  3.0 2.6   GFR: Estimated Creatinine Clearance: 38.6 mL/min (by C-G formula based on SCr of 0.91 mg/dL). Liver Function Tests: Recent Labs  Lab 11/09/22 0413 11/12/22 0144  ALBUMIN 2.0* 1.9*   No results for input(s): "LIPASE", "AMYLASE" in the last 168 hours. No results for input(s): "AMMONIA" in the last 168 hours. Coagulation Profile: Recent Labs  Lab 11/06/22 0535  INR 1.1   Cardiac Enzymes: No results for input(s): "CKTOTAL", "CKMB", "CKMBINDEX", "TROPONINI" in the last 168 hours. BNP (last 3 results) No results for input(s): "PROBNP" in the last 8760 hours. HbA1C: No results for input(s): "HGBA1C" in the last 72 hours. CBG: No results for input(s): "GLUCAP" in the last 168 hours. Lipid Profile: No results for input(s): "CHOL", "HDL", "LDLCALC", "TRIG", "CHOLHDL", "LDLDIRECT" in the last 72 hours. Thyroid Function Tests: No results for input(s): "TSH", "T4TOTAL", "FREET4", "T3FREE", "THYROIDAB" in the last 72 hours. Anemia Panel: No results for input(s): "VITAMINB12", "FOLATE", "FERRITIN", "TIBC", "IRON", "RETICCTPCT" in the last 72 hours. Sepsis Labs: No results for input(s): "PROCALCITON", "LATICACIDVEN" in the last 168 hours.  Recent Results (from the past 240 hour(s))  Surgical pcr screen     Status: None   Collection Time: 11/06/22 10:01 AM   Specimen: Nasal Mucosa; Nasal Swab  Result Value Ref Range Status   MRSA, PCR NEGATIVE NEGATIVE Final   Staphylococcus aureus NEGATIVE NEGATIVE Final    Comment: (NOTE) The Xpert SA Assay (FDA approved for NASAL specimens in patients 73 years of age and older), is one component of a comprehensive surveillance program. It is not intended to diagnose infection nor to guide or monitor treatment. Performed at [facility], [address] [place], [place] [address]          Radiology Studies: No results found.         LOS: 6 days   Time spent= 35 mins    Silvano Bilis, MD [facility]  If 7PM-7AM, please contact night-coverage  11/12/2022, 11:25 AM

## 2022-11-12 NOTE — Plan of Care (Signed)
  Problem: Coping: Goal: Level of anxiety will decrease Outcome: Progressing   Problem: Pain Managment: Goal: General experience of comfort will improve Outcome: Progressing   Problem: Activity: Goal: Ability to ambulate and perform ADLs will improve Outcome: Progressing

## 2022-11-13 DIAGNOSIS — S7221XD Displaced subtrochanteric fracture of right femur, subsequent encounter for closed fracture with routine healing: Secondary | ICD-10-CM | POA: Diagnosis not present

## 2022-11-13 DIAGNOSIS — I480 Paroxysmal atrial fibrillation: Secondary | ICD-10-CM

## 2022-11-13 DIAGNOSIS — G8929 Other chronic pain: Secondary | ICD-10-CM | POA: Diagnosis not present

## 2022-11-13 DIAGNOSIS — J9601 Acute respiratory failure with hypoxia: Secondary | ICD-10-CM | POA: Diagnosis not present

## 2022-11-13 DIAGNOSIS — N183 Chronic kidney disease, stage 3 unspecified: Secondary | ICD-10-CM | POA: Diagnosis not present

## 2022-11-13 DIAGNOSIS — R195 Other fecal abnormalities: Secondary | ICD-10-CM | POA: Diagnosis not present

## 2022-11-13 DIAGNOSIS — I82431 Acute embolism and thrombosis of right popliteal vein: Secondary | ICD-10-CM | POA: Diagnosis not present

## 2022-11-13 DIAGNOSIS — Z7401 Bed confinement status: Secondary | ICD-10-CM | POA: Diagnosis not present

## 2022-11-13 DIAGNOSIS — E1152 Type 2 diabetes mellitus with diabetic peripheral angiopathy with gangrene: Secondary | ICD-10-CM | POA: Diagnosis not present

## 2022-11-13 DIAGNOSIS — F4320 Adjustment disorder, unspecified: Secondary | ICD-10-CM | POA: Diagnosis not present

## 2022-11-13 DIAGNOSIS — D649 Anemia, unspecified: Secondary | ICD-10-CM | POA: Diagnosis not present

## 2022-11-13 DIAGNOSIS — R55 Syncope and collapse: Secondary | ICD-10-CM | POA: Diagnosis not present

## 2022-11-13 DIAGNOSIS — I469 Cardiac arrest, cause unspecified: Secondary | ICD-10-CM | POA: Diagnosis not present

## 2022-11-13 DIAGNOSIS — I7092 Chronic total occlusion of artery of the extremities: Secondary | ICD-10-CM | POA: Diagnosis not present

## 2022-11-13 DIAGNOSIS — I10 Essential (primary) hypertension: Secondary | ICD-10-CM | POA: Diagnosis not present

## 2022-11-13 DIAGNOSIS — M25551 Pain in right hip: Secondary | ICD-10-CM | POA: Diagnosis not present

## 2022-11-13 DIAGNOSIS — R404 Transient alteration of awareness: Secondary | ICD-10-CM | POA: Diagnosis not present

## 2022-11-13 DIAGNOSIS — S72001D Fracture of unspecified part of neck of right femur, subsequent encounter for closed fracture with routine healing: Secondary | ICD-10-CM | POA: Diagnosis not present

## 2022-11-13 DIAGNOSIS — T8484XD Pain due to internal orthopedic prosthetic devices, implants and grafts, subsequent encounter: Secondary | ICD-10-CM | POA: Diagnosis not present

## 2022-11-13 DIAGNOSIS — L89153 Pressure ulcer of sacral region, stage 3: Secondary | ICD-10-CM | POA: Diagnosis not present

## 2022-11-13 DIAGNOSIS — R5381 Other malaise: Secondary | ICD-10-CM | POA: Diagnosis not present

## 2022-11-13 DIAGNOSIS — F5101 Primary insomnia: Secondary | ICD-10-CM | POA: Diagnosis not present

## 2022-11-13 DIAGNOSIS — D638 Anemia in other chronic diseases classified elsewhere: Secondary | ICD-10-CM | POA: Diagnosis not present

## 2022-11-13 DIAGNOSIS — S728X1D Other fracture of right femur, subsequent encounter for closed fracture with routine healing: Secondary | ICD-10-CM | POA: Diagnosis not present

## 2022-11-13 DIAGNOSIS — I2510 Atherosclerotic heart disease of native coronary artery without angina pectoris: Secondary | ICD-10-CM | POA: Diagnosis not present

## 2022-11-13 DIAGNOSIS — I5022 Chronic systolic (congestive) heart failure: Secondary | ICD-10-CM | POA: Diagnosis not present

## 2022-11-13 DIAGNOSIS — I509 Heart failure, unspecified: Secondary | ICD-10-CM | POA: Diagnosis not present

## 2022-11-13 DIAGNOSIS — K922 Gastrointestinal hemorrhage, unspecified: Secondary | ICD-10-CM | POA: Diagnosis not present

## 2022-11-13 DIAGNOSIS — M79604 Pain in right leg: Secondary | ICD-10-CM | POA: Diagnosis not present

## 2022-11-13 DIAGNOSIS — S7291XA Unspecified fracture of right femur, initial encounter for closed fracture: Secondary | ICD-10-CM | POA: Diagnosis not present

## 2022-11-13 DIAGNOSIS — I959 Hypotension, unspecified: Secondary | ICD-10-CM | POA: Diagnosis not present

## 2022-11-13 DIAGNOSIS — E785 Hyperlipidemia, unspecified: Secondary | ICD-10-CM | POA: Diagnosis not present

## 2022-11-13 DIAGNOSIS — Z299 Encounter for prophylactic measures, unspecified: Secondary | ICD-10-CM | POA: Diagnosis not present

## 2022-11-13 DIAGNOSIS — I69392 Facial weakness following cerebral infarction: Secondary | ICD-10-CM | POA: Diagnosis not present

## 2022-11-13 DIAGNOSIS — Z4789 Encounter for other orthopedic aftercare: Secondary | ICD-10-CM | POA: Diagnosis not present

## 2022-11-13 DIAGNOSIS — I739 Peripheral vascular disease, unspecified: Secondary | ICD-10-CM | POA: Diagnosis not present

## 2022-11-13 LAB — CBC
HCT: 28.7 % — ABNORMAL LOW (ref 36.0–46.0)
Hemoglobin: 9.4 g/dL — ABNORMAL LOW (ref 12.0–15.0)
MCH: 26.6 pg (ref 26.0–34.0)
MCHC: 32.8 g/dL (ref 30.0–36.0)
MCV: 81.3 fL (ref 80.0–100.0)
Platelets: 241 10*3/uL (ref 150–400)
RBC: 3.53 MIL/uL — ABNORMAL LOW (ref 3.87–5.11)
RDW: 18 % — ABNORMAL HIGH (ref 11.5–15.5)
WBC: 6.6 10*3/uL (ref 4.0–10.5)
nRBC: 0.3 % — ABNORMAL HIGH (ref 0.0–0.2)

## 2022-11-13 LAB — TYPE AND SCREEN
ABO/RH(D): O POS
Antibody Screen: NEGATIVE
Unit division: 0

## 2022-11-13 LAB — BPAM RBC: Blood Product Expiration Date: 202406072359

## 2022-11-13 LAB — BASIC METABOLIC PANEL
Anion gap: 7 (ref 5–15)
BUN: 21 mg/dL (ref 8–23)
CO2: 26 mmol/L (ref 22–32)
Calcium: 7.9 mg/dL — ABNORMAL LOW (ref 8.9–10.3)
Chloride: 100 mmol/L (ref 98–111)
Creatinine, Ser: 0.74 mg/dL (ref 0.44–1.00)
GFR, Estimated: >60 mL/min (ref >60)
Glucose, Bld: 107 mg/dL — ABNORMAL HIGH (ref 70–99)
Potassium: 4 mmol/L (ref 3.5–5.1)
Sodium: 133 mmol/L — ABNORMAL LOW (ref 135–145)

## 2022-11-13 MED ORDER — POLYETHYLENE GLYCOL 3350 17 G PO PACK: 17.0000 g | PACK | Freq: Every day | ORAL | 0 refills | Status: AC

## 2022-11-13 MED ORDER — LOPERAMIDE HCL 2 MG PO CAPS: 2.0000 mg | ORAL_CAPSULE | Freq: Two times a day (BID) | ORAL | 0 refills | Status: AC | PRN

## 2022-11-13 MED ORDER — ASPIRIN 81 MG PO CHEW: 81.0000 mg | CHEWABLE_TABLET | Freq: Every day | ORAL | Status: AC

## 2022-11-13 MED ORDER — DOCUSATE SODIUM 100 MG PO CAPS: 100.0000 mg | ORAL_CAPSULE | Freq: Two times a day (BID) | ORAL | 0 refills | Status: AC

## 2022-11-13 NOTE — Plan of Care (Signed)
No episode of bowel movement all night. Scheduled po colace and prn dulcolax given.

## 2022-11-13 NOTE — Discharge Summary (Addendum)
Physician Discharge Summary   Dana Morrison ZOX:[id number] DOB: 1924-01-17 DOA: 11/06/2022  PCP: Charlane Ferretti, DO  Admit date: 11/06/2022 Discharge date: 11/13/2022  Admitted From: Home Disposition: SNR Discharging physician: Lewie Chamber, MD Barriers to discharge: none  Recommendations at discharge: Follow up with ortho Resume aspirin after Eliquis complete Check Hgb weekly for stability   Weightbearing TDWB R leg with assistance. Will likely advance at first post op visit  ROM/Activity ROM as tolerated R hip and knee Slowly increase activity level   Discharge Condition: stable CODE STATUS: DNR Diet recommendation:  Diet Orders (From admission, onward)     Start     Ordered   11/13/22 0000  Diet general        11/13/22 1041   11/06/22 1437  Diet regular Room service appropriate? Yes; Fluid consistency: Thin  Diet effective now       Question Answer Comment  Room service appropriate? Yes   Fluid consistency: Thin      11/06/22 1436            Hospital Course:  Right hip periprosthetic fracture Status post IM nailing of the right femur and removal of deep implant 4/30 Right hip periprosthetic fracture, seen by orthopedic.  Pain control, postop recommendations, pain management and DVT prophylaxis per their service 11/08/2022: Orthopedic team is driving postop management.  Low blood pressure is noted.  Drop in H/H is noted.  Transfuse packed red blood cells as needed. 11/09/2022: Hemoglobin is 7.3 g/dL.  Systolic blood pressures in the 90s.  5/6: insurance auth approved   Chronic combined heart failure, EF 40% with global hypokinesia -4/21 Echocardiogram with reduced LV systolic function 35 to 40%, global hypokinesis, no significant valvular disease, grade 1 diastolic dysfunction.  Overall appears to be compensated at this time.  Continue Toprol-XL, Imdur.  Resume aldactone at d/c 11/08/2022: Low EF may be contributing to the low blood pressure/hypotension. 11/10/2022:  Compensated. 11/11/2022: Resume Bumex 1 Mg p.o. once daily.    Paroxysmal atrial fibrillation Continue Toprol-XL.  Has pacemaker.  Anticoagulation stopped due to recent GI bleed, follow-up outpatient - low dose Eliquis continued at d/c for DVT ppx but needs to be monitored for tolerance - resume asa after Eliquis complete    CAD S/p stent - resume asa after Eliquis - cont statin   Recent GI bleed Patient reports hematochezia at rehab, evaluated in the ER and told "everything fine." Reports no further instances of bleeding; PRBC given for ABLA s/p surgery    AKI on CKD stage 3a Resolved   Essential hypertension -Continue Toprol XL, IV as needed   Chronic cough -Seen by pulm on 4/26 -CT with air trapping and minimal scarring, PFTs with air trapping   Anemia: -Likely acute blood loss anemia. - Hgb stable s/p transfusions   Constipation - continue bowel regimen as needed   DNR -per prior provider, "I have discussed code status with the patient and her son and they are in agreement that the patient would not desire resuscitation and would prefer to die a natural death should that situation arise."   Principal Diagnosis: Hip fracture [facility])  Discharge Diagnoses: Active Hospital Problems   Diagnosis Date Noted   Paroxysmal atrial fibrillation (HCC) 09/12/2017    Priority: 2.   Essential hypertension 09/12/2017    Priority: 3.   Hyperlipidemia 10/27/2022    Priority: 5.   Chronic kidney disease, stage 3a (HCC) 11/06/2022   Chronic cough 11/06/2022   DNR (do not resuscitate) 11/06/2022  Closed right hip fracture, initial encounter (HCC) 08/21/2022   Coronary artery disease 08/21/2022   Pacemaker 09/22/2020   Chronic combined systolic (congestive) and diastolic (congestive) heart failure (HCC) 09/12/2017    Resolved Hospital Problems   Diagnosis Date Noted Date Resolved   Hip fracture (HCC) 11/06/2022 11/06/2022     Discharge Instructions     Diet general   Complete  by: As directed    Increase activity slowly   Complete by: As directed       Allergies as of 11/13/2022       Reactions   Psyllium Diarrhea   Other    Per patient she has an intolerance to some legumes. She does not have a true allergy. Pt's son confirmed this as well.         Medication List     TAKE these medications    acetaminophen 325 MG tablet Commonly known as: TYLENOL Take 2 tablets (650 mg total) by mouth every 6 (six) hours as needed. What changed:  when to take this reasons to take this   apixaban 2.5 MG Tabs tablet Commonly known as: Eliquis Take 1 tablet (2.5 mg total) by mouth 2 (two) times daily for 21 days.   aspirin 81 MG chewable tablet Chew 1 tablet (81 mg total) by mouth daily. Resume after Eliquis complete Start taking on: Dec 04, 2022 What changed:  additional instructions These instructions start on Dec 04, 2022. If you are unsure what to do until then, ask your doctor or other care provider.   bumetanide 1 MG tablet Commonly known as: BUMEX Take 1 tablet (1 mg total) by mouth daily. Please take twice daily in case of weight gain 2 to 3 lbs in 24 hrs or 5 lbs in 7 days.   calcium-vitamin D 500-5 MG-MCG tablet Commonly known as: OSCAL WITH D Take 1 tablet by mouth daily with breakfast.   docusate sodium 100 MG capsule Commonly known as: COLACE Take 1 capsule (100 mg total) by mouth 2 (two) times daily.   feeding supplement Liqd Take 237 mLs by mouth 2 (two) times daily between meals.   fluticasone-salmeterol 100-50 MCG/ACT Aepb Commonly known as: Wixela Inhub Inhale 1 puff into the lungs 2 (two) times daily.   guaiFENesin-dextromethorphan 100-10 MG/5ML syrup Commonly known as: ROBITUSSIN DM Take 10 mLs by mouth every 6 (six) hours as needed for cough.   isosorbide mononitrate 30 MG 24 hr tablet Commonly known as: IMDUR TAKE 1 TABLET EVERY DAY   Jardiance 10 MG Tabs tablet Generic drug: empagliflozin Take 1 tablet (10 mg total)  by mouth daily.   loperamide 2 MG capsule Commonly known as: IMODIUM Take 1 capsule (2 mg total) by mouth 2 (two) times daily as needed for diarrhea or loose stools. What changed:  when to take this reasons to take this   metoprolol succinate 25 MG 24 hr tablet Commonly known as: TOPROL-XL TAKE 1 TABLET EVERY DAY   Multaq 400 MG tablet Generic drug: dronedarone TAKE 1 TABLET TWICE DAILY WITH MEALS   multivitamin capsule Take 1 capsule by mouth daily.   nitroGLYCERIN 0.4 MG SL tablet Commonly known as: NITROSTAT Place 0.4 mg under the tongue every 5 (five) minutes as needed for chest pain.   oxyCODONE 5 MG immediate release tablet Commonly known as: Oxy IR/ROXICODONE Take 1 tablet (5 mg total) by mouth every 4 (four) hours as needed for moderate pain or severe pain.   pantoprazole 40 MG tablet Commonly known as:  PROTONIX Take 1 tablet (40 mg total) by mouth daily.   polyethylene glycol 17 g packet Commonly known as: MIRALAX / GLYCOLAX Take 17 g by mouth daily. Start taking on: Nov 14, 2022   pravastatin 20 MG tablet Commonly known as: PRAVACHOL TAKE 1 TABLET EVERY DAY (NEED MD APPOINTMENT) What changed: See the new instructions.   PROBIOTIC PO Take 1 capsule by mouth daily.   spironolactone 25 MG tablet Commonly known as: ALDACTONE Take 1/2 tablet (12.5 mg total) by mouth daily.        Contact information for follow-up providers     Myrene Galas, MD. Schedule an appointment as soon as possible for a visit in 10 day(s).   Specialty: Orthopedic Surgery Contact information: [address] [place] [address] [phone]              Contact information for after-discharge care     Destination     [facility] .   Service: Skilled Nursing Contact information: [address] [place] [address] [phone]                    Allergies  Allergen Reactions   Psyllium Diarrhea   Other     Per patient  she has an intolerance to some legumes. She does not have a true allergy. Pt's son confirmed this as well.     Consultations: Ortho  Procedures: 11/06/22: PROCEDURES:   1. INTRAMEDULLARY NAILING OF THE RIGHT FEMUR with BIOMET AFFIXUS 11 X 400 mm, locked 2. REMOVAL OF DEEP IMPLAN  Discharge Exam: BP (!) 114/47   Pulse 98   Temp 98.1 F (36.7 C) (Oral)   Resp 18   Ht 5\' 7"  (1.702 m)   Wt 80.7 kg   SpO2 99%   BMI 27.88 kg/m  Physical Exam Constitutional:      General: She is not in acute distress.    Appearance: Normal appearance. She is not ill-appearing.  HENT:     Head: Normocephalic and atraumatic.     Mouth/Throat:     Mouth: Mucous membranes are moist.  Eyes:     Extraocular Movements: Extraocular movements intact.  Cardiovascular:     Rate and Rhythm: Normal rate and regular rhythm.  Pulmonary:     Effort: Pulmonary effort is normal. No respiratory distress.     Breath sounds: Normal breath sounds. No wheezing.  Abdominal:     General: Bowel sounds are normal. There is no distension.     Palpations: Abdomen is soft.     Tenderness: There is no abdominal tenderness.  Musculoskeletal:        General: No swelling. Normal range of motion.     Cervical back: Normal range of motion and neck supple.     Comments: Right hip dressings in place; compartments soft  Skin:    General: Skin is warm and dry.  Neurological:     General: No focal deficit present.     Mental Status: She is alert.  Psychiatric:        Mood and Affect: Mood normal.        Behavior: Behavior normal.      The results of significant diagnostics from this hospitalization (including imaging, microbiology, ancillary and laboratory) are listed below for reference.   Microbiology: Recent Results (from the past 240 hour(s))  Surgical pcr screen     Status: None   Collection Time: 11/06/22 10:01 AM   Specimen: Nasal Mucosa; Nasal  Swab  Result Value Ref Range Status   MRSA, PCR NEGATIVE NEGATIVE  Final   Staphylococcus aureus NEGATIVE NEGATIVE Final    Comment: (NOTE) The Xpert SA Assay (FDA approved for NASAL specimens in patients 3 years of age and older), is one component of a comprehensive surveillance program. It is not intended to diagnose infection nor to guide or monitor treatment. Performed at [facility], [address], [place], [place] [address]      Labs: BNP (last 3 results) Recent Labs    10/27/22 0233  BNP 386.6*   Basic Metabolic Panel: Recent Labs  Lab 11/07/22 0141 11/08/22 0439 11/09/22 0413 11/12/22 0144 11/13/22 0141  NA 132* 132* 132* 135 133*  K 4.1 4.1 4.1 4.1 4.0  CL 97* 100 101 103 100  CO2 22 25 25 22 26   GLUCOSE 149* 118* 95 107* 107*  BUN 27* 29* 25* 20 21  CREATININE 1.43* 1.10* 0.88 0.91 0.74  CALCIUM 7.9* 7.8* 7.4* 7.5* 7.9*  MG  --   --   --  1.9  --   PHOS  --   --  3.0 2.6  --    Liver Function Tests: Recent Labs  Lab 11/09/22 0413 11/12/22 0144  ALBUMIN 2.0* 1.9*   No results for input(s): "LIPASE", "AMYLASE" in the last 168 hours. No results for input(s): "AMMONIA" in the last 168 hours. CBC: Recent Labs  Lab 11/08/22 0439 11/08/22 1351 11/09/22 0413 11/09/22 1341 11/09/22 2214 11/10/22 1101 11/12/22 0144 11/13/22 0141  WBC 11.6*  --  8.0  --   --  7.0 6.1 6.6  NEUTROABS  --   --   --   --   --  5.4 4.1  --   HGB 6.5*   < > 7.3* 7.9* 7.1* 8.0* 7.4* 9.4*  HCT 19.8*   < > 22.5* 23.6* 21.8* 24.5* 24.1* 28.7*  MCV 79.8*  --  78.9*  --   --  79.8* 83.1 81.3  PLT 204  --  162  --   --  207 217 241   < > = values in this interval not displayed.   Cardiac Enzymes: No results for input(s): "CKTOTAL", "CKMB", "CKMBINDEX", "TROPONINI" in the last 168 hours. BNP: Invalid input(s): "POCBNP" CBG: No results for input(s): "GLUCAP" in the last 168 hours. D-Dimer No results for input(s): "DDIMER" in the last 72 hours. Hgb A1c No results for input(s): "HGBA1C" in the last 72 hours. Lipid Profile No  results for input(s): "CHOL", "HDL", "LDLCALC", "TRIG", "CHOLHDL", "LDLDIRECT" in the last 72 hours. Thyroid function studies No results for input(s): "TSH", "T4TOTAL", "T3FREE", "THYROIDAB" in the last 72 hours.  Invalid input(s): "FREET3" Anemia work up No results for input(s): "VITAMINB12", "FOLATE", "FERRITIN", "TIBC", "IRON", "RETICCTPCT" in the last 72 hours. Urinalysis No results found for: "COLORURINE", "APPEARANCEUR", "LABSPEC", "PHURINE", "GLUCOSEU", "HGBUR", "BILIRUBINUR", "KETONESUR", "PROTEINUR", "UROBILINOGEN", "NITRITE", "LEUKOCYTESUR" Sepsis Labs Recent Labs  Lab 11/09/22 0413 11/10/22 1101 11/12/22 0144 11/13/22 0141  WBC 8.0 7.0 6.1 6.6   Microbiology Recent Results (from the past 240 hour(s))  Surgical pcr screen     Status: None   Collection Time: 11/06/22 10:01 AM   Specimen: Nasal Mucosa; Nasal Swab  Result Value Ref Range Status   MRSA, PCR NEGATIVE NEGATIVE Final   Staphylococcus aureus NEGATIVE NEGATIVE Final    Comment: (NOTE) The Xpert SA Assay (FDA approved for NASAL specimens in patients 51 years of age and older), is one component of a comprehensive surveillance  program. It is not intended to diagnose infection nor to guide or monitor treatment. Performed at [facility], [address], [place], [place] [address]     Procedures/Studies: DG FEMUR PORT, MIN 2 VIEWS RIGHT  Result Date: 11/06/2022 CLINICAL DATA:  [phone] Fracture [phone] EXAM: RIGHT FEMUR PORTABLE 2 VIEW COMPARISON:  Earlier films of the same day FINDINGS: Interval IM rod and sliding screw fixation with 2 distal interlocking screws across proximal femoral shaft fracture, fragments in near anatomic alignment. Left knee arthroplasty hardware partially visualized. Stable fracture deformities of right ischiopubic rami. IMPRESSION: Interval ORIF of right femoral shaft fracture, fragments in near anatomic alignment. Electronically Signed   By: Corlis Leak M.D.   On: 11/06/2022 15:10    Pelvis Portable  Result Date: 11/06/2022 CLINICAL DATA:  Fracture EXAM: PORTABLE PELVIS 1-2 VIEWS COMPARISON:  Right femur x-ray 11/06/2022. Right hip x-ray 08/23/2022 FINDINGS: There is a new right femoral intramedullary nail and hip screw present fixating proximal femoral fracture. Alignment is anatomic. Left hip arthroplasty is also in anatomic alignment. There are healed right superior and inferior pubic rami fractures. There are moderate degenerative changes of the right hip. IMPRESSION: 1. Right femoral intramedullary nail and hip screw fixating proximal femoral fracture in anatomic alignment. Electronically Signed   By: Darliss Cheney M.D.   On: 11/06/2022 15:09   DG FEMUR, MIN 2 VIEWS RIGHT  Result Date: 11/06/2022 CLINICAL DATA:  Fixation of intertrochanteric and subtrochanteric femur fracture. EXAM: RIGHT FEMUR 2 VIEWS COMPARISON:  Radiographs 11/06/2022 FINDINGS: Multiple fluoroscopic spot images demonstrate interval removal of the cannulated hip screws and placement of a long intramedullary gamma nail and a proximal dynamic hip screw with 2 distal interlocking screws. Good position and alignment of the femur fracture without complicating features. IMPRESSION: Good position and alignment of the femur fracture with internal fixation hardware without complicating features. Electronically Signed   By: Rudie Meyer M.D.   On: 11/06/2022 13:13   DG C-Arm 1-60 Min-No Report  Result Date: 11/06/2022 Fluoroscopy was utilized by the requesting physician.  No radiographic interpretation.   DG C-Arm 1-60 Min-No Report  Result Date: 11/06/2022 Fluoroscopy was utilized by the requesting physician.  No radiographic interpretation.   DG FEMUR, MIN 2 VIEWS RIGHT  Result Date: 11/06/2022 CLINICAL DATA:  89 year old female with history of trauma from a fall. Leg deformity. EXAM: RIGHT FEMUR 2 VIEWS COMPARISON:  Right hip radiograph 08/23/2022. FINDINGS: Compared to the prior examination there has been  interval fracture of the right proximal femur, which appears to involve the intertrochanteric region and the proximal third of the right femoral diaphysis. This fracture is displaced and comminuted, with proximally 2 cm of medial displacement of the femoral diaphysis relative to the proximal fracture fragment. Three cannulated fixation screws are again noted in the right femoral neck. Old healed fractures of the right superior and inferior pubic rami are again noted. IMPRESSION: 1. Status post ORIF in the right femoral neck with interval development of a displaced comminuted fracture involving both the intertrochanteric regions in the proximal third of the right femoral diaphysis, as above. Electronically Signed   By: Trudie Reed M.D.   On: 11/06/2022 06:30   DG Chest Port 1 View  Result Date: 11/06/2022 CLINICAL DATA:  89 year old female with history of chest pain and shortness of breath. EXAM: PORTABLE CHEST 1 VIEW COMPARISON:  Chest x-ray 10/27/2022. FINDINGS: Lung volumes are slightly low. No acute consolidative airspace disease. No pleural effusions. No pneumothorax. No evidence of  pulmonary edema. Heart size is borderline enlarged. Upper mediastinal contours are within normal limits. Atherosclerotic calcifications are noted in the thoracic aorta. Left-sided pacemaker device in place with lead tips projecting over the expected location of the right atrium and right ventricle. IMPRESSION: 1. No radiographic evidence of acute cardiopulmonary disease. 2. Aortic atherosclerosis. Electronically Signed   By: Trudie Reed M.D.   On: 11/06/2022 06:28   DG Knee Right Port  Result Date: 11/06/2022 CLINICAL DATA:  89 year old female with history of fall. Legs deformity. EXAM: PORTABLE RIGHT KNEE - 1-2 VIEW COMPARISON:  No priors. FINDINGS: Single lateral view of the right knee demonstrates no definite acute displaced fracture. Degenerative changes of osteoarthritis are noted. IMPRESSION: 1. Limited  lateral view of the right knee demonstrates no definite acute posttraumatic findings. Electronically Signed   By: Trudie Reed M.D.   On: 11/06/2022 06:26   CT HEAD WO CONTRAST ( )  Result Date: 10/29/2022 CLINICAL DATA:  89 year old female TIA. Decompensated heart failure. Paroxysmal atrial fibrillation. EXAM: CT HEAD WITHOUT CONTRAST TECHNIQUE: Contiguous axial images were obtained from the base of the skull through the vertex without intravenous contrast. RADIATION DOSE REDUCTION: This exam was performed according to the departmental dose-optimization program which includes automated exposure control, adjustment of the mA and/or kV according to patient size and/or use of iterative reconstruction technique. COMPARISON:  Head CT 09/21/2022 [facility] FINDINGS: Brain: Cerebral volume is within normal limits for age. No midline shift, mass effect, or evidence of intracranial mass lesion. No ventriculomegaly. No acute intracranial hemorrhage identified. Small chronic right cerebellar infarct is stable. Patchy and confluent bilateral white matter hypodensity appears stable. No cortically based acute infarct identified. Vascular: No suspicious intracranial vascular hyperdensity. Calcified atherosclerosis at the skull base. Skull: No acute osseous abnormality identified. Sinuses/Orbits: Trace sphenoid sinus low-density fluid is stable. Mild right mastoid effusion is stable. Other paranasal sinuses and mastoids are well aerated. Other: Regressed left anterior scalp hematoma since last month, not fully resolved. Orbits soft tissues appears stable and negative. IMPRESSION: 1. No acute intracranial abnormality. Stable non contrast CT appearance of chronic small vessel disease since last month. 2. Regressed left anterior scalp hematoma. Electronically Signed   By: Odessa Fleming M.D.   On: 10/29/2022 08:48   ECHOCARDIOGRAM COMPLETE  Result Date: 10/28/2022    ECHOCARDIOGRAM  REPORT   Patient Name:   Dana Morrison Date of Exam: 10/28/2022 Medical Rec #:  [id number]      Height:       66.0 in Accession #:    [id number]     Weight:       192.9 lb Date of Birth:  05-Dec-1923       BSA:          1.969 m Patient Age:    89 years       BP:           108/52 mmHg Patient Gender: F              HR:           86 bpm. Exam Location:  Inpatient Procedure: 2D Echo, Cardiac Doppler, Color Doppler and Intracardiac            Opacification Agent Indications:    CHF-Acute Diastolic I50.31  History:        Patient has prior history of Echocardiogram examinations, most                 recent  09/20/2017. CHF, CAD, Pacemaker; Risk                 Factors:Dyslipidemia, Hypertension and Former Smoker.  Sonographer:    Dondra Prader RVT RCS Referring Phys: [id number] Angie Fava  Sonographer Comments: Technically difficult study due to poor echo windows, suboptimal parasternal window and suboptimal apical window. IMPRESSIONS  1. Left ventricular ejection fraction, by estimation, is 35 to 40%. The left ventricle has moderately decreased function. The left ventricle demonstrates global hypokinesis. Left ventricular diastolic parameters are consistent with Grade I diastolic dysfunction (impaired relaxation).  2. Right ventricular systolic function is normal. The right ventricular size is mildly enlarged. There is moderately elevated pulmonary artery systolic pressure. The estimated right ventricular systolic pressure is 53.4 mmHg.  3. Right atrial size was mildly dilated.  4. The mitral valve is grossly normal. Mild mitral valve regurgitation. No evidence of mitral stenosis.  5. The aortic valve was not well visualized. Aortic valve regurgitation is not visualized. No aortic stenosis is present.  6. The inferior vena cava is dilated in size with <50% respiratory variability, suggesting right atrial pressure of 15 mmHg. FINDINGS  Left Ventricle: Left ventricular ejection fraction, by estimation, is 35 to 40%. The left  ventricle has moderately decreased function. The left ventricle demonstrates global hypokinesis. Definity contrast agent was given IV to delineate the left ventricular endocardial borders. The left ventricular internal cavity size was normal in size. There is no left ventricular hypertrophy. Abnormal (paradoxical) septal motion, consistent with RV pacemaker. Left ventricular diastolic parameters are consistent with Grade I diastolic dysfunction (impaired relaxation). Right Ventricle: The right ventricular size is mildly enlarged. No increase in right ventricular wall thickness. Right ventricular systolic function is normal. There is moderately elevated pulmonary artery systolic pressure. The tricuspid regurgitant velocity is 3.10 m/s, and with an assumed right atrial pressure of 15 mmHg, the estimated right ventricular systolic pressure is 53.4 mmHg. Left Atrium: Left atrial size was normal in size. Right Atrium: Right atrial size was mildly dilated. Pericardium: There is no evidence of pericardial effusion. Mitral Valve: The mitral valve is grossly normal. Mild mitral valve regurgitation. No evidence of mitral valve stenosis. Tricuspid Valve: The tricuspid valve is normal in structure. Tricuspid valve regurgitation is mild . No evidence of tricuspid stenosis. Aortic Valve: The aortic valve was not well visualized. Aortic valve regurgitation is not visualized. No aortic stenosis is present. Aortic valve mean gradient measures 3.0 mmHg. Aortic valve peak gradient measures 5.5 mmHg. Aortic valve area, by VTI measures 3.04 cm. Pulmonic Valve: The pulmonic valve was not well visualized. Pulmonic valve regurgitation is trivial. No evidence of pulmonic stenosis. Aorta: The aortic root is normal in size and structure. Venous: The inferior vena cava is dilated in size with less than 50% respiratory variability, suggesting right atrial pressure of 15 mmHg. IAS/Shunts: No atrial level shunt detected by color flow Doppler.  Additional Comments: A device lead is visualized in the right atrium and right ventricle.  LEFT VENTRICLE PLAX 2D LVIDd:         4.80 cm   Diastology LVIDs:         3.50 cm   LV e' medial:    6.60 cm/s LV PW:         0.90 cm   LV E/e' medial:  16.4 LV IVS:        0.80 cm   LV e' lateral:   6.15 cm/s LVOT diam:     2.00 cm  LV E/e' lateral: 17.6 LV SV:         67 LV SV Index:   34 LVOT Area:     3.14 cm  RIGHT VENTRICLE             IVC RV Basal diam:  3.90 cm     IVC diam: 2.20 cm RV Mid diam:    3.20 cm RV S prime:     14.60 cm/s TAPSE (M-mode): 2.5 cm LEFT ATRIUM             Index        RIGHT ATRIUM           Index LA diam:        3.40 cm 1.73 cm/m   RA Area:     19.90 cm LA Vol (A2C):   68.2 ml 34.63 ml/m  RA Volume:   65.40 ml  33.21 ml/m LA Vol (A4C):   45.7 ml 23.21 ml/m LA Biplane Vol: 55.2 ml 28.03 ml/m  AORTIC VALVE                    PULMONIC VALVE AV Area (Vmax):    2.82 cm     PV Vmax:          1.02 m/s AV Area (Vmean):   3.12 cm     PV Peak grad:     4.2 mmHg AV Area (VTI):     3.04 cm     PR End Diast Vel: 4.16 msec AV Vmax:           117.00 cm/s AV Vmean:          73.400 cm/s AV VTI:            0.221 m AV Peak Grad:      5.5 mmHg AV Mean Grad:      3.0 mmHg LVOT Vmax:         105.00 cm/s LVOT Vmean:        72.800 cm/s LVOT VTI:          0.214 m LVOT/AV VTI ratio: 0.97  AORTA Ao Root diam: 3.00 cm Ao Asc diam:  3.60 cm Ao Arch diam: 2.9 cm MITRAL VALVE                TRICUSPID VALVE MV Area (PHT): 2.55 cm     TR Peak grad:   38.4 mmHg MV Decel Time: 298 msec     TR Vmax:        310.00 cm/s MR Peak grad: 72.6 mmHg MR Mean grad: 46.0 mmHg     SHUNTS MR Vmax:      426.00 cm/s   Systemic VTI:  0.21 m MR Vmean:     314.0 cm/s    Systemic Diam: 2.00 cm MV E velocity: 108.00 cm/s MV A velocity: 128.00 cm/s MV E/A ratio:  0.84 Weston Brass MD Electronically signed by Weston Brass MD Signature Date/Time: 10/28/2022/5:15:31 PM    Final    DG Chest Portable 1 View  Result Date:  10/27/2022 CLINICAL DATA:  Dyspnea EXAM: PORTABLE CHEST 1 VIEW COMPARISON:  08/27/2022 FINDINGS: The lungs are symmetrically well expanded. Superimposed extensive perihilar airspace infiltrate has developed in keeping with alveolar pulmonary edema or diffuse pneumonic infiltrate. No pneumothorax or pleural effusion. Cardiac size is within normal limits. Left subclavian dual lead pacemaker is unchanged. No acute bone abnormality. IMPRESSION: 1. Interval development of extensive perihilar airspace infiltrate in keeping with alveolar pulmonary edema  or diffuse pneumonic infiltrate. Electronically Signed   By: Helyn Numbers M.D.   On: 10/27/2022 02:57     Time coordinating discharge: Over 30 minutes    Lewie Chamber, MD  [facility] 11/13/2022, 10:59 AM

## 2022-11-13 NOTE — Progress Notes (Signed)
Physical Therapy Treatment Patient Details Name: Dana Morrison MRN: [id number] DOB: [birth year] Today's Date: 11/13/2022   History of Present Illness Pt is a 89 y.o. female presenting 4/30 after a fall at her [facility] with resulting R hip pain. Now s/p intramedullary nailing R femur and removal of deep implant. Recent admission for R femoral neck fracture and ORIF for management. PMHx:  osteoporosis, HOH, a-fib, CHFN CAD, low vision, MI HTN, OA hip and knee, urticaria, venous insufficiency    PT Comments    Pt was received in supine and agreeable to session. Session focused on lateral transfer to recliner and maintaining RLE NWB. Pt continuing to require increased time due to pain and DOE. Pt demonstrating difficulty with elevating bottom to scoot using LLE and BUEs. Pt able to perform lateral transfer halfway to recliner with min A to elevate RLE, however pt noted to have bowel incontinence. Pt requiring max A +2 to squat pivot back to bed for pericare and bedpan placement. Pt requiring cues for RLE TDWB throughout. Pt continues to benefit from PT services to progress toward functional mobility goals.    Recommendations for follow up therapy are one component of a multi-disciplinary discharge planning process, led by the attending physician.  Recommendations may be updated based on patient status, additional functional criteria and insurance authorization.     Assistance Recommended at Discharge Frequent or constant Supervision/Assistance  Patient can return home with the following Two people to help with walking and/or transfers;Two people to help with bathing/dressing/bathroom;Help with stairs or ramp for entrance;Assist for transportation;Assistance with cooking/housework   Equipment Recommendations  None recommended by PT    Recommendations for Other Services       Precautions / Restrictions Precautions Precautions: Fall Restrictions Weight Bearing Restrictions: Yes RLE Weight Bearing:  Touchdown weight bearing     Mobility  Bed Mobility Overal bed mobility: Needs Assistance Bed Mobility: Supine to Sit, Sit to Supine, Rolling Rolling: Min assist   Supine to sit: Min assist Sit to supine: Mod assist   General bed mobility comments: Min A for RLE advancement to EOB and mod A to elevate BLE to EOB due to pain. Min A to roll L/R.    Transfers Overall transfer level: Needs assistance Equipment used: None Transfers: Bed to chair/wheelchair/BSC       Squat pivot transfers: Max assist, +2 physical assistance    Lateral/Scoot Transfers: Min assist General transfer comment: Min A for RLE elevation to prevent WB and max A +2 to pivot back to bed due to BM. Pt with difficulty lifting bottom to scoot requiring cues for technique and head-hips relationship. Pt requiring frequent rest breaks due to DOE.       Balance Overall balance assessment: Needs assistance Sitting-balance support: Bilateral upper extremity supported, Feet supported Sitting balance-Leahy Scale: Fair Sitting balance - Comments: sitting EOB, requires UE support to maintain balance due to pain                                    Cognition Arousal/Alertness: Awake/alert Behavior During Therapy: WFL for tasks assessed/performed Overall Cognitive Status: Within Functional Limits for tasks assessed                                          Exercises      General Comments General  comments (skin integrity, edema, etc.): SpO2 WFL on RA      Pertinent Vitals/Pain Pain Assessment Pain Assessment: Faces Faces Pain Scale: Hurts even more Pain Location: R hip with mobility Pain Descriptors / Indicators: Aching, Sore, Operative site guarding, Crying Pain Intervention(s): Limited activity within patient's tolerance, Monitored during session, Repositioned     PT Goals (current goals can now be found in the care plan section) Acute Rehab PT Goals Patient Stated Goal:  decreased pain PT Goal Formulation: With patient Time For Goal Achievement: 11/21/22 Potential to Achieve Goals: Fair Progress towards PT goals: Progressing toward goals    Frequency    Min 3X/week      PT Plan Current plan remains appropriate       AM-PAC PT "6 Clicks" Mobility   Outcome Measure  Help needed turning from your back to your side while in a flat bed without using bedrails?: A Little Help needed moving from lying on your back to sitting on the side of a flat bed without using bedrails?: A Little Help needed moving to and from a bed to a chair (including a wheelchair)?: A Lot Help needed standing up from a chair using your arms (e.g., wheelchair or bedside chair)?: Total Help needed to walk in hospital room?: Total Help needed climbing 3-5 steps with a railing? : Total 6 Click Score: 11    End of Session Equipment Utilized During Treatment: Gait belt Activity Tolerance: Patient limited by pain Patient left: with call bell/phone within reach;in bed Nurse Communication: Mobility status PT Visit Diagnosis: Pain;Difficulty in walking, not elsewhere classified (R26.2);Other abnormalities of gait and mobility (R26.89) Pain - Right/Left: Right Pain - part of body: Hip;Leg     Time: 1610-9604 PT Time Calculation (min) (ACUTE ONLY): 38 min  Charges:  $Therapeutic Activity: 38-52 mins                     Johny Shock, PTA Acute Rehabilitation Services Secure Chat Preferred  [phone]    Johny Shock 11/13/2022, 9:18 AM

## 2022-11-13 NOTE — Progress Notes (Signed)
Report called in to [facility] in [place]. All questions answered.

## 2022-11-13 NOTE — Progress Notes (Signed)
Remote pacemaker transmission.   

## 2022-11-13 NOTE — TOC Progression Note (Signed)
Transition of Care [facility]) - Progression Note    Patient Details  Name: Dana Morrison MRN: [id number] Date of Birth: 07-Jul-1924  [facility] Care [facility]) CM/SW Contact  Lorri Frederick, LCSW Phone Number: 11/13/2022, 10:52 AM  Clinical Narrative:   CSW confirmed with [facility] that they can receive pt today. MD aware.     Expected Discharge Plan: Skilled Nursing Facility Barriers to Discharge: Continued Medical Work up, SNF Pending bed offer  Expected Discharge Plan and Services In-house Referral: Clinical Social Work   Post Acute Care Choice: Skilled Nursing Facility Living arrangements for the past 2 months: Independent Living Facility Risk manager) Expected Discharge Date: 11/13/22                                     Social Determinants of Health (SDOH) Interventions SDOH Screenings   Food Insecurity: No Food Insecurity (08/22/2022)  Housing: Low Risk  (08/22/2022)  Transportation Needs: Unknown (08/22/2022)  Utilities: Not At Risk (08/22/2022)  Tobacco Use: Medium Risk (11/07/2022)    Readmission Risk Interventions     No data to display

## 2022-11-13 NOTE — Progress Notes (Signed)
   11/13/22 1001  Assess: MEWS Score  BP (!) 114/47  MAP (mmHg) 67  Pulse Rate 98  SpO2 99 %  Assess: MEWS Score  MEWS Temp 0  MEWS Systolic 0  MEWS Pulse 0  MEWS RR 0  MEWS LOC 0  MEWS Score 0  MEWS Score Color Green  Assess: SIRS CRITERIA  SIRS Temperature  0  SIRS Pulse 1  SIRS Respirations  0  SIRS WBC 0  SIRS Score Sum  1

## 2022-11-13 NOTE — Progress Notes (Signed)
All personal belongings were returned. Patient informed of transfer.

## 2022-11-13 NOTE — TOC Transition Note (Addendum)
Transition of Care [facility]) - CM/SW Discharge Note   Patient Details  Name: Temperance Kundrat MRN: [id number] Date of Birth: May 15, 1924  Transition of [facility]) CM/SW Contact:  Lorri Frederick, LCSW Phone Number: 11/13/2022, 11:46 AM   Clinical Narrative:   [facility], room [address]  .  RN call report to [phone]. (Main number: [phone])    Final next level of care: Skilled Nursing Facility Barriers to Discharge: Barriers Resolved   Patient Goals and CMS Choice CMS Medicare.gov Compare Post Acute Care list provided to:: Patient Choice offered to / list presented to : Patient  Discharge Placement                [facility]) Patient to be transferred to facility by: [facility] Name of family member notified: both sons in room Patient and family notified of of transfer: 11/13/22  Discharge Plan and Services Additional resources added to the After Visit Summary for   In-house Referral: Clinical Social Work   Post Acute Care Choice: Skilled Nursing Facility                               Social Determinants of Health (SDOH) Interventions SDOH Screenings   Food Insecurity: No Food Insecurity (08/22/2022)  Housing: Low Risk  (08/22/2022)  Transportation Needs: Unknown (08/22/2022)  Utilities: Not At Risk (08/22/2022)  Tobacco Use: Medium Risk (11/07/2022)     Readmission Risk Interventions     No data to display

## 2022-11-15 DIAGNOSIS — I10 Essential (primary) hypertension: Secondary | ICD-10-CM | POA: Diagnosis not present

## 2022-11-15 DIAGNOSIS — I2510 Atherosclerotic heart disease of native coronary artery without angina pectoris: Secondary | ICD-10-CM | POA: Diagnosis not present

## 2022-11-15 DIAGNOSIS — I480 Paroxysmal atrial fibrillation: Secondary | ICD-10-CM | POA: Diagnosis not present

## 2022-11-15 DIAGNOSIS — K922 Gastrointestinal hemorrhage, unspecified: Secondary | ICD-10-CM | POA: Diagnosis not present

## 2022-11-15 DIAGNOSIS — S7291XA Unspecified fracture of right femur, initial encounter for closed fracture: Secondary | ICD-10-CM | POA: Diagnosis not present

## 2022-11-22 DIAGNOSIS — R195 Other fecal abnormalities: Secondary | ICD-10-CM | POA: Diagnosis not present

## 2022-11-23 DIAGNOSIS — G8929 Other chronic pain: Secondary | ICD-10-CM | POA: Diagnosis not present

## 2022-11-23 DIAGNOSIS — D649 Anemia, unspecified: Secondary | ICD-10-CM | POA: Diagnosis not present

## 2022-11-26 DIAGNOSIS — L89153 Pressure ulcer of sacral region, stage 3: Secondary | ICD-10-CM | POA: Diagnosis not present

## 2022-11-26 DIAGNOSIS — D638 Anemia in other chronic diseases classified elsewhere: Secondary | ICD-10-CM | POA: Diagnosis not present

## 2022-11-26 DIAGNOSIS — Z299 Encounter for prophylactic measures, unspecified: Secondary | ICD-10-CM | POA: Diagnosis not present

## 2022-11-26 DIAGNOSIS — I5022 Chronic systolic (congestive) heart failure: Secondary | ICD-10-CM | POA: Diagnosis not present

## 2022-11-30 DIAGNOSIS — K922 Gastrointestinal hemorrhage, unspecified: Secondary | ICD-10-CM | POA: Diagnosis not present

## 2022-11-30 DIAGNOSIS — G8929 Other chronic pain: Secondary | ICD-10-CM | POA: Diagnosis not present

## 2022-11-30 DIAGNOSIS — I2510 Atherosclerotic heart disease of native coronary artery without angina pectoris: Secondary | ICD-10-CM | POA: Diagnosis not present

## 2022-11-30 DIAGNOSIS — E785 Hyperlipidemia, unspecified: Secondary | ICD-10-CM | POA: Diagnosis not present

## 2022-11-30 DIAGNOSIS — S7291XA Unspecified fracture of right femur, initial encounter for closed fracture: Secondary | ICD-10-CM | POA: Diagnosis not present

## 2022-11-30 DIAGNOSIS — R195 Other fecal abnormalities: Secondary | ICD-10-CM | POA: Diagnosis not present

## 2022-11-30 DIAGNOSIS — I509 Heart failure, unspecified: Secondary | ICD-10-CM | POA: Diagnosis not present

## 2022-11-30 DIAGNOSIS — I480 Paroxysmal atrial fibrillation: Secondary | ICD-10-CM | POA: Diagnosis not present

## 2022-11-30 DIAGNOSIS — R5381 Other malaise: Secondary | ICD-10-CM | POA: Diagnosis not present

## 2022-12-03 DIAGNOSIS — D638 Anemia in other chronic diseases classified elsewhere: Secondary | ICD-10-CM | POA: Diagnosis not present

## 2022-12-03 DIAGNOSIS — L89153 Pressure ulcer of sacral region, stage 3: Secondary | ICD-10-CM | POA: Diagnosis not present

## 2022-12-03 DIAGNOSIS — I5022 Chronic systolic (congestive) heart failure: Secondary | ICD-10-CM | POA: Diagnosis not present

## 2022-12-08 DIAGNOSIS — 419620001 Death: Secondary | SNOMED CT | POA: Diagnosis not present

## 2022-12-08 DEATH — deceased

## 2022-12-10 DIAGNOSIS — I5022 Chronic systolic (congestive) heart failure: Secondary | ICD-10-CM | POA: Diagnosis not present

## 2022-12-10 DIAGNOSIS — L89153 Pressure ulcer of sacral region, stage 3: Secondary | ICD-10-CM | POA: Diagnosis not present

## 2022-12-10 DIAGNOSIS — D638 Anemia in other chronic diseases classified elsewhere: Secondary | ICD-10-CM | POA: Diagnosis not present

## 2022-12-11 DIAGNOSIS — G8929 Other chronic pain: Secondary | ICD-10-CM | POA: Diagnosis not present

## 2022-12-11 DIAGNOSIS — S7291XA Unspecified fracture of right femur, initial encounter for closed fracture: Secondary | ICD-10-CM | POA: Diagnosis not present

## 2022-12-11 DIAGNOSIS — R5381 Other malaise: Secondary | ICD-10-CM | POA: Diagnosis not present

## 2022-12-11 DIAGNOSIS — I10 Essential (primary) hypertension: Secondary | ICD-10-CM | POA: Diagnosis not present

## 2022-12-11 DIAGNOSIS — I509 Heart failure, unspecified: Secondary | ICD-10-CM | POA: Diagnosis not present

## 2022-12-11 DIAGNOSIS — I480 Paroxysmal atrial fibrillation: Secondary | ICD-10-CM | POA: Diagnosis not present

## 2022-12-11 DIAGNOSIS — E785 Hyperlipidemia, unspecified: Secondary | ICD-10-CM | POA: Diagnosis not present

## 2022-12-11 DIAGNOSIS — R195 Other fecal abnormalities: Secondary | ICD-10-CM | POA: Diagnosis not present

## 2022-12-11 DIAGNOSIS — J9601 Acute respiratory failure with hypoxia: Secondary | ICD-10-CM | POA: Diagnosis not present

## 2022-12-12 ENCOUNTER — Other Ambulatory Visit: Payer: Self-pay | Admitting: Cardiovascular Disease

## 2022-12-12 DIAGNOSIS — T8484XD Pain due to internal orthopedic prosthetic devices, implants and grafts, subsequent encounter: Secondary | ICD-10-CM | POA: Diagnosis not present

## 2022-12-12 DIAGNOSIS — S7221XD Displaced subtrochanteric fracture of right femur, subsequent encounter for closed fracture with routine healing: Secondary | ICD-10-CM | POA: Diagnosis not present

## 2022-12-14 DIAGNOSIS — I2510 Atherosclerotic heart disease of native coronary artery without angina pectoris: Secondary | ICD-10-CM | POA: Diagnosis not present

## 2022-12-14 DIAGNOSIS — I480 Paroxysmal atrial fibrillation: Secondary | ICD-10-CM | POA: Diagnosis not present

## 2022-12-14 DIAGNOSIS — G8929 Other chronic pain: Secondary | ICD-10-CM | POA: Diagnosis not present

## 2022-12-14 DIAGNOSIS — I10 Essential (primary) hypertension: Secondary | ICD-10-CM | POA: Diagnosis not present

## 2022-12-14 DIAGNOSIS — K922 Gastrointestinal hemorrhage, unspecified: Secondary | ICD-10-CM | POA: Diagnosis not present

## 2022-12-14 DIAGNOSIS — S7291XA Unspecified fracture of right femur, initial encounter for closed fracture: Secondary | ICD-10-CM | POA: Diagnosis not present

## 2023-01-07 DEATH — deceased

## 2023-10-02 ENCOUNTER — Other Ambulatory Visit (HOSPITAL_BASED_OUTPATIENT_CLINIC_OR_DEPARTMENT_OTHER): Payer: Self-pay

## 2024-01-09 ENCOUNTER — Other Ambulatory Visit (HOSPITAL_BASED_OUTPATIENT_CLINIC_OR_DEPARTMENT_OTHER): Payer: Self-pay
